# Patient Record
Sex: Female | Born: 2000 | Hispanic: Yes | Marital: Single | Smoking: Never smoker
Health system: Southern US, Community
[De-identification: ages and names within clinical notes are randomized; demographics above are authoritative.]

## PROBLEM LIST (undated history)

## (undated) ENCOUNTER — Inpatient Hospital Stay (HOSPITAL_COMMUNITY): Payer: Self-pay

## (undated) DIAGNOSIS — D649 Anemia, unspecified: Secondary | ICD-10-CM

## (undated) DIAGNOSIS — Z789 Other specified health status: Secondary | ICD-10-CM

## (undated) DIAGNOSIS — Z3490 Encounter for supervision of normal pregnancy, unspecified, unspecified trimester: Secondary | ICD-10-CM

## (undated) HISTORY — PX: NO PAST SURGERIES: SHX2092

## (undated) HISTORY — DX: Anemia, unspecified: D64.9

---
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
## 2018-11-27 ENCOUNTER — Other Ambulatory Visit: Payer: Self-pay

## 2018-11-27 ENCOUNTER — Encounter (HOSPITAL_COMMUNITY): Payer: Self-pay | Admitting: Emergency Medicine

## 2018-11-27 ENCOUNTER — Ambulatory Visit (HOSPITAL_COMMUNITY)
Admission: EM | Admit: 2018-11-27 | Discharge: 2018-11-27 | Disposition: A | Discharge status: Home / Self Care | Payer: Medicaid Other | Attending: Internal Medicine | Admitting: Internal Medicine

## 2018-11-27 DIAGNOSIS — A084 Viral intestinal infection, unspecified: Secondary | ICD-10-CM

## 2018-11-27 MED ORDER — ONDANSETRON 4 MG PO TBDP: 4.0000 mg | ORAL_TABLET | Freq: Three times a day (TID) | ORAL | 0 refills | Status: DC | PRN

## 2018-11-27 NOTE — ED Triage Notes (Signed)
Seen by dr Leonides Grills prior to clinical staff

## 2018-11-27 NOTE — ED Provider Notes (Signed)
[facility]    CSN: [id number] Arrival date & time: 11/27/18  1019     History   Chief Complaint Chief Complaint  Patient presents with  . Vomiting  . Diarrhea    HPI Catherine Nixon is a 18 y.o. female comes to urgent care with complaints of nausea, vomiting and diarrhea of 2 days duration.  Vomiting was nonbilious/nonbloody.  Diarrhea was nonbloody.  It was associated with some abdominal pain.  No fever or chills.  No relieving factors.  HPI  Past Medical History:  Diagnosis Date  . Medical history non-contributory     Patient Active Problem List   Diagnosis Date Noted  . Vaginal delivery 10/13/2017  . Post term pregnancy at [redacted] weeks gestation 10/12/2017    Past Surgical History:  Procedure Laterality Date  . NO PAST SURGERIES      OB History    Gravida  1   Para  1   Term  1   Preterm      AB      Living  1     SAB      TAB      Ectopic      Multiple  0   Live Births  1            Home Medications    Prior to Admission medications   Medication Sig Start Date End Date Taking? Authorizing Provider  ibuprofen (ADVIL,MOTRIN) 600 MG tablet Take 1 tablet (600 mg total) by mouth every 6 (six) hours. 10/15/17   Freddrick March, MD  IRON PO Take 1 tablet by mouth daily.     [provider]  ondansetron (ZOFRAN ODT) 4 MG disintegrating tablet Take 1 tablet (4 mg total) by mouth every 8 (eight) hours as needed for nausea or vomiting. 11/27/18   Blaiden Werth, Britta Mccreedy, MD    Family History Family History  Problem Relation Age of Onset  . Asthma Neg Hx   . Diabetes Neg Hx   . Heart disease Neg Hx   . Hypertension Neg Hx   . Stroke Neg Hx     Social History Social History   Tobacco Use  . Smoking status: Never Smoker  . Smokeless tobacco: Never Used  Substance Use Topics  . Alcohol use: No  . Drug use: No     Allergies   Patient has no known allergies.   Review of Systems Review of Systems  Eyes: Negative for  discharge, redness and itching.  Gastrointestinal: Positive for abdominal pain, diarrhea, nausea and vomiting. Negative for abdominal distention and constipation.  Genitourinary: Negative for dysuria, hematuria and urgency.  Musculoskeletal: Negative for arthralgias, gait problem and myalgias.  Neurological: Negative for dizziness, numbness and headaches.     Physical Exam Triage Vital Signs ED Triage Vitals  Enc Vitals Group     BP      Pulse      Resp      Temp      Temp src      SpO2      Weight      Height      Head Circumference      Peak Flow      Pain Score      Pain Loc      Pain Edu?      Excl. in GC?    No data found.  Updated Vital Signs BP (!) 94/56 (BP Location: Right Arm)   Pulse (!) 52  Temp 97.8 F (36.6 C) (Oral)   Resp 18   SpO2 100%   Visual Acuity Right Eye Distance:   Left Eye Distance:   Bilateral Distance:    Right Eye Near:   Left Eye Near:    Bilateral Near:     Physical Exam Cardiovascular:     Rate and Rhythm: Normal rate and regular rhythm.     Pulses: Normal pulses.     Heart sounds: Normal heart sounds.  Pulmonary:     Effort: Pulmonary effort is normal.     Breath sounds: Normal breath sounds.  Abdominal:     General: Abdomen is flat. There is no distension.     Tenderness: There is no abdominal tenderness. There is no guarding.  Musculoskeletal: Normal range of motion.  Skin:    General: Skin is warm.     Capillary Refill: Capillary refill takes less than 2 seconds.     Findings: No bruising, erythema or rash.      UC Treatments / Results  Labs (all labs ordered are listed, but only abnormal results are displayed) Labs Reviewed - No data to display  EKG None  Radiology No results found.  Procedures Procedures (including critical care time)  Medications Ordered in UC Medications - No data to display  Initial Impression / Assessment and Plan / UC Course  I have reviewed the triage vital signs and the  nursing notes.  Pertinent labs & imaging results that were available during my care of the patient were reviewed by me and considered in my medical decision making (see chart for details).     1.  Gastroenteritis-likely viral Zofran as needed for nausea or vomiting Encourage oral fluid intake   Final Clinical Impressions(s) / UC Diagnoses   Final diagnoses:  Viral gastroenteritis   Discharge Instructions   None    ED Prescriptions    Medication Sig Dispense Auth. Provider   ondansetron (ZOFRAN ODT) 4 MG disintegrating tablet  (Status: Discontinued) Take 1 tablet (4 mg total) by mouth every 8 (eight) hours as needed for nausea or vomiting. 20 tablet Chakita Mcgraw, Britta Mccreedy, MD   ondansetron (ZOFRAN ODT) 4 MG disintegrating tablet Take 1 tablet (4 mg total) by mouth every 8 (eight) hours as needed for nausea or vomiting. 20 tablet Lucetta Baehr, Britta Mccreedy, MD     Controlled Substance Prescriptions [place] Controlled Substance Registry consulted? Not Applicable   Merrilee Jansky, MD 11/27/18 [phone]

## 2019-06-18 ENCOUNTER — Ambulatory Visit (HOSPITAL_COMMUNITY)
Admission: EM | Admit: 2019-06-18 | Discharge: 2019-06-18 | Disposition: A | Discharge status: Home / Self Care | Payer: Medicaid Other | Attending: Nurse Practitioner | Admitting: Nurse Practitioner

## 2019-06-18 ENCOUNTER — Encounter (HOSPITAL_COMMUNITY): Payer: Self-pay

## 2019-06-18 ENCOUNTER — Telehealth (HOSPITAL_COMMUNITY): Payer: Self-pay | Admitting: Emergency Medicine

## 2019-06-18 ENCOUNTER — Other Ambulatory Visit: Payer: Self-pay

## 2019-06-18 DIAGNOSIS — R1013 Epigastric pain: Secondary | ICD-10-CM

## 2019-06-18 LAB — LIPASE, BLOOD: Lipase: 24 U/L (ref 11–51)

## 2019-06-18 LAB — COMPREHENSIVE METABOLIC PANEL
ALT: 15 U/L (ref 0–44)
AST: 19 U/L (ref 15–41)
Albumin: 3.6 g/dL (ref 3.5–5.0)
Alkaline Phosphatase: 93 U/L (ref 38–126)
Anion gap: 6 (ref 5–15)
BUN: 12 mg/dL (ref 6–20)
CO2: 26 mmol/L (ref 22–32)
Calcium: 8.7 mg/dL — ABNORMAL LOW (ref 8.9–10.3)
Chloride: 108 mmol/L (ref 98–111)
Creatinine, Ser: 0.44 mg/dL (ref 0.44–1.00)
GFR calc Af Amer: >60 mL/min (ref >60)
GFR calc non Af Amer: >60 mL/min (ref >60)
Glucose, Bld: 96 mg/dL (ref 70–99)
Potassium: 4 mmol/L (ref 3.5–5.1)
Sodium: 140 mmol/L (ref 135–145)
Total Bilirubin: 0.4 mg/dL (ref 0.3–1.2)
Total Protein: 6.5 g/dL (ref 6.5–8.1)

## 2019-06-18 MED ORDER — OMEPRAZOLE 40 MG PO CPDR: 40.0000 mg | DELAYED_RELEASE_CAPSULE | Freq: Two times a day (BID) | ORAL | 0 refills | Status: DC

## 2019-06-18 MED ORDER — LIDOCAINE VISCOUS HCL 2 % MT SOLN
15.0000 mL | Freq: Once | OROMUCOSAL | Status: AC
  Administered 2019-06-18: 11:00:00 15 mL via ORAL

## 2019-06-18 MED ORDER — CALCIUM CARBONATE ANTACID 500 MG PO CHEW: 1.0000 | CHEWABLE_TABLET | Freq: Four times a day (QID) | ORAL | 0 refills | Status: AC | PRN

## 2019-06-18 MED ORDER — LIDOCAINE VISCOUS HCL 2 % MT SOLN
OROMUCOSAL | Status: AC
  Filled 2019-06-18: qty 15

## 2019-06-18 MED ORDER — ALUM & MAG HYDROXIDE-SIMETH 200-200-20 MG/5ML PO SUSP
30.0000 mL | Freq: Once | ORAL | Status: AC
  Administered 2019-06-18: 30 mL via ORAL

## 2019-06-18 MED ORDER — ONDANSETRON 4 MG PO TBDP
ORAL_TABLET | ORAL | Status: AC
  Filled 2019-06-18: qty 1

## 2019-06-18 MED ORDER — ALUM & MAG HYDROXIDE-SIMETH 200-200-20 MG/5ML PO SUSP
ORAL | Status: AC
  Filled 2019-06-18: qty 30

## 2019-06-18 NOTE — ED Triage Notes (Signed)
Pt presents with recurrent epigastric abdominal pain that has been intermittent over a year.

## 2019-06-18 NOTE — ED Provider Notes (Signed)
[facility]    CSN: [id number] Arrival date & time: 06/18/19  1014      History   Chief Complaint Chief Complaint  Patient presents with   Abdominal Pain    HPI Elwin Mochalma Donu-Perez is a 18 y.o. female.   Subjective:   Spanish Translator: [phone]   Elwin Mochalma Donu-Perez is a 18 y.o. female who presents for evaluation of abdominal pain. The pain is described as pressure-like and is 6/10 in intensity. Pain is located in the epigastric with radiation to the right chest. Onset was 1 year ago and has been more constant recently.  Aggravating factors: eating.  Alleviating factors: cold liquids. She tried eating yogurt and stopped eating spicy foods for a short while without any change in symptoms. She hasn't had any prior evaluation for this. Associated symptoms: none. The patient denies anorexia, arthralgias, chills, constipation, diarrhea, dysuria, fever, flatus, frequency, headache, hematochezia, hematuria, melena, myalgias, nausea, sweats, back pain, flank pain, chest pain, shortness of breath, palpitations and vomiting.   The following portions of the patient's history were reviewed and updated as appropriate: allergies, current medications, past family history, past medical history, past social history, past surgical history and problem list.        Past Medical History:  Diagnosis Date   Medical history non-contributory     Patient Active Problem List   Diagnosis Date Noted   Vaginal delivery 10/13/2017   Post term pregnancy at [redacted] weeks gestation 10/12/2017    Past Surgical History:  Procedure Laterality Date   NO PAST SURGERIES      OB History    Gravida  1   Para  1   Term  1   Preterm      AB      Living  1     SAB      TAB      Ectopic      Multiple  0   Live Births  1            Home Medications    Prior to Admission medications   Medication Sig Start Date End Date Taking? Authorizing Provider  calcium carbonate (TUMS) 500  MG chewable tablet Chew 1 tablet (200 mg of elemental calcium total) by mouth 4 (four) times daily as needed for indigestion or heartburn. 06/18/19 07/18/19  Lurline IdolMurrill, Brahim Dolman, FNP  ibuprofen (ADVIL,MOTRIN) 600 MG tablet Take 1 tablet (600 mg total) by mouth every 6 (six) hours. 10/15/17   Freddrick MarchAmin, Yashika, MD  IRON PO Take 1 tablet by mouth daily.     [provider]  omeprazole (PRILOSEC) 40 MG capsule Take 1 capsule (40 mg total) by mouth 2 (two) times daily before a meal. 06/18/19 07/18/19  Lurline IdolMurrill, Maximino Cozzolino, FNP  ondansetron (ZOFRAN ODT) 4 MG disintegrating tablet Take 1 tablet (4 mg total) by mouth every 8 (eight) hours as needed for nausea or vomiting. 11/27/18   Lamptey, Britta MccreedyPhilip O, MD    Family History Family History  Problem Relation Age of Onset   Asthma Neg Hx    Diabetes Neg Hx    Heart disease Neg Hx    Hypertension Neg Hx    Stroke Neg Hx     Social History Social History   Tobacco Use   Smoking status: Never Smoker   Smokeless tobacco: Never Used  Substance Use Topics   Alcohol use: No   Drug use: No     Allergies   Patient has no known allergies.  Review of Systems Review of Systems  Constitutional: Negative for appetite change and fever.  Respiratory: Negative for cough and shortness of breath.   Cardiovascular: Negative for chest pain and palpitations.  Gastrointestinal: Positive for abdominal pain. Negative for abdominal distention, blood in stool, constipation, diarrhea, nausea, rectal pain and vomiting.  Genitourinary: Negative for dysuria.  All other systems reviewed and are negative.    Physical Exam Triage Vital Signs ED Triage Vitals [06/18/19 1033]  Enc Vitals Group     BP 108/62     Pulse Rate 61     Resp 17     Temp 98.4 F (36.9 C)     Temp Source Oral     SpO2 100 %     Weight      Height      Head Circumference      Peak Flow      Pain Score 6     Pain Loc      Pain Edu?      Excl. in [place]?    No data  found.  Updated Vital Signs BP 108/62 (BP Location: Right Arm)    Pulse 61    Temp 98.4 F (36.9 C) (Oral)    Resp 17    LMP 05/17/2019    SpO2 100%   Visual Acuity Right Eye Distance:   Left Eye Distance:   Bilateral Distance:    Right Eye Near:   Left Eye Near:    Bilateral Near:     Physical Exam Vitals signs reviewed.  Constitutional:      Appearance: She is well-developed. She is not ill-appearing.  HENT:     Head: Normocephalic.  Cardiovascular:     Rate and Rhythm: Normal rate and regular rhythm.  Pulmonary:     Effort: Pulmonary effort is normal.  Abdominal:     General: Abdomen is flat. Bowel sounds are normal. There is no distension.     Palpations: Abdomen is soft.     Tenderness: There is no abdominal tenderness.  Skin:    General: Skin is warm and dry.  Neurological:     General: No focal deficit present.     Mental Status: She is alert and oriented to person, place, and time.  Psychiatric:        Mood and Affect: Mood normal.      UC Treatments / Results  Labs (all labs ordered are listed, but only abnormal results are displayed) Labs Reviewed  COMPREHENSIVE METABOLIC PANEL  LIPASE, BLOOD  H. PYLORI ANTIBODY, IGG    EKG   Radiology No results found.  Procedures Procedures (including critical care time)  Medications Ordered in UC Medications  alum & mag hydroxide-simeth (MAALOX/MYLANTA) 200-200-20 MG/5ML suspension 30 mL (30 mLs Oral Given 06/18/19 1126)    And  lidocaine (XYLOCAINE) 2 % viscous mouth solution 15 mL (15 mLs Oral Given 06/18/19 1126)  alum & mag hydroxide-simeth (MAALOX/MYLANTA) 200-200-20 MG/5ML suspension (has no administration in time range)  lidocaine (XYLOCAINE) 2 % viscous mouth solution (has no administration in time range)  ondansetron (ZOFRAN-ODT) 4 MG disintegrating tablet (has no administration in time range)    Initial Impression / Assessment and Plan / UC Course  I have reviewed the triage vital signs and the  nursing notes.  Pertinent labs & imaging results that were available during my care of the patient were reviewed by me and considered in my medical decision making (see chart for details).     18 yo  female with significant medical history that presents with a one-year history of epigastric pain. She denies any associating symptoms. She hasn't tried anything for her symptoms. No prior evaluation. VSS. Physical exam unremarkable.   Plan: Check CMP, lipase and H. Pylori  GI cocktail given in clinic  Trial omeprazole BID (see orders) Calcium carbonate PRN  (see orders)  Further follow-up plans will be based on outcome of lab/imaging studies; see orders. Go to ED immediately for any worsening symptoms   Today's evaluation has revealed no signs of a dangerous process. Discussed diagnosis with patient and/or guardian. Patient and/or guardian aware of their diagnosis, possible red flag symptoms to watch out for and need for close follow up. Patient and/or guardian understands verbal and written discharge instructions. Patient and/or guardian comfortable with plan and disposition.  Patient and/or guardian has a clear mental status at this time, good insight into illness (after discussion and teaching) and has clear judgment to make decisions regarding their care  This care was provided during an unprecedented National Emergency due to the Novel Coronavirus (COVID-19) pandemic. COVID-19 infections and transmission risks place heavy strains on healthcare resources.  As this pandemic evolves, our facility, providers, and staff strive to respond fluidly, to remain operational, and to provide care relative to available resources and information. Outcomes are unpredictable and treatments are without well-defined guidelines. Further, the impact of COVID-19 on all aspects of urgent care, including the impact to patients seeking care for reasons other than COVID-19, is unavoidable during this national emergency. At  this time of the global pandemic, management of patients has significantly changed, even for non-COVID positive patients given high local and regional COVID volumes at this time requiring high healthcare system and resource utilization. The standard of care for management of both COVID suspected and non-COVID suspected patients continues to change rapidly at the local, regional, national, and global levels. This patient was worked up and treated to the best available but ever changing evidence and resources available at this current time.   Documentation was completed with the aid of voice recognition software. Transcription may contain typographical errors.  Final Clinical Impressions(s) / UC Diagnoses   Final diagnoses:  Abdominal pain, epigastric     Discharge Instructions     1. Take medications as prescribed  2. Lab results are pending; you may check MyChart in a couple of days to review the results  3. Read information attached  4. Follow-up with GI if no improvement in your symptoms  5. Go to the ED immediately if any of your symptoms get worse     ED Prescriptions    Medication Sig Dispense Auth. Provider   omeprazole (PRILOSEC) 40 MG capsule Take 1 capsule (40 mg total) by mouth 2 (two) times daily before a meal. 60 capsule Lurline Idol, FNP   calcium carbonate (TUMS) 500 MG chewable tablet Chew 1 tablet (200 mg of elemental calcium total) by mouth 4 (four) times daily as needed for indigestion or heartburn. 30 tablet Lurline Idol, FNP     Controlled Substance Prescriptions [place] Controlled Substance Registry consulted? Not Applicable   Lurline Idol, FNP 06/18/19 1137

## 2019-06-18 NOTE — Discharge Instructions (Addendum)
Take medications as prescribed  Lab results are pending; you may check MyChart in a couple of days to review the results  Read information attached  Follow-up with GI if no improvement in your symptoms  Go to the ED immediately if any of your symptoms get worse

## 2019-06-18 NOTE — Telephone Encounter (Signed)
No significant abnormalities. Patient contacted and made aware of    results, all questions answered

## 2019-06-19 LAB — H. PYLORI ANTIBODY, IGG: H Pylori IgG: 0.2 Index Value (ref 0.00–0.79)

## 2019-09-08 ENCOUNTER — Encounter (HOSPITAL_COMMUNITY): Payer: Self-pay

## 2019-09-08 ENCOUNTER — Other Ambulatory Visit: Payer: Self-pay

## 2019-09-08 ENCOUNTER — Ambulatory Visit (HOSPITAL_COMMUNITY)
Admission: EM | Admit: 2019-09-08 | Discharge: 2019-09-08 | Disposition: A | Discharge status: Home / Self Care | Payer: Medicaid Other

## 2019-09-08 DIAGNOSIS — R221 Localized swelling, mass and lump, neck: Secondary | ICD-10-CM

## 2019-09-08 NOTE — Discharge Instructions (Addendum)
This is most likely some sort of cyst or abscess under the skin. These usually come from infected hair follicles or dead skin cells. You can try a warm rag to the area a few times a day to see if this helps. Try some antibiotic ointment on the area. If the are become more painful, red or more swollen please let us know.

## 2019-09-08 NOTE — ED Provider Notes (Addendum)
[place]    CSN: [id number] Arrival date & time: 09/08/19  1009      History   Chief Complaint Chief Complaint  Patient presents with  . Abscess    HPI Catherine Nixon is a 18 y.o. female.   Patient is a 18 year old female presents today with nodule to the lateral posterior neck near hairline.  This has been present for the past 15 days.  Symptoms have been constant.  The area is tender to touch.  No redness, significant swelling, drainage.  No injuries to the neck or midline pain.  No fevers.  ROS per HPI       Past Medical History:  Diagnosis Date  . Medical history non-contributory     Patient Active Problem List   Diagnosis Date Noted  . Vaginal delivery 10/13/2017  . Post term pregnancy at [redacted] weeks gestation 10/12/2017    Past Surgical History:  Procedure Laterality Date  . NO PAST SURGERIES      OB History    Gravida  1   Para  1   Term  1   Preterm      AB      Living  1     SAB      TAB      Ectopic      Multiple  0   Live Births  1            Home Medications    Prior to Admission medications   Medication Sig Start Date End Date Taking? Authorizing Provider  ibuprofen (ADVIL,MOTRIN) 600 MG tablet Take 1 tablet (600 mg total) by mouth every 6 (six) hours. 10/15/17   Lovenia Kim, MD  IRON PO Take 1 tablet by mouth daily.     [provider]  omeprazole (PRILOSEC) 40 MG capsule Take 1 capsule (40 mg total) by mouth 2 (two) times daily before a meal. 06/18/19 07/18/19  Enrique Sack, FNP  ondansetron (ZOFRAN ODT) 4 MG disintegrating tablet Take 1 tablet (4 mg total) by mouth every 8 (eight) hours as needed for nausea or vomiting. 11/27/18   Lamptey, Myrene Galas, MD    Family History Family History  Problem Relation Age of Onset  . Asthma Neg Hx   . Diabetes Neg Hx   . Heart disease Neg Hx   . Hypertension Neg Hx   . Stroke Neg Hx     Social History Social History   Tobacco Use  . Smoking  status: Never Smoker  . Smokeless tobacco: Never Used  Substance Use Topics  . Alcohol use: No  . Drug use: No     Allergies   Patient has no known allergies.   Review of Systems Review of Systems   Physical Exam Triage Vital Signs ED Triage Vitals  Enc Vitals Group     BP 09/08/19 1037 101/68     Pulse Rate 09/08/19 1037 68     Resp 09/08/19 1037 16     Temp 09/08/19 1037 98.4 F (36.9 C)     Temp src --      SpO2 09/08/19 1037 100 %     Weight 09/08/19 1035 150 lb (68 kg)     Height --      Head Circumference --      Peak Flow --      Pain Score 09/08/19 1035 6     Pain Loc --      Pain Edu? --  Excl. in [place]? --    No data found.  Updated Vital Signs BP 101/68 (BP Location: Right Arm)   Pulse 68   Temp 98.4 F (36.9 C)   Resp 16   Wt 150 lb (68 kg)   LMP 08/18/2019   SpO2 100%   BMI 33.63 kg/m   Visual Acuity Right Eye Distance:   Left Eye Distance:   Bilateral Distance:    Right Eye Near:   Left Eye Near:    Bilateral Near:     Physical Exam Vitals signs and nursing note reviewed.  Constitutional:      General: She is not in acute distress.    Appearance: Normal appearance. She is not ill-appearing, toxic-appearing or diaphoretic.  HENT:     Head: Normocephalic.     Nose: Nose normal.     Mouth/Throat:     Pharynx: Oropharynx is clear.  Eyes:     Conjunctiva/sclera: Conjunctivae normal.  Neck:     Musculoskeletal: Normal range of motion.      Comments: Very small pea-sized nodule, movable palpated to posterior left lateral neck near hairline.  No redness, swelling or drainage.  Mildly tender to touch. Pulmonary:     Effort: Pulmonary effort is normal.  Musculoskeletal: Normal range of motion.  Skin:    General: Skin is warm and dry.     Findings: No rash.  Neurological:     Mental Status: She is alert.  Psychiatric:        Mood and Affect: Mood normal.      UC Treatments / Results  Labs (all labs ordered are listed, but  only abnormal results are displayed) Labs Reviewed - No data to display  EKG   Radiology No results found.  Procedures Procedures (including critical care time)  Medications Ordered in UC Medications - No data to display  Initial Impression / Assessment and Plan / UC Course  I have reviewed the triage vital signs and the nursing notes.  Pertinent labs & imaging results that were available during my care of the patient were reviewed by me and considered in my medical decision making (see chart for details).     Lump to posterior neck-the nodule is movable, tiny, pea shaped.  Mildly tender No signs of infection. Not visible.  Possible small cyst or abscess forming. Recommended warm compresses Less likely this is self-limiting.   Follow up as needed for continued or worsening symptoms  Final Clinical Impressions(s) / UC Diagnoses   Final diagnoses:  Lump on neck     Discharge Instructions     This is most likely some sort of cyst or abscess under the skin. These usually come from infected hair follicles or dead skin cells. You can try a warm rag to the area a few times a day to see if this helps. Try some antibiotic ointment on the area. If the are become more painful, red or more swollen please let us know.     ED Prescriptions    None     PDMP not reviewed this encounter.   Janace Aris, NP 09/08/19 1419    Dahlia Byes A, NP 09/08/19 1419

## 2019-09-08 NOTE — ED Triage Notes (Signed)
Pt states she has boil in the back of her head in her hairline on the left side. Pt states sit's been there 15 days.

## 2019-12-24 ENCOUNTER — Other Ambulatory Visit: Payer: Self-pay

## 2019-12-24 ENCOUNTER — Emergency Department (HOSPITAL_COMMUNITY): Payer: Medicaid Other

## 2019-12-24 ENCOUNTER — Emergency Department (HOSPITAL_COMMUNITY)
Admission: EM | Admit: 2019-12-24 | Discharge: 2019-12-25 | Disposition: A | Discharge status: Home / Self Care | Payer: Medicaid Other | Attending: Emergency Medicine | Admitting: Emergency Medicine

## 2019-12-24 ENCOUNTER — Encounter (HOSPITAL_COMMUNITY): Payer: Self-pay | Admitting: Emergency Medicine

## 2019-12-24 DIAGNOSIS — Y998 Other external cause status: Secondary | ICD-10-CM | POA: Insufficient documentation

## 2019-12-24 DIAGNOSIS — M79651 Pain in right thigh: Secondary | ICD-10-CM | POA: Insufficient documentation

## 2019-12-24 DIAGNOSIS — S99911A Unspecified injury of right ankle, initial encounter: Secondary | ICD-10-CM | POA: Diagnosis present

## 2019-12-24 DIAGNOSIS — S8991XA Unspecified injury of right lower leg, initial encounter: Secondary | ICD-10-CM

## 2019-12-24 DIAGNOSIS — Y92018 Other place in single-family (private) house as the place of occurrence of the external cause: Secondary | ICD-10-CM | POA: Insufficient documentation

## 2019-12-24 DIAGNOSIS — S93491A Sprain of other ligament of right ankle, initial encounter: Secondary | ICD-10-CM | POA: Insufficient documentation

## 2019-12-24 DIAGNOSIS — W108XXA Fall (on) (from) other stairs and steps, initial encounter: Secondary | ICD-10-CM | POA: Insufficient documentation

## 2019-12-24 DIAGNOSIS — M25561 Pain in right knee: Secondary | ICD-10-CM | POA: Diagnosis not present

## 2019-12-24 DIAGNOSIS — W19XXXA Unspecified fall, initial encounter: Secondary | ICD-10-CM

## 2019-12-24 DIAGNOSIS — R52 Pain, unspecified: Secondary | ICD-10-CM

## 2019-12-24 DIAGNOSIS — S79921A Unspecified injury of right thigh, initial encounter: Secondary | ICD-10-CM | POA: Diagnosis not present

## 2019-12-24 DIAGNOSIS — Y9301 Activity, walking, marching and hiking: Secondary | ICD-10-CM | POA: Insufficient documentation

## 2019-12-24 DIAGNOSIS — M25571 Pain in right ankle and joints of right foot: Secondary | ICD-10-CM | POA: Diagnosis not present

## 2019-12-24 DIAGNOSIS — M7989 Other specified soft tissue disorders: Secondary | ICD-10-CM | POA: Diagnosis not present

## 2019-12-24 DIAGNOSIS — S79912A Unspecified injury of left hip, initial encounter: Secondary | ICD-10-CM | POA: Diagnosis not present

## 2019-12-24 LAB — CBC WITH DIFFERENTIAL/PLATELET
Abs Immature Granulocytes: 0.01 10*3/uL (ref 0.00–0.07)
Basophils Absolute: 0.1 10*3/uL (ref 0.0–0.1)
Basophils Relative: 1 %
Eosinophils Absolute: 0.3 10*3/uL (ref 0.0–0.5)
Eosinophils Relative: 3 %
HCT: 40.8 % (ref 36.0–46.0)
Hemoglobin: 13.1 g/dL (ref 12.0–15.0)
Immature Granulocytes: 0 %
Lymphocytes Relative: 31 %
Lymphs Abs: 2.4 10*3/uL (ref 0.7–4.0)
MCH: 27.9 pg (ref 26.0–34.0)
MCHC: 32.1 g/dL (ref 30.0–36.0)
MCV: 87 fL (ref 80.0–100.0)
Monocytes Absolute: 0.5 10*3/uL (ref 0.1–1.0)
Monocytes Relative: 6 %
Neutro Abs: 4.5 10*3/uL (ref 1.7–7.7)
Neutrophils Relative %: 59 %
Platelets: 284 10*3/uL (ref 150–400)
RBC: 4.69 MIL/uL (ref 3.87–5.11)
RDW: 13 % (ref 11.5–15.5)
WBC: 7.7 10*3/uL (ref 4.0–10.5)
nRBC: 0 % (ref 0.0–0.2)

## 2019-12-24 LAB — BASIC METABOLIC PANEL
Anion gap: 10 (ref 5–15)
BUN: 12 mg/dL (ref 6–20)
CO2: 24 mmol/L (ref 22–32)
Calcium: 9.1 mg/dL (ref 8.9–10.3)
Chloride: 105 mmol/L (ref 98–111)
Creatinine, Ser: 0.52 mg/dL (ref 0.44–1.00)
GFR calc Af Amer: >60 mL/min (ref >60)
GFR calc non Af Amer: >60 mL/min (ref >60)
Glucose, Bld: 99 mg/dL (ref 70–99)
Potassium: 3.4 mmol/L — ABNORMAL LOW (ref 3.5–5.1)
Sodium: 139 mmol/L (ref 135–145)

## 2019-12-24 LAB — I-STAT BETA HCG BLOOD, ED (MC, WL, AP ONLY): I-stat hCG, quantitative: <5 m[IU]/mL (ref <5)

## 2019-12-24 LAB — URINALYSIS, ROUTINE W REFLEX MICROSCOPIC
Bilirubin Urine: NEGATIVE
Glucose, UA: NEGATIVE mg/dL
Ketones, ur: NEGATIVE mg/dL
Leukocytes,Ua: NEGATIVE
Nitrite: POSITIVE — AB
Protein, ur: NEGATIVE mg/dL
Specific Gravity, Urine: 1.024 (ref 1.005–1.030)
pH: 5 (ref 5.0–8.0)

## 2019-12-24 MED ORDER — OXYCODONE-ACETAMINOPHEN 5-325 MG PO TABS
1.0000 | ORAL_TABLET | ORAL | Status: DC | PRN
  Administered 2019-12-24: 22:00:00 1 via ORAL
  Filled 2019-12-24: qty 1

## 2019-12-24 NOTE — ED Triage Notes (Signed)
  Patient comes in with R hip pain after falling down 10 steps at apartment.  Patients it is worst pain ever and is unable to bear weight.  CNS intact, patient unable wiggle toes.  Pain 10/10.

## 2019-12-25 NOTE — ED Provider Notes (Signed)
[facility] EMERGENCY DEPARTMENT Provider Note  CSN: [id number] Arrival date & time: 12/24/19 2150   Chief Complaint(s) Fall and Hip Pain  HPI Catherine Nixon is a 19 y.o. female who presents to the emergency department after a fall down a flight of stairs.  This occurred around 8 PM last night.  She reports that she twisted her ankle on the way down causing her to fall.  She denies any head trauma or loss of consciousness.  She is endorsing right leg pain from the thigh to the ankle.  Pain worse with ambulation, palpation and range of motion.  Alleviated by mobility.  She denies any headache, neck pain, back pain, chest pain, upper extremity pain or left lower extremity pain.  No other physical complaints.  HPI  Past Medical History Past Medical History:  Diagnosis Date  . Medical history non-contributory    Patient Active Problem List   Diagnosis Date Noted  . Vaginal delivery 10/13/2017  . Post term pregnancy at [redacted] weeks gestation 10/12/2017   Home Medication(s) Prior to Admission medications   Medication Sig Start Date End Date Taking? Authorizing Provider  ibuprofen (ADVIL,MOTRIN) 600 MG tablet Take 1 tablet (600 mg total) by mouth every 6 (six) hours. 10/15/17   Freddrick March, MD  IRON PO Take 1 tablet by mouth daily.     [provider]  omeprazole (PRILOSEC) 40 MG capsule Take 1 capsule (40 mg total) by mouth 2 (two) times daily before a meal. 06/18/19 07/18/19  Lurline Idol, FNP  ondansetron (ZOFRAN ODT) 4 MG disintegrating tablet Take 1 tablet (4 mg total) by mouth every 8 (eight) hours as needed for nausea or vomiting. 11/27/18   Lamptey, Britta Mccreedy, MD                                                                                                                                    Past Surgical History Past Surgical History:  Procedure Laterality Date  . NO PAST SURGERIES     Family History Family History  Problem Relation Age of Onset  .  Asthma Neg Hx   . Diabetes Neg Hx   . Heart disease Neg Hx   . Hypertension Neg Hx   . Stroke Neg Hx     Social History Social History   Tobacco Use  . Smoking status: Never Smoker  . Smokeless tobacco: Never Used  Substance Use Topics  . Alcohol use: No  . Drug use: No   Allergies Patient has no known allergies.  Review of Systems Review of Systems All other systems are reviewed and are negative for acute change except as noted in the HPI  Physical Exam Vital Signs  I have reviewed the triage vital signs BP 99/63   Pulse 80   Temp 98.6 F (37 C) (Oral)   Resp 16   Ht 4\' 10"  (1.473 m)   Wt 65 kg  LMP 11/23/2019   SpO2 100%   BMI 29.95 kg/m   Physical Exam Constitutional:      General: She is not in acute distress.    Appearance: She is well-developed. She is not diaphoretic.  HENT:     Head: Normocephalic and atraumatic.     Right Ear: External ear normal.     Left Ear: External ear normal.     Nose: Nose normal.  Eyes:     General: No scleral icterus.       Right eye: No discharge.        Left eye: No discharge.     Conjunctiva/sclera: Conjunctivae normal.     Pupils: Pupils are equal, round, and reactive to light.  Cardiovascular:     Rate and Rhythm: Normal rate and regular rhythm.     Pulses:          Radial pulses are 2+ on the right side and 2+ on the left side.       Dorsalis pedis pulses are 2+ on the right side and 2+ on the left side.     Heart sounds: Normal heart sounds. No murmur. No friction rub. No gallop.   Pulmonary:     Effort: Pulmonary effort is normal. No respiratory distress.     Breath sounds: Normal breath sounds. No stridor. No wheezing.  Abdominal:     General: There is no distension.     Palpations: Abdomen is soft.     Tenderness: There is no abdominal tenderness.  Musculoskeletal:     Cervical back: Normal range of motion and neck supple. No bony tenderness.     Thoracic back: No bony tenderness.     Lumbar back: No  bony tenderness.     Right hip: No deformity or tenderness. Normal range of motion.     Left hip: No deformity or tenderness. Normal range of motion.     Right upper leg: Tenderness present.     Left upper leg: No tenderness.     Right knee: Tenderness present.     Left knee: No tenderness.     Right lower leg: Tenderness present. No deformity.     Left lower leg: No deformity or tenderness.     Right ankle: Swelling present. No deformity. Tenderness present over the medial malleolus, ATF ligament and AITF ligament. No base of 5th metatarsal tenderness. Decreased range of motion. Normal pulse.     Left ankle: No deformity. No tenderness. Normal range of motion.     Comments: Clavicles stable. Chest stable to AP/Lat compression. Pelvis stable to Lat compression. No obvious extremity deformity. No chest or abdominal wall contusion.  Skin:    General: Skin is warm and dry.     Findings: No erythema or rash.  Neurological:     Mental Status: She is alert and oriented to person, place, and time.     Comments: Moving all extremities     ED Results and Treatments Labs (all labs ordered are listed, but only abnormal results are displayed) Labs Reviewed  BASIC METABOLIC PANEL - Abnormal; Notable for the following components:      Result Value   Potassium 3.4 (*)    All other components within normal limits  URINALYSIS, ROUTINE W REFLEX MICROSCOPIC - Abnormal; Notable for the following components:   Hgb urine dipstick SMALL (*)    Nitrite POSITIVE (*)    Bacteria, UA MANY (*)    All other components within normal limits  CBC  WITH DIFFERENTIAL/PLATELET  I-STAT BETA HCG BLOOD, ED ([facility], [facility], [facility] ONLY)                                                                                                                         EKG  EKG Interpretation  Date/Time:    Ventricular Rate:    PR Interval:    QRS Duration:   QT Interval:    QTC Calculation:   R Axis:     Text Interpretation:         Radiology DG Tibia/Fibula Right  Result Date: 12/24/2019 CLINICAL DATA:  Pain status post fall EXAM: RIGHT TIBIA AND FIBULA - 2 VIEW COMPARISON:  None. FINDINGS: There is no evidence of fracture or other focal bone lesions. There is soft tissue swelling about the lateral malleolus, better appreciated on associated ankle radiograph. IMPRESSION: Negative. Electronically Signed   By: Katherine Mantle M.D.   On: 12/24/2019 23:52   DG Ankle Complete Right  Result Date: 12/24/2019 CLINICAL DATA:  Pain status post fall EXAM: RIGHT ANKLE - COMPLETE 3+ VIEW COMPARISON:  None. FINDINGS: There is soft tissue swelling about the lateral malleolus. There is a small joint effusion. There is no acute displaced fracture or dislocation. IMPRESSION: Soft tissue swelling about the lateral malleolus without evidence for an acute displaced fracture or dislocation. Electronically Signed   By: Katherine Mantle M.D.   On: 12/24/2019 23:50   DG Knee Complete 4 Views Right  Result Date: 12/24/2019 CLINICAL DATA:  Knee pain status post fall EXAM: RIGHT KNEE - COMPLETE 4+ VIEW COMPARISON:  None. FINDINGS: No evidence of fracture, dislocation, or joint effusion. No evidence of arthropathy or other focal bone abnormality. Soft tissues are unremarkable. IMPRESSION: Negative. Electronically Signed   By: Katherine Mantle M.D.   On: 12/24/2019 23:51   DG HIP UNILAT WITH PELVIS 2-3 VIEWS LEFT  Result Date: 12/24/2019 CLINICAL DATA:  Pain status post fall EXAM: DG HIP (WITH OR WITHOUT PELVIS) 2-3V LEFT COMPARISON:  None. FINDINGS: There is no evidence of hip fracture or dislocation. There is no evidence of arthropathy or other focal bone abnormality. IMPRESSION: Negative. Electronically Signed   By: Katherine Mantle M.D.   On: 12/24/2019 23:51   DG FEMUR, MIN 2 VIEWS RIGHT  Result Date: 12/24/2019 CLINICAL DATA:  Pain status post fall EXAM: RIGHT FEMUR 2 VIEWS COMPARISON:  None. FINDINGS: There is no evidence of  fracture or other focal bone lesions. Soft tissues are unremarkable. IMPRESSION: Negative. Electronically Signed   By: Katherine Mantle M.D.   On: 12/24/2019 23:49    Pertinent labs & imaging results that were available during my care of the patient were reviewed by me and considered in my medical decision making (see chart for details).  Medications Ordered in ED Medications  oxyCODONE-acetaminophen (PERCOCET/ROXICET) 5-325 MG per tablet 1 tablet (1 tablet Oral Given 12/24/19 2214)  Procedures Procedures  (including critical care time)  Medical Decision Making / ED Course I have reviewed the nursing notes for this encounter and the patient's prior records (if available in EHR or on provided paperwork).   Brit Carbonell was evaluated in Emergency Department on 12/25/2019 for the symptoms described in the history of present illness. She was evaluated in the context of the global COVID-19 pandemic, which necessitated consideration that the patient might be at risk for infection with the SARS-CoV-2 virus that causes COVID-19. Institutional protocols and algorithms that pertain to the evaluation of patients at risk for COVID-19 are in a state of rapid change based on information released by regulatory bodies including the CDC and federal and state organizations. These policies and algorithms were followed during the patient's care in the ED.  Labs drawn in triage are grossly reassuring.  Plain films negative for any acute fracture or dislocation.   Provided with a cam walker and crutches.      Final Clinical Impression(s) / ED Diagnoses Final diagnoses:  Fall, initial encounter  Injury of right lower extremity, initial encounter  Sprain of anterior talofibular ligament of right ankle, initial encounter   The patient appears reasonably screened and/or  stabilized for discharge and I doubt any other medical condition or other [facility] requiring further screening, evaluation, or treatment in the ED at this time prior to discharge. Safe for discharge with strict return precautions.  Disposition: Discharge  Condition: Good  I have discussed the results, Dx and Tx plan with the patient/family who expressed understanding and agree(s) with the plan. Discharge instructions discussed at length. The patient/family was given strict return precautions who verbalized understanding of the instructions. No further questions at time of discharge.    ED Discharge Orders    None      Follow Up: Dene Gentry, MD [address] [place] [address] [phone]  Schedule an appointment as soon as possible for a visit         This chart was dictated using voice recognition software.  Despite best efforts to proofread,  errors can occur which can change the documentation meaning.   Fatima Blank, MD 12/25/19 [phone]

## 2019-12-25 NOTE — ED Notes (Signed)
Discharge instructions discussed with pt. Pt verbalized understanding with no questions at this time. Pt to go home with sister 

## 2019-12-25 NOTE — Progress Notes (Signed)
Orthopedic Tech Progress Note Patient Details:  Catherine Nixon [birth year] [id number]  Ortho Devices Type of Ortho Device: Crutches, CAM walker Ortho Device/Splint Location: RLE Ortho Device/Splint Interventions: Ordered, Application, Adjustment   Post Interventions Patient Tolerated: Fair Instructions Provided: Care of device, Poper ambulation with device, Adjustment of device   Catherine Nixon N Catherine Nixon 12/25/2019, 4:02 AM

## 2019-12-25 NOTE — ED Notes (Signed)
Ortho paged for cam walker and crutches 

## 2019-12-25 NOTE — Discharge Instructions (Signed)
Please limit acetaminophen (Tylenol) to 4000 mg and Ibuprofen (Motrin, Advil, etc.) to 2400 mg for a 24hr period. Please note that other over-the-counter medicine may contain acetaminophen or ibuprofen as a component of their ingredients.   Limite el acetaminofn (Tylenol) a 4000 mg y el ibuprofeno (Motrin, Advil, etc.) a 2400 mg durante un perodo de 24 horas. Tenga en cuenta que otros medicamentos de venta libre pueden contener acetaminofn o ibuprofeno como componente de sus ingredientes.

## 2019-12-29 ENCOUNTER — Encounter: Payer: Self-pay | Admitting: Sports Medicine

## 2019-12-29 ENCOUNTER — Ambulatory Visit (INDEPENDENT_AMBULATORY_CARE_PROVIDER_SITE_OTHER): Payer: Medicaid Other | Admitting: Sports Medicine

## 2019-12-29 ENCOUNTER — Other Ambulatory Visit: Payer: Self-pay

## 2019-12-29 VITALS — BP 92/60 | Ht <= 58 in | Wt 105.0 lb

## 2019-12-29 DIAGNOSIS — S93401A Sprain of unspecified ligament of right ankle, initial encounter: Secondary | ICD-10-CM | POA: Diagnosis not present

## 2019-12-29 NOTE — Progress Notes (Signed)
Milly was the interpreter for today's visit.

## 2019-12-29 NOTE — Progress Notes (Signed)
   [facility] [address] [place], [place] [address] Phone: [phone] Fax: [phone]   Patient Name: Catherine Nixon Date of Birth: 2001/04/28 Medical Record Number: [id number] Gender: female Date of Encounter: 12/29/2019  SUBJECTIVE:      Chief Complaint:  Right ankle pain   HPI:  Spanish interpreter utilized today.  Catherine Nixon is a 19 year old F presenting after falling down a flight of stairs 5 days ago.  She went to the emergency department that night where she had XR of lower extremity that did not show any fracture.  She was sent home in a cam walker boot and crutches, that she has been using since the fall.  She is using ibuprofen 600 mg as needed.  SHe has never injured the ankle before.  She has not started any rehab exercises.  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  She denies any numbness or tingling into her toes.     ROS:     See HPI.   PERTINENT  PMH / PSH / FH / SH:  Past Medical, Surgical, Social, and Family History Reviewed & Updated in the EMR. Pertinent findings include:  None   OBJECTIVE:  BP 92/60   Ht 4\' 7"  (1.397 m)   Wt 105 lb (47.6 kg)   BMI 24.40 kg/m  Physical Exam:  Vital signs are reviewed.   GEN: Alert and oriented, NAD Pulm: Breathing unlabored PSY: normal mood, congruent affect  MSK: Right ankle Mild lateral swelling FROM in PF, mild decrease in DF TTP along ATFL Positive ant drawer and talar tilt.   Negative syndesmotic compression. Positive Klieger's Thompsons test negative. NV intact distally.  Left ankle No gross deformity, swelling, ecchymoses FROM No TTP Negative ant drawer and talar tilt.   Negative syndesmotic compression. Thompsons test negative. NV intact distally.  We reviewed imaging that was taken last week at the emergency department that demonstrates no acute fracture or dislocation.  ASSESSMENT & PLAN:    1. Right ankle sprain  We will transition patient out of the boot, and fitted her for a lace up ankle brace today.  She can use the crutches as needed in the brace.  While she is at work, she can use the boot.  We provided a work note to let her use the boot and offer her sitting duty until her follow-up appointment.  She was also given a home exercise program for lateral ankle sprain.  Stressed the importance of motion at the ankle to avoid stiffness.  She will follow-up with in 2 weeks or sooner if symptoms worsen.  [place], DO, ATC Sports Medicine Fellow  Patient seen and evaluated with the sports medicine fellow.  I agree with the above plan of care.  I personally reviewed all x-rays done in the emergency room.  No obvious fracture is seen.  She does have soft tissue swelling at the lateral ankle consistent with a lateral ankle sprain.  She will transition out of her cam walker except while working.  She will start range of motion and strengthening exercises.  Wean from crutches as symptoms allow.  Follow-up in 2 weeks for reevaluation.  If symptoms persist at follow-up consider formal physical therapy.

## 2020-01-06 ENCOUNTER — Ambulatory Visit (INDEPENDENT_AMBULATORY_CARE_PROVIDER_SITE_OTHER): Payer: Medicaid Other | Admitting: Sports Medicine

## 2020-01-06 ENCOUNTER — Encounter: Payer: Self-pay | Admitting: Sports Medicine

## 2020-01-06 ENCOUNTER — Other Ambulatory Visit: Payer: Self-pay

## 2020-01-06 VITALS — BP 96/62 | Ht <= 58 in | Wt 133.0 lb

## 2020-01-06 DIAGNOSIS — S93401A Sprain of unspecified ligament of right ankle, initial encounter: Secondary | ICD-10-CM

## 2020-01-06 NOTE — Progress Notes (Addendum)
   [facility] [address] [place], [place] [address] Phone: [phone] Fax: [phone]   Patient Name: Catherine Nixon Date of Birth: [birth year] Medical Record Number: [id number] Gender: female Date of Encounter: 01/06/2020  SUBJECTIVE:      Chief Complaint:  Right ankle sprain   HPI:  Libertie is following up for right ankle sprain.  She has stopped using the boot and lace up brace because she says her symptoms have drastically improved.  She is wearing wedges today.  She states her job will not let her work in the boot and needed a full return to work clearance today.  She denies any pain, swelling, loss of function, or numbness.     ROS:     See HPI.   PERTINENT  PMH / PSH / FH / SH:  Past Medical, Surgical, Social, and Family History Reviewed & Updated in the EMR.    OBJECTIVE:  BP 96/62   Ht 4\' 7"  (1.397 m)   Wt 133 lb (60.3 kg)   BMI 30.91 kg/m  Physical Exam:  Vital signs are reviewed.   GEN: Alert and oriented, NAD Pulm: Breathing unlabored PSY: normal mood, congruent affect  MSK: Right ankle Trace swelling at lateral ankle FROM No TTP Negative ant drawer and talar tilt.   Negative syndesmotic compression. Thompsons test negative. NV intact distally.   ASSESSMENT & PLAN:   1. Right ankle sprain  Overall, patient seems to be doing significantly better.  I recommended she still wear the lace up ankle brace when exercising and at work.  Continue with the home rehab.  We have provided her a work note for full clearance back to work specifically wearing her sneakers.  She will follow-up with on an as-needed basis.   [place], DO, ATC Sports Medicine Fellow Addendum:  I was the preceptor for this visit and available for immediate consultation.  Judge Stall MD [facility] [place]

## 2020-01-12 ENCOUNTER — Ambulatory Visit: Payer: Medicaid Other | Admitting: Sports Medicine

## 2020-01-19 ENCOUNTER — Encounter (INDEPENDENT_AMBULATORY_CARE_PROVIDER_SITE_OTHER): Payer: Self-pay | Admitting: Primary Care

## 2020-01-19 ENCOUNTER — Other Ambulatory Visit: Payer: Self-pay

## 2020-01-19 ENCOUNTER — Telehealth (INDEPENDENT_AMBULATORY_CARE_PROVIDER_SITE_OTHER): Payer: Medicaid Other | Admitting: Primary Care

## 2020-01-19 DIAGNOSIS — K5909 Other constipation: Secondary | ICD-10-CM | POA: Diagnosis not present

## 2020-01-19 DIAGNOSIS — Z7689 Persons encountering health services in other specified circumstances: Secondary | ICD-10-CM | POA: Diagnosis not present

## 2020-01-19 MED ORDER — POLYETHYLENE GLYCOL 3350 17 G PO PACK: 17.0000 g | PACK | Freq: Every day | ORAL | 0 refills | Status: DC

## 2020-01-19 NOTE — Progress Notes (Signed)
Pain makes her feel dizzy She has to lay down to relieve the pain  Has to place pressure on right breast to relieve pain

## 2020-01-19 NOTE — Progress Notes (Signed)
Virtual Visit via Telephone Note  I connected with Catherine Nixon on 01/19/20 at  1:30 PM EDT by telephone and verified that I am speaking with the correct person using two identifiers.   I discussed the limitations, risks, security and privacy concerns of performing an evaluation and management service by telephone and the availability of in person appointments. I also discussed with the patient that there may be a patient responsible charge related to this service. The patient expressed understanding and agreed to proceed.  interpretor  History of Present Illness: Catherine Nixon, Catherine Nixon is a 19 year old Hispanic female is having a tele visit  To establish care and concern about pain in breast/stomach- redefined area upper gastric pain. Pain is there when stomach is empty or full . She has tried reducing spicy foods and pain does occurs with fried greasy foods. Some relief is felt when presses on right side under breast.   Past Medical History:  Diagnosis Date  . Medical history non-contributory    Current Outpatient Medications on File Prior to Visit  Medication Sig Dispense Refill  . omeprazole (PRILOSEC) 40 MG capsule Take 1 capsule (40 mg total) by mouth 2 (two) times daily before a meal. 60 capsule 0   No current facility-administered medications on file prior to visit.   Observations/Objective: Review of Systems  Gastrointestinal: Positive for abdominal pain and constipation.       Bowel movement 3 times a week  All other systems reviewed and are negative.  Assessment and Plan: Catherine Nixon was seen today for new patient (initial visit).  Diagnoses and all orders for this visit:  Encounter to establish care Catherine Passe, NP-C will be your  (PCP) she is mastered prepared . She is skilled to diagnosed and treat illness. Also able to answer health concern as well as continuing care of varied medical conditions, not limited by cause, organ system, or diagnosis.   Other  constipation MANAGEMENT OF CHRONIC CONSTIPATION   Drink fluids in the recommended amount everyday. Recommend amount is 8 cups of water daily. Do not replace water with Gatorade or Powerade as these should only be used when you are dehydrated.   Eat lots of high fiber foods-fruits, veggies, bran and whole grain instead of white bread  Be active everyday. Inactivity makes constipation worse.  Add psyllium daily (Metamucil) which comes in capsules now. Start very low dose and work up to recommended dose on bottle daily.  Stay away from Milk of Magnesia or any magnesium containing laxative, unless you need it to clear things out rarely. It is an addictive laxative and your gut will become dependent on it.   Other orders -     polyethylene glycol (MIRALAX / GLYCOLAX) 17 g packet; Take 17 g by mouth daily.     Follow Up Instructions:    I discussed the assessment and treatment plan with the patient. The patient was provided an opportunity to ask questions and all were answered. The patient agreed with the plan and demonstrated an understanding of the instructions.   The patient was advised to call back or seek an in-person evaluation if the symptoms worsen or if the condition fails to improve as anticipated.  I provided 20 minutes of non-face-to-face time during this encounter. Includes chart review, labs and imaging   [name], NP

## 2020-03-14 ENCOUNTER — Ambulatory Visit (INDEPENDENT_AMBULATORY_CARE_PROVIDER_SITE_OTHER): Payer: Medicaid Other | Admitting: Primary Care

## 2020-03-21 ENCOUNTER — Ambulatory Visit (INDEPENDENT_AMBULATORY_CARE_PROVIDER_SITE_OTHER): Payer: Medicaid Other | Admitting: Primary Care

## 2020-03-28 ENCOUNTER — Ambulatory Visit (INDEPENDENT_AMBULATORY_CARE_PROVIDER_SITE_OTHER): Payer: Medicaid Other | Admitting: Primary Care

## 2020-03-28 ENCOUNTER — Encounter (INDEPENDENT_AMBULATORY_CARE_PROVIDER_SITE_OTHER): Payer: Self-pay | Admitting: Primary Care

## 2020-03-28 ENCOUNTER — Other Ambulatory Visit (HOSPITAL_COMMUNITY)
Admission: RE | Admit: 2020-03-28 | Discharge: 2020-03-28 | Disposition: A | Discharge status: Home / Self Care | Payer: Medicaid Other | Source: Ambulatory Visit | Attending: Primary Care | Admitting: Primary Care

## 2020-03-28 ENCOUNTER — Other Ambulatory Visit: Payer: Self-pay

## 2020-03-28 VITALS — BP 101/67 | HR 63 | Temp 98.4°F | Ht <= 58 in | Wt 130.2 lb

## 2020-03-28 DIAGNOSIS — Z124 Encounter for screening for malignant neoplasm of cervix: Secondary | ICD-10-CM

## 2020-03-28 DIAGNOSIS — Z1159 Encounter for screening for other viral diseases: Secondary | ICD-10-CM | POA: Diagnosis not present

## 2020-03-28 DIAGNOSIS — Z01419 Encounter for gynecological examination (general) (routine) without abnormal findings: Secondary | ICD-10-CM | POA: Insufficient documentation

## 2020-03-28 DIAGNOSIS — Z23 Encounter for immunization: Secondary | ICD-10-CM | POA: Diagnosis not present

## 2020-03-28 NOTE — Patient Instructions (Signed)
Prueba de Papanicolaou Pap Test Por qu me debo realizar esta prueba? La prueba de Papanicolaou, tambin denominada citologa vaginal, es una prueba de cribado para detectar signos de:  Cncer de la vagina, del cuello uterino y del tero. El cuello uterino es la parte baja del tero que se abre hacia la vagina.  Infeccin.  Cambios que podran ser un signo de que se est desarrollando un cncer (cambios precancerosos). Las mujeres deben realizarse esta prueba con regularidad. En general, debe hacerse una prueba de Papanicolaou cada 3 aos hasta alcanzar la menopausia o hasta los 65 aos. Las mujeres entre 30 y 60 aos de edad pueden elegir realizarse la prueba de Papanicolaou al mismo tiempo que la prueba del VPH (virus del papiloma humano) cada 5 aos (en lugar de cada 3 aos). El mdico puede recomendarle que se realice pruebas de Papanicolaou con ms o menos frecuencia en funcin de sus afecciones mdicas y los resultados de la prueba de Papanicolaou anterior. Qu tipo de muestra se toma?  El mdico recolectar una muestra de clulas de la superficie del cuello uterino. Lo har utilizando un pequeo hisopo de algodn, una esptula de plstico o un cepillo. Esta muestra se recolecta durante un examen plvico, mientras usted est recostada boca arriba sobre la mesa de examen con los pies en los descansos para pies (estribos). En algunos casos, tambin pueden recolectarse fluidos (secreciones) del cuello uterino y la vagina. Cmo debo prepararme para esta prueba?  Tenga en cuenta en qu etapa del ciclo menstrual se encuentra. Es posible que se le pida que vuelva a programar la prueba si est menstruando el da en que debe realizrsela.  Si el da en que debe realizarse la prueba tiene una infeccin vaginal aparente, deber volver a programar la prueba.  Siga las indicaciones del mdico acerca de lo siguiente: ? Cambiar o suspender los medicamentos que toma habitualmente. Algunos  medicamentos pueden provocar resultados anormales de la prueba, como los digitlicos y la tetraciclina. ? Evite las duchas vaginales o los baos de inmersin el da de la prueba o el da anterior. Informe al mdico acerca de lo siguiente:  Cualquier alergia que tenga.  Todos los medicamentos que utiliza, incluidos vitaminas, hierbas, gotas oftlmicas, cremas y medicamentos de venta libre.  Cualquier enfermedad de la sangre que tenga.  Cirugas a las que se someti.  Cualquier afeccin mdica que tenga.  Si est embarazada o podra estarlo. Cmo se informan los resultados? Los resultados de la prueba se informarn como anormales o normales. Puede producirse un resultado positivo falso. Este tipo de resultado es incorrecto porque indica que una enfermedad est presente cuando en realidad no lo est. Puede producirse un resultado negativo falso. Este tipo de resultado es incorrecto porque indica que una enfermedad no est presente cuando en realidad lo est. Qu significan los resultados? Un resultado normal en la prueba significa que no tiene signos de cncer de la vagina, del cuello uterino o del tero. Un resultado anormal puede significar que tiene:  Cncer. Una prueba de Papanicolaou por s sola no es suficiente para diagnosticar cncer. En este caso, se le realizarn ms pruebas.  Cambios precancerosos en la vagina, cuello uterino o tero.  Inflamacin del cuello uterino.  Enfermedades de transmisin sexual (ETS).  Infecciones por hongos.  Infecciones por parsitos. Hable con su mdico sobre lo que significan sus resultados. Preguntas para hacerle al mdico Consulte a su mdico o pregunte en el departamento donde se realiza la prueba acerca de lo siguiente:    Cundo estarn disponibles mis resultados?  Cmo obtendr mis resultados?  Cules son mis opciones de tratamiento?  Qu otras pruebas necesito?  Cules son los prximos pasos que debo seguir? Resumen  En  general, las mujeres deben hacerse una prueba de Papanicolaou cada 3 aos hasta alcanzar la menopausia o hasta los 65 aos de edad.  El mdico recolectar una muestra de clulas de la superficie del cuello uterino. Lo har utilizando un pequeo hisopo de algodn, una esptula de plstico o un cepillo.  En algunos casos, tambin pueden recolectarse fluidos (secreciones) del cuello uterino y la vagina. Esta informacin no tiene como fin reemplazar el consejo del mdico. Asegrese de hacerle al mdico cualquier pregunta que tenga. Document Revised: 09/03/2017 Document Reviewed: 09/03/2017 Elsevier Patient Education  2020 Elsevier Inc.  

## 2020-03-28 NOTE — Progress Notes (Signed)
Subjective:     Catherine Nixon is a 19 y.o. female and is here for a comprehensive physical exam. The patient reports no problems.  Social History   Socioeconomic History  . Marital status: Single    Spouse name: Not on file  . Number of children: Not on file  . Years of education: Not on file  . Highest education level: Not on file  Occupational History  . Not on file  Tobacco Use  . Smoking status: Never Smoker  . Smokeless tobacco: Never Used  Substance and Sexual Activity  . Alcohol use: No  . Drug use: No  . Sexual activity: Never    Birth control/protection: Abstinence    Comment: FOB in [place]   Other Topics Concern  . Not on file  Social History Narrative  . Not on file   Social Determinants of Health   Financial Resource Strain:   . Difficulty of Paying Living Expenses:   Food Insecurity:   . Worried About Programme researcher, broadcasting/film/video in the Last Year:   . Barista in the Last Year:   Transportation Needs:   . Freight forwarder (Medical):   Marland Kitchen Lack of Transportation (Non-Medical):   Physical Activity:   . Days of Exercise per Week:   . Minutes of Exercise per Session:   Stress:   . Feeling of Stress :   Social Connections:   . Frequency of Communication with Friends and Family:   . Frequency of Social Gatherings with Friends and Family:   . Attends Religious Services:   . Active Member of Clubs or Organizations:   . Attends Banker Meetings:   Marland Kitchen Marital Status:   Intimate Partner Violence:   . Fear of Current or Ex-Partner:   . Emotionally Abused:   Marland Kitchen Physically Abused:   . Sexually Abused:    Health Maintenance  Topic Date Due  . Hepatitis C Screening  Never done  . COVID-19 Vaccine (1) Never done  . TETANUS/TDAP  Never done  . PAP-Cervical Cytology Screening  Never done  . PAP SMEAR-Modifier  Never done  . INFLUENZA VACCINE  05/08/2020  . HIV Screening  Completed   Review of Systems A comprehensive review of systems was  negative.   Objective:   .BP 101/67 (BP Location: Right Arm, Patient Position: Sitting, Cuff Size: Normal)   Pulse 63   Temp 98.4 F (36.9 C) (Oral)   Ht 4\' 7"  (1.397 m)   Wt 130 lb 3.2 oz (59.1 kg)   LMP 03/14/2020 (Approximate)   SpO2 96%   BMI 30.26 kg/m   Assessment:    Healthy female exam.  CONSTITUTIONAL: Well-developed, well-nourished obese female in no acute distress.  HENT:  Normocephalic, atraumatic, External right and left ear normal. Oropharynx is clear and moist EYES: Conjunctivae and EOM are normal. Pupils are equal, round, and reactive to light. No scleral icterus.  NECK: Normal range of motion, supple, no masses.  Normal thyroid.  SKIN: Skin is warm and dry. No rash noted. Not diaphoretic. No erythema. No pallor. NEUROLGIC: Alert and oriented to person, place, and time. Normal reflexes, muscle tone coordination. No cranial nerve deficit noted. PSYCHIATRIC: Normal mood and affect. Normal behavior. Normal judgment and thought content. CARDIOVASCULAR: Normal heart rate noted, regular rhythm RESPIRATORY: Clear to auscultation bilaterally. Effort and breath sounds normal, no problems with respiration noted. BREASTS:Taught SBE. ABDOMEN: Soft, normal bowel sounds, no distention noted.  No tenderness, rebound or guarding.  PELVIC:  Normal appearing external genitalia; normal appearing vaginal mucosa and cervix.  No abnormal discharge noted.  Pap smear obtained.  Normal uterine size, no other palpable masses, no uterine or adnexal tenderness. MUSCULOSKELETAL: Normal range of motion. No tenderness.  No cyanosis, clubbing, or edema.  2+ distal pulses.   Plan:  Valetta was seen today for gynecologic exam.  Diagnoses and all orders for this visit:  Need for Tdap vaccination -     Tdap vaccine greater than or equal to 7yo IM  Encounter for gynecological examination without abnormal finding -     Cervicovaginal ancillary only  Cervical cancer screening -     Cytology -  PAP([facility])  Encounter for hepatitis C screening test for low risk patient Health maintenance screening    See After Visit Summary for Counseling Recommendations

## 2020-03-29 LAB — CERVICOVAGINAL ANCILLARY ONLY
Bacterial Vaginitis (gardnerella): NEGATIVE
Candida Glabrata: NEGATIVE
Candida Vaginitis: NEGATIVE
Chlamydia: NEGATIVE
Comment: NEGATIVE
Comment: NEGATIVE
Comment: NEGATIVE
Comment: NEGATIVE
Comment: NEGATIVE
Comment: NORMAL
Neisseria Gonorrhea: NEGATIVE
Trichomonas: NEGATIVE

## 2020-03-29 LAB — HEPATITIS C ANTIBODY: Hep C Virus Ab: <0.1 s/co ratio (ref 0.0–0.9)

## 2020-03-29 LAB — CYTOLOGY - PAP: Diagnosis: NEGATIVE

## 2020-03-30 ENCOUNTER — Telehealth (INDEPENDENT_AMBULATORY_CARE_PROVIDER_SITE_OTHER): Payer: Self-pay

## 2020-03-30 NOTE — Telephone Encounter (Signed)
-----   Message from [name], NP sent at 03/29/2020  5:20 PM EDT ----- Hep C is negative and pap/std screening all normal . Next Pap 3 years

## 2020-03-30 NOTE — Telephone Encounter (Signed)
Patient verified date of birth. She is aware that blood work/pap/STD screening were all negative/normal. She verbalized understanding. Maryjean Morn, CMA

## 2020-04-04 ENCOUNTER — Ambulatory Visit (INDEPENDENT_AMBULATORY_CARE_PROVIDER_SITE_OTHER): Payer: Medicaid Other | Admitting: Primary Care

## 2020-05-19 ENCOUNTER — Ambulatory Visit (HOSPITAL_COMMUNITY)
Admission: EM | Admit: 2020-05-19 | Discharge: 2020-05-19 | Disposition: A | Discharge status: Home / Self Care | Payer: Medicaid Other | Attending: Internal Medicine | Admitting: Internal Medicine

## 2020-05-19 ENCOUNTER — Other Ambulatory Visit: Payer: Self-pay

## 2020-05-19 DIAGNOSIS — J029 Acute pharyngitis, unspecified: Secondary | ICD-10-CM | POA: Diagnosis not present

## 2020-05-19 DIAGNOSIS — Z20822 Contact with and (suspected) exposure to covid-19: Secondary | ICD-10-CM | POA: Diagnosis not present

## 2020-05-19 NOTE — Discharge Instructions (Addendum)
Please quarantine until COVID-19 test results available Increase oral fluids Tylenol/Motrin as needed for fever/body aches If COVID-19 test is positive you need to quarantine with your family for 10days. If any worsening symptoms you experience you need to return to urgent care to be reevaluated.

## 2020-05-20 LAB — SARS CORONAVIRUS 2 (TAT 6-24 HRS): SARS Coronavirus 2: NEGATIVE

## 2020-05-21 NOTE — ED Provider Notes (Signed)
[facility]    CSN: [id number] Arrival date & time: 05/19/20  1538      History   Chief Complaint No chief complaint on file.   HPI Catherine Nixon is a 19 y.o. female testing exposure to Covid 19+ individual.  Patient started having some sore throat yesterday and she came to the urgent care for COVID-19 testing.  Exposure to COVID-19 positive patient was several days ago.  No generalized body ache, no nausea vomiting, no loss of taste or smell.Marland Kitchen   HPI  Past Medical History:  Diagnosis Date  . Medical history non-contributory     Patient Active Problem List   Diagnosis Date Noted  . Vaginal delivery 10/13/2017  . Post term pregnancy at [redacted] weeks gestation 10/12/2017    Past Surgical History:  Procedure Laterality Date  . NO PAST SURGERIES      OB History    Gravida  1   Para  1   Term  1   Preterm      AB      Living  1     SAB      TAB      Ectopic      Multiple  0   Live Births  1            Home Medications    Prior to Admission medications   Medication Sig Start Date End Date Taking? Authorizing Provider  omeprazole (PRILOSEC) 40 MG capsule Take 1 capsule (40 mg total) by mouth 2 (two) times daily before a meal. 06/18/19 07/18/19  Lurline Idol, FNP    Family History Family History  Problem Relation Age of Onset  . Asthma Neg Hx   . Diabetes Neg Hx   . Heart disease Neg Hx   . Hypertension Neg Hx   . Stroke Neg Hx     Social History Social History   Tobacco Use  . Smoking status: Never Smoker  . Smokeless tobacco: Never Used  Substance Use Topics  . Alcohol use: No  . Drug use: No     Allergies   Patient has no known allergies.   Review of Systems Review of Systems  Respiratory: Negative.   Gastrointestinal: Negative.   Neurological: Negative.      Physical Exam Triage Vital Signs ED Triage Vitals  Enc Vitals Group     BP 05/19/20 1730 104/69     Pulse Rate 05/19/20 1730 67     Resp  05/19/20 1730 16     Temp 05/19/20 1730 99.2 F (37.3 C)     Temp Source 05/19/20 1730 Oral     SpO2 05/19/20 1730 98 %     Weight --      Height --      Head Circumference --      Peak Flow --      Pain Score 05/19/20 1806 0     Pain Loc --      Pain Edu? --      Excl. in GC? --    No data found.  Updated Vital Signs BP 104/69 (BP Location: Left Arm)   Pulse 67   Temp 99.2 F (37.3 C) (Oral)   Resp 16   SpO2 98%   Visual Acuity Right Eye Distance:   Left Eye Distance:   Bilateral Distance:    Right Eye Near:   Left Eye Near:    Bilateral Near:     Physical Exam Vitals and nursing note  reviewed.  Constitutional:      General: She is not in acute distress.    Appearance: She is not ill-appearing.  HENT:     Mouth/Throat:     Mouth: Mucous membranes are moist.     Pharynx: No posterior oropharyngeal erythema.  Cardiovascular:     Rate and Rhythm: Normal rate and regular rhythm.  Neurological:     Mental Status: She is alert.      UC Treatments / Results  Labs (all labs ordered are listed, but only abnormal results are displayed) Labs Reviewed  SARS CORONAVIRUS 2 (TAT 6-24 HRS)    EKG   Radiology No results found.  Procedures Procedures (including critical care time)  Medications Ordered in UC Medications - No data to display  Initial Impression / Assessment and Plan / UC Course  I have reviewed the triage vital signs and the nursing notes.  Pertinent labs & imaging results that were available during my care of the patient were reviewed by me and considered in my medical decision making (see chart for details).     1.  Viral pharyngitis in the setting of COVID-19 exposure: COVID-19 PCR test Warm salt water Please quarantine until COVID-19 test results are available Return precautions given. Final Clinical Impressions(s) / UC Diagnoses   Final diagnoses:  Exposure to COVID-19 virus  Viral pharyngitis     Discharge Instructions       Please quarantine until COVID-19 test results available Increase oral fluids Tylenol/Motrin as needed for fever/body aches If COVID-19 test is positive you need to quarantine with your family for 10days. If any worsening symptoms you experience you need to return to urgent care to be reevaluated.   ED Prescriptions    None     PDMP not reviewed this encounter.   Merrilee Jansky, MD 05/21/20 [phone]

## 2020-05-24 ENCOUNTER — Other Ambulatory Visit: Payer: Medicaid Other

## 2020-05-24 DIAGNOSIS — U071 COVID-19: Secondary | ICD-10-CM | POA: Diagnosis not present

## 2020-05-24 DIAGNOSIS — B342 Coronavirus infection, unspecified: Secondary | ICD-10-CM | POA: Diagnosis not present

## 2020-06-03 DIAGNOSIS — Z8616 Personal history of COVID-19: Secondary | ICD-10-CM | POA: Diagnosis not present

## 2020-08-26 ENCOUNTER — Encounter (HOSPITAL_BASED_OUTPATIENT_CLINIC_OR_DEPARTMENT_OTHER): Payer: Self-pay | Admitting: *Deleted

## 2020-08-26 ENCOUNTER — Other Ambulatory Visit: Payer: Self-pay

## 2020-08-26 ENCOUNTER — Emergency Department (HOSPITAL_BASED_OUTPATIENT_CLINIC_OR_DEPARTMENT_OTHER)
Admission: EM | Admit: 2020-08-26 | Discharge: 2020-08-26 | Disposition: A | Discharge status: Home / Self Care | Payer: Medicaid Other | Attending: Emergency Medicine | Admitting: Emergency Medicine

## 2020-08-26 ENCOUNTER — Encounter: Payer: Self-pay | Admitting: Nurse Practitioner

## 2020-08-26 ENCOUNTER — Emergency Department (HOSPITAL_BASED_OUTPATIENT_CLINIC_OR_DEPARTMENT_OTHER): Payer: Medicaid Other

## 2020-08-26 DIAGNOSIS — K8080 Other cholelithiasis without obstruction: Secondary | ICD-10-CM | POA: Insufficient documentation

## 2020-08-26 DIAGNOSIS — K8020 Calculus of gallbladder without cholecystitis without obstruction: Secondary | ICD-10-CM | POA: Diagnosis not present

## 2020-08-26 DIAGNOSIS — R109 Unspecified abdominal pain: Secondary | ICD-10-CM | POA: Diagnosis present

## 2020-08-26 DIAGNOSIS — R1011 Right upper quadrant pain: Secondary | ICD-10-CM

## 2020-08-26 DIAGNOSIS — R1013 Epigastric pain: Secondary | ICD-10-CM | POA: Diagnosis not present

## 2020-08-26 LAB — URINALYSIS, MICROSCOPIC (REFLEX)

## 2020-08-26 LAB — URINALYSIS, ROUTINE W REFLEX MICROSCOPIC
Bilirubin Urine: NEGATIVE
Glucose, UA: NEGATIVE mg/dL
Ketones, ur: NEGATIVE mg/dL
Nitrite: NEGATIVE
Protein, ur: NEGATIVE mg/dL
Specific Gravity, Urine: 1.025 (ref 1.005–1.030)
pH: 5.5 (ref 5.0–8.0)

## 2020-08-26 LAB — CBC WITH DIFFERENTIAL/PLATELET
Abs Immature Granulocytes: 0.02 10*3/uL (ref 0.00–0.07)
Basophils Absolute: 0 10*3/uL (ref 0.0–0.1)
Basophils Relative: 0 %
Eosinophils Absolute: 0.2 10*3/uL (ref 0.0–0.5)
Eosinophils Relative: 3 %
HCT: 41.3 % (ref 36.0–46.0)
Hemoglobin: 13.3 g/dL (ref 12.0–15.0)
Immature Granulocytes: 0 %
Lymphocytes Relative: 24 %
Lymphs Abs: 2.2 10*3/uL (ref 0.7–4.0)
MCH: 27.9 pg (ref 26.0–34.0)
MCHC: 32.2 g/dL (ref 30.0–36.0)
MCV: 86.6 fL (ref 80.0–100.0)
Monocytes Absolute: 0.4 10*3/uL (ref 0.1–1.0)
Monocytes Relative: 4 %
Neutro Abs: 6.1 10*3/uL (ref 1.7–7.7)
Neutrophils Relative %: 69 %
Platelets: 261 10*3/uL (ref 150–400)
RBC: 4.77 MIL/uL (ref 3.87–5.11)
RDW: 12.9 % (ref 11.5–15.5)
WBC: 8.8 10*3/uL (ref 4.0–10.5)
nRBC: 0 % (ref 0.0–0.2)

## 2020-08-26 LAB — COMPREHENSIVE METABOLIC PANEL
ALT: 15 U/L (ref 0–44)
AST: 17 U/L (ref 15–41)
Albumin: 4.2 g/dL (ref 3.5–5.0)
Alkaline Phosphatase: 73 U/L (ref 38–126)
Anion gap: 8 (ref 5–15)
BUN: 13 mg/dL (ref 6–20)
CO2: 23 mmol/L (ref 22–32)
Calcium: 9.1 mg/dL (ref 8.9–10.3)
Chloride: 105 mmol/L (ref 98–111)
Creatinine, Ser: 0.49 mg/dL (ref 0.44–1.00)
GFR, Estimated: >60 mL/min (ref >60)
Glucose, Bld: 93 mg/dL (ref 70–99)
Potassium: 4 mmol/L (ref 3.5–5.1)
Sodium: 136 mmol/L (ref 135–145)
Total Bilirubin: 0.3 mg/dL (ref 0.3–1.2)
Total Protein: 7.3 g/dL (ref 6.5–8.1)

## 2020-08-26 LAB — PREGNANCY, URINE: Preg Test, Ur: NEGATIVE

## 2020-08-26 LAB — LIPASE, BLOOD: Lipase: 25 U/L (ref 11–51)

## 2020-08-26 NOTE — Discharge Instructions (Addendum)
At this time there does not appear to be the presence of an emergent medical condition, however there is always the potential for conditions to change. Please read and follow the below instructions.  Please return to the Emergency Department immediately for any new or worsening symptoms. Please be sure to follow up with your Primary Care Provider within one week regarding your visit today; please call their office to schedule an appointment even if you are feeling better for a follow-up visit. Please call [facility] first thing Monday morning to schedule a follow-up appointment for further evaluation and treatment of your gallstones.  Go to the nearest Emergency Department immediately if: You have sudden pain in the upper right side of your belly, and it lasts for more than 2 hours. You have belly pain that lasts for more than 5 hours. You have a fever or chills. You keep feeling sick to your stomach or you keep throwing up. Your skin or the whites of your eyes turn yellow (jaundice). You have dark-colored pee (urine). You have light-colored poop (stool). You have any new/concerning or worsening of symptoms  Please read the additional information packets attached to your discharge summary.  Do not take your medicine if  develop an itchy rash, swelling in your mouth or lips, or difficulty breathing; call 911 and seek immediate emergency medical attention if this occurs.  You may review your lab tests and imaging results in their entirety on your MyChart account.  Please discuss all results of fully with your primary care provider and other specialist at your follow-up visit.  Note: Portions of this text may have been transcribed using voice recognition software. Every effort was made to ensure accuracy; however, inadvertent computerized transcription errors may still be present. ---------------- Below has been translated using Google translate.  Errors may be present.  Interpret  with caution.  A continuacin se ha traducido con Soil scientist. Puede haber errores. Interprete con precaucin. --------------- En este momento no parece haber la presencia de una afeccin mdica emergente, sin embargo, siempre existe la posibilidad de que las afecciones [place]. Lea y siga las instrucciones a continuacin.  1. Regrese al Departamento de Emergencias de inmediato si tiene sntomas nuevos o que [place]. 2. Asegrese de hacer un seguimiento con su Proveedor de atencin primaria dentro de una semana con respecto a su visita de hoy; llame a su oficina para programar una cita, incluso si se siente mejor para una visita de seguimiento. 3. Llame a [place] de [address] a primera hora el lunes por la maana para programar una cita de seguimiento para una evaluacin y tratamiento adicionales de sus clculos biliares.  Vaya al Departamento de Emergencias ms cercano de inmediato si: ? Tiene un dolor repentino en la parte superior derecha del abdomen y dura ms de 2 horas. ? Tiene dolor abdominal que dura ms de 5 horas. ? Tiene fiebre o escalofros. ? Sigue sintiendo Programme researcher, broadcasting/film/video o sigue vomitando. ? Su piel o el blanco de sus ojos se ponen amarillos (ictericia). ? Tiene pis (orina) de color oscuro. ? Tiene caca (heces) de color claro. ? Tiene sntomas nuevos, preocupantes o que empeoran  Lea los paquetes de informacin adicional adjuntos a su resumen de alta.  No tome su medicamento si desarrolla un sarpullido con picazn, hinchazn en su boca o labios, o dificultad para respirar; Llame al 911 y busque atencin mdica de emergencia inmediata si esto ocurre.  Puede revisar sus pruebas de laboratorio y [place]  resultados de las imgenes en su totalidad en su cuenta MyChart. Por favor, analice todos los resultados con su proveedor de atencin primaria y Dietitian en su visita de seguimiento.  Nota: Es posible que algunas partes de este texto se hayan  transcrito con un software de reconocimiento de voz. Se hizo todo lo posible para garantizar la precisin; sin embargo, an Charity fundraiser errores de transcripcin computarizados inadvertidos.

## 2020-08-26 NOTE — ED Provider Notes (Signed)
[facility] EMERGENCY DEPARTMENT Provider Note   CSN: [id number] Arrival date & time: 08/26/20  1438     History Chief Complaint  Patient presents with  . Abdominal Pain   Spanish video interpreter used throughout visit. Catherine Nixon is a 19 y.o. female otherwise healthy no daily medication use presents today for intermittent abdominal pain primarily in the epigastric and right upper quadrant sharp pain only after eating gradually resolves with time no clear alleviating factors, pain is moderate feels that it has worsened over the past few weeks.  She reports this morning after breakfast she had 1 episode of nonbloody/nonbilious emesis with her pain after eating.  Denies fever/chills, headache, chest pain/shortness of breath, diarrhea, dysuria/hematuria, vaginal bleeding/discharge or any additional concerns.  HPI     Past Medical History:  Diagnosis Date  . Medical history non-contributory     Patient Active Problem List   Diagnosis Date Noted  . Vaginal delivery 10/13/2017  . Post term pregnancy at [redacted] weeks gestation 10/12/2017    Past Surgical History:  Procedure Laterality Date  . NO PAST SURGERIES       OB History    Gravida  1   Para  1   Term  1   Preterm      AB      Living  1     SAB      TAB      Ectopic      Multiple  0   Live Births  1           Family History  Problem Relation Age of Onset  . Asthma Neg Hx   . Diabetes Neg Hx   . Heart disease Neg Hx   . Hypertension Neg Hx   . Stroke Neg Hx     Social History   Tobacco Use  . Smoking status: Never Smoker  . Smokeless tobacco: Never Used  Substance Use Topics  . Alcohol use: No  . Drug use: No    Home Medications Prior to Admission medications   Medication Sig Start Date End Date Taking? Authorizing Provider  omeprazole (PRILOSEC) 40 MG capsule Take 1 capsule (40 mg total) by mouth 2 (two) times daily before a meal. 06/18/19 07/18/19  Lurline Idol,  FNP    Allergies    Patient has no known allergies.  Review of Systems   Review of Systems Ten systems are reviewed and are negative for acute change except as noted in the HPI  Physical Exam Updated Vital Signs BP 103/64   Pulse (!) 59   Temp 98.5 F (36.9 C) (Oral)   Resp 16   Ht 5\' 2"  (1.575 m)   LMP 07/09/2020   SpO2 100%   BMI 23.81 kg/m   Physical Exam Constitutional:      General: She is not in acute distress.    Appearance: Normal appearance. She is well-developed. She is not ill-appearing or diaphoretic.  HENT:     Head: Normocephalic and atraumatic.  Eyes:     General: Vision grossly intact. Gaze aligned appropriately.     Pupils: Pupils are equal, round, and reactive to light.  Neck:     Trachea: Trachea and phonation normal.  Pulmonary:     Effort: Pulmonary effort is normal. No respiratory distress.  Abdominal:     General: There is no distension.     Palpations: Abdomen is soft.     Tenderness: There is no abdominal tenderness (No active pain).  There is no guarding or rebound.  Genitourinary:    Comments: Deferred by patient Musculoskeletal:        General: Normal range of motion.     Cervical back: Normal range of motion.  Skin:    General: Skin is warm and dry.  Neurological:     Mental Status: She is alert.     GCS: GCS eye subscore is 4. GCS verbal subscore is 5. GCS motor subscore is 6.     Comments: Speech is clear and goal oriented, follows commands Major Cranial nerves without deficit, no facial droop Moves extremities without ataxia, coordination intact  Psychiatric:        Behavior: Behavior normal.     ED Results / Procedures / Treatments   Labs (all labs ordered are listed, but only abnormal results are displayed) Labs Reviewed  URINALYSIS, ROUTINE W REFLEX MICROSCOPIC - Abnormal; Notable for the following components:      Result Value   APPearance HAZY (*)    Hgb urine dipstick TRACE (*)    Leukocytes,Ua TRACE (*)    All  other components within normal limits  URINALYSIS, MICROSCOPIC (REFLEX) - Abnormal; Notable for the following components:   Bacteria, UA MANY (*)    All other components within normal limits  CBC WITH DIFFERENTIAL/PLATELET  COMPREHENSIVE METABOLIC PANEL  LIPASE, BLOOD  PREGNANCY, URINE    EKG None  Radiology US Abdomen Limited RUQ (LIVER/GB)  Result Date: 08/26/2020 CLINICAL DATA:  Epigastric pain and mid chest pain 6 months. EXAM: ULTRASOUND ABDOMEN LIMITED RIGHT UPPER QUADRANT COMPARISON:  None. FINDINGS: Gallbladder: Two moderate size mobile gallstones are present. Larger measures 1.5 cm. No gallbladder wall thickening or pericholecystic fluid. Patient is tender over the gallbladder on exam. Common bile duct: Diameter: 2 mm. Liver: No focal lesion identified. Within normal limits in parenchymal echogenicity. Portal vein is patent on color Doppler imaging with normal direction of blood flow towards the liver. Other: None. IMPRESSION: Cholelithiasis without additional sonographic evidence of acute cholecystitis. Electronically Signed   By: Elberta Fortis M.D.   On: 08/26/2020 18:32    Procedures Procedures (including critical care time)  Medications Ordered in ED Medications - No data to display  ED Course  I have reviewed the triage vital signs and the nursing notes.  Pertinent labs & imaging results that were available during my care of the patient were reviewed by me and considered in my medical decision making (see chart for details).    MDM Rules/Calculators/A&P                         Additional history obtained from: 1. Nursing notes from this visit. ---------------------- I reviewed and interpreted labs which include: CBC within normal limits, no leukocytosis to suggest bacterial infection, no anemia. CMP without emergent electrolyte derangement, AKI, LFT elevations or gap. Lipase within normal limits, no evidence of pancreatitis. Urine pregnancy test negative, no  evidence to suggest ectopic. Urinalysis appears contaminated, patient without urinary symptoms doubt UTI or pyelo.  RUQ [place]:  IMPRESSION:  Cholelithiasis without additional sonographic evidence of acute  cholecystitis.  -------------- History and physical examination as well as work-up above is consistent with symptomatic cholelithiasis.  Patient without active pain, doubt cholecystitis, perforation, appendicitis, torsion or other emergent pathologies at this time.  Patient tolerating p.o., no indication for admission, further imaging, antibiotics or urgent surgical interventions.  No indication for antibiotics.  I consulted general surgeon Dr. Dwain Sarna at 7:17 PM given  the duration of patient's symptoms and language barriers, he advised the patient call their office on Monday to schedule follow-up appointment for future cholecystectomy.  At this time there does not appear to be any evidence of an acute emergency medical condition and the patient appears stable for discharge with appropriate outpatient follow up. Diagnosis was discussed with patient who verbalizes understanding of care plan and is agreeable to discharge. I have discussed return precautions with patient who verbalizes understanding. Patient encouraged to follow-up with their PCP and General Surgery. All questions answered.  Note: Portions of this report may have been transcribed using voice recognition software. Every effort was made to ensure accuracy; however, inadvertent computerized transcription errors may still be present. Final Clinical Impression(s) / ED Diagnoses Final diagnoses:  RUQ abdominal pain  Symptomatic cholelithiasis    Rx / DC Orders ED Discharge Orders    None       Elizabeth Palau 08/26/20 1934    Linwood Dibbles, MD 08/27/20 [id number]

## 2020-08-26 NOTE — ED Notes (Signed)
Discharge instructions discussed with patient. Verbalized understanding and importance of pain management. Departs ED at this time in stable condition.

## 2020-08-26 NOTE — ED Triage Notes (Addendum)
c/o right upper abd pain which radiates to back  x 6 months, increased pain after eating

## 2020-09-07 ENCOUNTER — Ambulatory Visit (INDEPENDENT_AMBULATORY_CARE_PROVIDER_SITE_OTHER): Payer: Self-pay | Admitting: *Deleted

## 2020-09-07 NOTE — Telephone Encounter (Signed)
Patient is calling trying to cancel appointment for tomorrow- patient triaged for symptoms: diagnosed with Symptomatic cholelithiasis - patient has hospital follow up appointment tomorrow. Advised keep appointment so PCP can establish treatment plan and get referrals in order. She will keep appointment  Reason for Disposition . [1] Chest pain lasts > 5 minutes AND [2] occurred in past 3 days (72 hours) (Exception: feels exactly the same as previously diagnosed heartburn and has accompanying sour taste in mouth)  Answer Assessment - Initial Assessment Questions 1. LOCATION: "Where does it hurt?"       Between stomach and chest- bottom of ribs- middle- recently seen at ED: diagnosed with Symptomatic cholelithiasis  2. RADIATION: "Does the pain go anywhere else?" (e.g., into neck, jaw, arms, back)     Pain starts in the middle and goes to the R 3. ONSET: "When did the chest pain begin?" (Minutes, hours or days)       3 years ago- worse 2 months ago 4. PATTERN "Does the pain come and go, or has it been constant since it started?"  "Does it get worse with exertion?"      Comes and goes 5. DURATION: "How long does it last" (e.g., seconds, minutes, hours)     2 hours 6. SEVERITY: "How bad is the pain?"  (e.g., Scale 1-10; mild, moderate, or severe)    - MILD (1-3): doesn't interfere with normal activities     - MODERATE (4-7): interferes with normal activities or awakens from sleep    - SEVERE (8-10): excruciating pain, unable to do any normal activities       8-9 7. CARDIAC RISK FACTORS: "Do you have any history of heart problems or risk factors for heart disease?" (e.g., angina, prior heart attack; diabetes, high blood pressure, high cholesterol, smoker, or strong family history of heart disease)     no 8. PULMONARY RISK FACTORS: "Do you have any history of lung disease?"  (e.g., blood clots in lung, asthma, emphysema, birth control pills)     no 9. CAUSE: "What do you think is causing the chest  pain?"     Patient was seen at ED- patient state she was not given anything to help the pain 10. OTHER SYMPTOMS: "Do you have any other symptoms?" (e.g., dizziness, nausea, vomiting, sweating, fever, difficulty breathing, cough)       Nausea and vomiting- vomited blood 1 week  , diarrhea 11. PREGNANCY: "Is there any chance you are pregnant?" "When was your last menstrual period?"       No- LMP- just ended  Protocols used: CHEST PAIN-A-AH

## 2020-09-08 ENCOUNTER — Other Ambulatory Visit: Payer: Self-pay

## 2020-09-08 ENCOUNTER — Encounter (INDEPENDENT_AMBULATORY_CARE_PROVIDER_SITE_OTHER): Payer: Self-pay | Admitting: Primary Care

## 2020-09-08 ENCOUNTER — Ambulatory Visit (INDEPENDENT_AMBULATORY_CARE_PROVIDER_SITE_OTHER): Payer: Medicaid Other | Admitting: Primary Care

## 2020-09-08 VITALS — BP 109/68 | HR 60 | Temp 97.3°F | Ht 62.0 in | Wt 127.4 lb

## 2020-09-08 DIAGNOSIS — K8000 Calculus of gallbladder with acute cholecystitis without obstruction: Secondary | ICD-10-CM | POA: Diagnosis not present

## 2020-09-08 DIAGNOSIS — Z23 Encounter for immunization: Secondary | ICD-10-CM

## 2020-09-08 DIAGNOSIS — Z09 Encounter for follow-up examination after completed treatment for conditions other than malignant neoplasm: Secondary | ICD-10-CM | POA: Diagnosis not present

## 2020-09-08 DIAGNOSIS — R110 Nausea: Secondary | ICD-10-CM | POA: Diagnosis not present

## 2020-09-08 MED ORDER — ONDANSETRON 4 MG PO TBDP: 4.0000 mg | ORAL_TABLET | Freq: Three times a day (TID) | ORAL | 1 refills | Status: DC | PRN

## 2020-09-08 NOTE — Patient Instructions (Addendum)
Gripe en los adultos Influenza, Adult A la gripe tambin se la conoce como "influenza". Es una infeccin en los pulmones, la nariz y la garganta (vas respiratorias). La causa un virus. La gripe provoca sntomas que son similares a los de un resfro. Tambin causa fiebre alta y dolores corporales. Se transmite fcilmente de persona a persona (es contagiosa). La mejor manera de prevenir la gripe es aplicndose la vacuna contra la gripe todos los aos. Cules son las causas? La causa de esta afeccin es el virus de la influenza. Puede contraer el virus de las siguientes maneras:  Respirar las gotitas que estn en el aire y que provienen de la tos o el estornudo de una persona que tiene el virus.  Tocar algo que tiene el virus (est contaminado) y luego tocarse la boca, la nariz o los ojos. Qu incrementa el riesgo? Hay ciertas cosas que lo pueden hacer ms propenso a tener gripe. Estas incluyen lo siguiente:  No lavarse las manos con frecuencia.  Tener contacto cercano con muchas personas durante la temporada de resfro y gripe.  Tocarse la boca, los ojos o la nariz sin antes lavarse las manos.  No recibir la vacuna antigripal todos los aos. Puede correr un mayor riesgo de tener gripe, junto con problemas graves como una infeccin pulmonar (neumona), si:  Es mayor de 65 aos de edad.  Est embarazada.  Tiene debilitado el sistema que combate las defensas (sistema inmunitario) debido a una enfermedad o porque toma determinados medicamentos.  Tiene una enfermedad prolongada (crnica), por ejemplo: ? Enfermedad cardaca, renal o pulmonar. ? Diabetes. ? Asma.  Tiene un trastorno heptico.  Tiene mucho sobrepeso (obesidad mrbida).  Tiene anemia. Esta es una afeccin que afecta a los glbulos rojos. Cules son los signos o los sntomas? Los sntomas normalmente comienzan de repente y duran entre 4 y 14 das. Pueden incluir los siguientes:  Fiebre y escalofros.  Dolores de  cabeza, dolores en el cuerpo o dolores musculares.  Dolor de garganta.  Tos.  Secrecin o congestin nasal.  Malestar en el pecho.  No desear comer en las cantidades normales (prdida del apetito).  Debilidad o cansancio (fatiga).  Mareos.  Malestar estomacal (nuseas) o ganas de devolver (vmitos). Cmo se trata? Si la gripe se encuentra de forma temprana, se la puede tratar con medicamentos que pueden ayudar a reducir la gravedad de la enfermedad y reducir su duracin (medicamentos antivirales). Estos pueden administrarse por boca (va oral) o por va (catter) intravenosa. Cuidarse en su hogar puede ayudar a que mejoren los sntomas. El mdico puede sugerirle lo siguiente:  Tomar medicamentos de venta libre.  Beber mucho lquido. La gripe suele desaparecer sola. Si tiene sntomas muy graves u otros problemas, puede recibir tratamiento en un hospital. Siga estas indicaciones en su casa:     Actividad  Descanse todo lo que sea necesario. Duerma lo suficiente.  Qudese en su casa y no concurra al trabajo o a la escuela, como se lo haya indicado el mdico. ? No salga de su casa hasta que no haya tenido fiebre por 24horas sin tomar medicamentos. ? Salga de su casa solo para ir al mdico. Comida y bebida  Tome una SRO (solucin de rehidratacin oral). Es una bebida que se vende en farmacias y tiendas.  Beba suficiente lquido para mantener el pis (la orina) de color amarillo plido.  En la medida en que pueda, beba lquidos claros en pequeas cantidades. Los lquidos transparentes son, por ejemplo: ? Agua. ? Trocitos   de hielo. ? Jugo de frutas con agua agregada (jugo de frutas diluido). ? Bebidas deportivas de bajas caloras.  En la medida en que pueda, consuma alimentos blandos y fciles de digerir en pequeas cantidades. Estos alimentos incluyen: ? Bananas. ? Pur de [employer]. ? Arroz. ? [employer]. ? Tostadas. ? Galletas.  No coma ni beba lo  siguiente: ? Lquidos con alto contenido de azcar o cafena. ? Alcohol. ? Alimentos condimentados o con alto contenido de [place] Indicaciones generales  [employer] de venta libre y los recetados solamente como se lo haya indicado el mdico.  Use un humidificador de aire fro para que el aire de su casa est ms hmedo. Esto puede facilitar la respiracin.  Al toser o estornudar, cbrase la boca y la [place].  Lvese las manos con agua y jabn frecuentemente, en especial despus de toser o Engineering geologist. Use desinfectante para manos con alcohol si no dispone de [place] y [place].  Concurra a todas las visitas de control como se lo haya indicado el mdico. Esto es importante. Cmo se evita?   Colquese la vacuna antigripal todos los [place]. Puede colocarse la vacuna contra la gripe a fines de verano, en otoo o en invierno. Pregntele al mdico cundo debe aplicarse la vacuna contra la gripe.  Evite el contacto con personas que estn enfermas durante el otoo y el invierno (la temporada de resfro y gripe). Comunquese con un mdico si:  Tiene sntomas nuevos.  Tiene los siguientes sntomas: ? Journalist, newspaper. ? Materia fecal lquida (diarrea). ? Fiebre.  La tos empeora.  Empieza a tener ms mucosidad.  Tiene Programme researcher, broadcasting/film/video.  Vomita. Solicite ayuda inmediatamente si:  Le falta el aire.  Tiene dificultad para respirar.  La piel o las uas se ponen de un color azulado.  Presenta dolor muy intenso o rigidez en el cuello.  Tiene dolor de cabeza repentino.  Le duele la cara o el odo de forma repentina.  No puede comer ni beber sin vomitar. Resumen  La gripe es una infeccin en los pulmones, la nariz y Administrator. La causa un virus.  Tome los medicamentos de venta libre y los recetados solamente como se lo haya indicado el mdico.  Aplicarse la vacuna contra la gripe todos los aos es la mejor manera de evitar contagiarse la gripe. Esta informacin no tiene  Theme park manager el consejo del mdico. Asegrese de hacerle al mdico cualquier pregunta que tenga. Document Revised: 05/07/2018 Document Reviewed: 05/07/2018 Elsevier Patient Education  2020 Elsevier Inc.  Colelitiasis Cholelithiasis  La colelitiasis tambin recibe el nombre de "clculos biliares". Es un tipo de enfermedad de la vescula biliar. La vescula biliar es un rgano que almacena un lquido (bilis) que ayuda a Location manager las [place]. Los clculos biliares podran no causar sntomas (clculos biliares silenciosos) hasta provocar una obstruccin; luego, pueden causar dolor (ataque de la vescula biliar). Siga estas indicaciones en su casa:  Tome los medicamentos de venta libre y los recetados solamente como se lo haya indicado el mdico.  Mantenga un peso saludable.  Consuma alimentos saludables. Esto incluye lo siguiente: ? Comer una menor cantidad de alimentos grasos, como los alimentos fritos. ? Comer una menor cantidad de carbohidratos refinados. Los carbohidratos refinados son los panes y los cereales muy procesados, como el pan blanco y el arroz blanco. En cambio, elegir cereales integrales, como el pan integral o el arroz integral. ? Consumir ms fibra. [place], las frutas frescas y los frijoles son fuentes  saludables de fibra.  Concurra a todas las visitas de [address] se lo haya indicado el mdico. Esto es importante. Comunquese con un mdico si:  De repente, siente dolor en el costado superior derecho del vientre (abdomen). El dolor podra extenderse hasta el hombro derecho o el pecho. Estos pueden ser sntomas de un ataque de la vescula biliar.  Siente Programme researcher, broadcasting/film/video (tiene nuseas).  Devuelve (vomita).  Le han diagnosticado clculos biliares que no presentan sntomas y tiene lo siguiente: ? Dolor abdominal. ? Molestias, ardor o sensacin de plenitud en la parte superior del vientre (empacho). Solicite ayuda de inmediato si:  De repente, siente  dolor en el costado superior derecho del vientre que dura ms de 2horas.  Tiene dolor abdominal que dura ms de 5horas.  Tiene fiebre o siente escalofros.  Sigue sintiendo nuseas o vomitando.  Nota que la piel o la parte blanca del ojo estn amarillas (ictericia).  Hace pis (orina) de color oscuro.  Su materia fecal (heces) es de tono muy claro. Resumen  La colelitiasis tambin recibe el nombre de "clculos biliares".  La vescula biliar es un rgano que almacena un lquido (bilis) que ayuda a Location manager las [place].  Los clculos biliares silenciosos son clculos biliares que no causan sntomas.  Un ataque de la vescula biliar podra causar un dolor repentino en el costado superior derecho del vientre. El dolor podra extenderse hasta el hombro derecho o el pecho. Si esto ocurre, comunquese con el mdico.  Si le aparece un dolor repentino en el costado superior derecho del vientre que dura ms de 2horas, busque ayuda de inmediato. Esta informacin no tiene Theme park manager el consejo del mdico. Asegrese de hacerle al mdico cualquier pregunta que tenga. Document Revised: 03/25/2017 Document Reviewed: 03/18/2013 Elsevier Patient Education  2020 [employer].

## 2020-09-08 NOTE — Progress Notes (Signed)
      HPI Ms .Aryiah Donu-Perez 19 y.o.female presents for follow up from the Emergency room on  08/26/20 for Abdominal PainSymptomatic cholelithiasis . She admits nausea without emesis, abdominal pain pain score 4 initially rises to a 9 which causes her to have to lay down and balls ups. Past Medical History:  Diagnosis Date  . Medical history non-contributory      No Known Allergies    Current Outpatient Medications on File Prior to Visit  Medication Sig Dispense Refill  . omeprazole (PRILOSEC) 40 MG capsule Take 1 capsule (40 mg total) by mouth 2 (two) times daily before a meal. 60 capsule 0   No current facility-administered medications on file prior to visit.    ROS: all negative except above.   Physical Exam: Filed Weights   09/08/20 1550  Weight: 127 lb 6.4 oz (57.8 kg)   Wt 127 lb 6.4 oz (57.8 kg)   LMP 09/05/2020 (Approximate)   BMI 23.30 kg/m  General Appearance: Well nourished, in acute distress. Eyes: PERRLA, EOMs, conjunctiva no swelling or erythema Sinuses: No Frontal/maxillary tenderness ENT/Mouth: Ext aud canals clear, TMs without erythema, bulging.Marland Kitchen Hearing normal.  Neck: Supple, thyroid normal.  Respiratory: Respiratory effort normal, BS equal bilaterally without rales, rhonchi, wheezing or stridor.  Cardio: RRR with no MRGs. Brisk peripheral pulses without edema.  Abdomen: Soft, + BS.   tender, guarding, rebound, no hernias, masses. Lymphatics: Non tender without lymphadenopathy.  Musculoskeletal: Full ROM, 5/5 strength, normal gait.  Skin: Warm, dry without rashes, lesions, ecchymosis.  Neuro: Cranial nerves intact. Normal muscle tone, no cerebellar symptoms. Sensation intact.  Psych: Awake and oriented X 3, normal affect, Insight and Judgment appropriate.    Zyon was seen today for hospitalization follow-up.  Diagnoses and all orders for this visit:  Calculus of gallbladder with acute cholecystitis without obstruction Refer to General Surgery GI  tx for s/s of nausea  Call Surgery, [facility] discharge follow-up Recommended from ED to f/u with general surgery order placed   Nausea without vomiting -     ondansetron (ZOFRAN-ODT) 4 MG disintegrating tablet; Take 1 tablet (4 mg total) by mouth every 8 (eight) hours as needed for nausea or vomiting.  Need for immunization against influenza -     Flu Vaccine QUAD 36+ mos IM   [name], NP 3:52 PM

## 2020-09-15 ENCOUNTER — Encounter: Payer: Self-pay | Admitting: Nurse Practitioner

## 2020-09-15 ENCOUNTER — Ambulatory Visit (INDEPENDENT_AMBULATORY_CARE_PROVIDER_SITE_OTHER): Payer: Medicaid Other | Admitting: Nurse Practitioner

## 2020-09-15 VITALS — BP 112/60 | HR 70 | Ht 62.0 in | Wt 122.6 lb

## 2020-09-15 DIAGNOSIS — R1013 Epigastric pain: Secondary | ICD-10-CM

## 2020-09-15 DIAGNOSIS — R112 Nausea with vomiting, unspecified: Secondary | ICD-10-CM | POA: Diagnosis not present

## 2020-09-15 DIAGNOSIS — R1011 Right upper quadrant pain: Secondary | ICD-10-CM

## 2020-09-15 HISTORY — DX: Epigastric pain: R10.13

## 2020-09-15 NOTE — Progress Notes (Signed)
Agree with assessment and plan as outlined.  Symptoms could very well be due to cholelithiasis/biliary colic, agree with following up with general surgery to discuss possible cholecystectomy.  We can clear her upper tract with EGD if that is needed to exclude other upper tract etiologies. Will see if trial of PPI helps as well.

## 2020-09-15 NOTE — Patient Instructions (Addendum)
If you are age 21 or older, your body mass index should be between 23-30. Your Body mass index is 22.42 kg/m. If this is out of the aforementioned range listed, please consider follow up with your Primary Care Provider.  If you are age 83 or younger, your body mass index should be between 19-25. Your Body mass index is 22.42 kg/m. If this is out of the aformentioned range listed, please consider follow up with your Primary Care Provider.   We have sent the following medications to your pharmacy for you to pick up at your convenience: Omeprazole 20 mg daily.  Call office is symptoms get worse.  You have been scheduled for an endoscopy. Please follow written instructions given to you at your visit today. If you use inhalers (even only as needed), please bring them with you on the day of your procedure.  Due to recent changes in healthcare laws, you may see the results of your imaging and laboratory studies on MyChart before your provider has had a chance to review them.  We understand that in some cases there may be results that are confusing or concerning to you. Not all laboratory results come back in the same time frame and the provider may be waiting for multiple results in order to interpret others.  Please give [place] 48 hours in order for your provider to thoroughly review all the results before contacting the office for clarification of your results.    Hemos enviado los siguientes medicamentos a su farmacia para que los recoja a su conveniencia: Omeprazol 20 mg al [employer].  Llame al consultorio si los sntomas empeoran.  Se le ha programado una endoscopia. Siga las instrucciones escritas que se le dieron en su visita de hoy. Si [place] inhaladores (aunque solo sea necesario), Armed forces operational officer del procedimiento.  Debido a cambios recientes en las leyes de atencin mdica, es posible que vea los resultados de sus estudios de diagnstico por imgenes y de laboratorio en MyChart antes de que su  proveedor haya tenido la oportunidad de revisarlos. Entendemos que en algunos casos puede haber resultados confusos o preocupantes para usted. No todos los resultados de laboratorio se obtienen en el mismo perodo de tiempo y el proveedor puede estar esperando varios resultados para Administrator, Civil Service. Por favor, dnos 48 horas para que su proveedor revise minuciosamente todos los resultados antes de comunicarse con la oficina para Engineer, structural.

## 2020-09-15 NOTE — Progress Notes (Signed)
09/15/2020 Catherine Nixon [id number] 2001/02/10   CHIEF COMPLAINT:  Epigastric pain, nausea and vomiting   HISTORY OF PRESENT ILLNESS:  Catherine Nixon is a 19 year old female with no significant past medical history.  She speaks [place].  She is accompanied by a [place] interpreter. She presents to our office for further evaluation regarding epigastric pain which radiates to her back x 3 weeks. She reported having epigastric, right upper quadrant abdominal pain and vomited on 08/26/2020.  She described seeing spots of brownish-red blood in her emesis.  She presented to [facility] ED on 08/26/2020 for further evaluation.  Labs in the ED showed a normal CBC with a WBC 8.8.  Hemoglobin 13.3.  Total bili 0.3.  Alk phos 73.  AST 17.  ALT 15.  Lipase was normal. A right upper quadrant abdominal ultrasound showed cholelithiasis without sonographic evidence of acute cholecystitis.  She was evaluated by general surgeon Dr. Donne Hazel and he recommended outpatient follow-up for a possible cholecystectomy.  She was referred to our office by her PCP Juluis Mire for further GI evaluation prior to pursuing a cholecystectomy.  Currently, she continues to have epigastric pain which radiates to the right back.  Eating most foods worsens her pain. She is avoiding fatty foods. She has intermittent nausea which is somewhat controlled with taking Zofran.  No further episodes of vomiting.  No heartburn or dysphagia.  She is passing normal formed brown bowel movement daily.  No rectal bleeding or melena.  She took Ibuprofen 400 mg 4-5 times daily for 1 week following her 11/19 ER evaluation due to her persistetn upper abdominal pain. She was prescribed Omeprazole but she did not take this medication.  She denied taking any NSAIDs on a regular basis prior to her ER evaluation.  She reports losing 12 pounds in the past month.  No fever, sweats or chills.  No family history of esophageal, gastric  or colon cancer.  She reports her last menstrual cycle was 09/08/2020.  She denies chance of pregnancy.   CBC Latest Ref Rng & Units 08/26/2020 12/24/2019 10/12/2017  WBC 4.0 - 10.5 K/uL 8.8 7.7 7.6  Hemoglobin 12.0 - 15.0 g/dL 13.3 13.1 11.9(L)  Hematocrit 36.0 - 46.0 % 41.3 40.8 36.2  Platelets 150 - 400 K/uL 261 284 261   CMP Latest Ref Rng & Units 08/26/2020 12/24/2019 06/18/2019  Glucose 70 - 99 mg/dL 93 99 96  BUN 6 - 20 mg/dL $Remove'13 12 12  'SQgIDCv$ Creatinine 0.44 - 1.00 mg/dL 0.49 0.52 0.44  Sodium 135 - 145 mmol/L 136 139 140  Potassium 3.5 - 5.1 mmol/L 4.0 3.4(L) 4.0  Chloride 98 - 111 mmol/L 105 105 108  CO2 22 - 32 mmol/L $RemoveB'23 24 26  'xdVDywyf$ Calcium 8.9 - 10.3 mg/dL 9.1 9.1 8.7(L)  Total Protein 6.5 - 8.1 g/dL 7.3 - 6.5  Total Bilirubin 0.3 - 1.2 mg/dL 0.3 - 0.4  Alkaline Phos 38 - 126 U/L 73 - 93  AST 15 - 41 U/L 17 - 19  ALT 0 - 44 U/L 15 - 15   Right upper quadrant abdominal ultrasound 08/26/2020: Cholelithiasis without additional sonographic evidence of acute cholecystitis  Past Medical History:  Diagnosis Date  . Medical history non-contributory    Past Surgical History:  Procedure Laterality Date  . NO PAST SURGERIES     Social History: She is single. Nonsmoker. No alcohol use. No drug use. She has a daughter age 54 yrs with  bladder  issues.  Family History:  Mother age 79 history of a benign intestinal mass and a hernia. Father age 51 with diabetes. Three sisters, oldest sister with intestinal problem. Brother is healthy. No family history of esophageal, gastric or colon cancer.   No Known Allergies    Outpatient Encounter Medications as of 09/15/2020  Medication Sig  . omeprazole (PRILOSEC) 40 MG capsule Take 1 capsule (40 mg total) by mouth 2 (two) times daily before a meal.  . ondansetron (ZOFRAN-ODT) 4 MG disintegrating tablet Take 1 tablet (4 mg total) by mouth every 8 (eight) hours as needed for nausea or vomiting.   No facility-administered encounter medications on file as  of 09/15/2020.     REVIEW OF SYSTEMS:  Gen: Denies fever, sweats or chills. No weight loss.  CV: Denies chest pain, palpitations or edema. Resp: Denies cough, shortness of breath of hemoptysis.  GI: See HPI. GU : Denies urinary burning, blood in urine, increased urinary frequency or incontinence. MS: Denies joint pain, muscles aches or weakness. Derm: Denies rash, itchiness, skin lesions or unhealing ulcers. Psych: Denies depression, anxiety or memory loss. Heme: Denies bruising, bleeding. Neuro:  Denies headaches, dizziness or paresthesias. Endo:  Denies any problems with DM, thyroid or adrenal function.  PHYSICAL EXAM: LMP 09/05/2020 (Approximate)  General: Well developed 19 year old female in no acute distress. Head: Normocephalic and atraumatic. Eyes:  Sclerae non-icteric, conjunctive pink. Ears: Normal auditory acuity. Mouth: Dentition intact. No ulcers or lesions.  Neck: Supple, no lymphadenopathy or thyromegaly.  Lungs: Clear bilaterally to auscultation without wheezes, crackles or rhonchi. Heart: Regular rate and rhythm. No murmur, rub or gallop appreciated.  Abdomen: Soft, non distended. Mild epigastric and RUQ tenderness. No masses. No hepatosplenomegaly. Normoactive bowel sounds x 4 quadrants.  Rectal: Deferred.  Musculoskeletal: Symmetrical with no gross deformities. Skin: Warm and dry. No rash or lesions on visible extremities. Extremities: No edema. Neurological: Alert oriented x 4, no focal deficits.  Psychological:  Alert and cooperative. Normal mood and affect.  ASSESSMENT AND PLAN:  23.  19 year old female with nausea/vomiting, epigastric pain which radiates to the right back. One episode of vomiting 11/19 with spots of brown blood in emesis.  -EGD benefits and risks discussed including risk with sedation, risk of bleeding, perforation and infection  -Omeprazole $RemoveBefo'20mg'zapCasJlOHz$  QD -GERD handout  -No Ibuprofen or other NSAIDS -Patient to call her office if her symptoms  worsen  2. Gallstones  -Follow-up with general surgery to further discuss a future cholecystectomy  3. Weight loss, secondary to # 1        CC:  No ref. provider found

## 2020-09-16 ENCOUNTER — Other Ambulatory Visit: Payer: Self-pay | Admitting: Gastroenterology

## 2020-09-16 LAB — SARS CORONAVIRUS 2 (TAT 6-24 HRS): SARS Coronavirus 2: NEGATIVE

## 2020-09-20 ENCOUNTER — Other Ambulatory Visit: Payer: Self-pay

## 2020-09-20 ENCOUNTER — Encounter: Payer: Self-pay | Admitting: Gastroenterology

## 2020-09-20 ENCOUNTER — Ambulatory Visit (AMBULATORY_SURGERY_CENTER): Payer: Medicaid Other | Admitting: Gastroenterology

## 2020-09-20 VITALS — BP 113/65 | HR 77 | Temp 98.7°F | Resp 15 | Ht 62.0 in | Wt 122.0 lb

## 2020-09-20 DIAGNOSIS — R112 Nausea with vomiting, unspecified: Secondary | ICD-10-CM | POA: Diagnosis not present

## 2020-09-20 DIAGNOSIS — R1013 Epigastric pain: Secondary | ICD-10-CM

## 2020-09-20 DIAGNOSIS — K920 Hematemesis: Secondary | ICD-10-CM

## 2020-09-20 DIAGNOSIS — K3189 Other diseases of stomach and duodenum: Secondary | ICD-10-CM | POA: Diagnosis not present

## 2020-09-20 DIAGNOSIS — K319 Disease of stomach and duodenum, unspecified: Secondary | ICD-10-CM

## 2020-09-20 MED ORDER — SODIUM CHLORIDE 0.9 % IV SOLN: 500.0000 mL | Freq: Once | INTRAVENOUS | Status: DC

## 2020-09-20 NOTE — Progress Notes (Signed)
Called to room to assist during endoscopic procedure.  Patient ID and intended procedure confirmed with present staff. Received instructions for my participation in the procedure from the performing physician.  

## 2020-09-20 NOTE — Patient Instructions (Signed)
  USTED TUVO UN PROCEDIMIENTO ENDOSCPICO HOY EN EL [facility]:   Lea el informe del procedimiento que se le entreg para cualquier pregunta especfica sobre lo que se encontr durante su examen.  Si el informe del examen no responde a sus preguntas, por favor llame a su gastroenterlogo para aclararlo.  Si usted solicit que no se le den detalles de lo que se encontr en su procedimiento al acompaante que le va a cuidar, entonces el informe del procedimiento se ha incluido en un sobre sellado para que usted lo revise despus cuando le sea ms conveniente.   LO QUE PUEDE ESPERAR: Algunas sensaciones de hinchazn en el abdomen.  Puede tener ms gases de lo normal.  El caminar puede ayudarle a eliminar el aire que se le puso en el tracto gastrointestinal durante el procedimiento y reducir la hinchazn.  Si le hicieron una endoscopia inferior (como una colonoscopia o una sigmoidoscopia flexible), podra notar manchas de sangre en las heces fecales o en el papel higinico.  Si se someti a una preparacin intestinal para su procedimiento, es posible que no tenga una evacuacin intestinal normal durante algunos das.   Tenga en cuenta:  Es posible que note un poco de irritacin y congestin en la nariz o algn drenaje.  Esto es debido al oxgeno utilizado durante su procedimiento.  No hay que preocuparse y esto debe desaparecer ms o menos en un da.      Despus de la endoscopia superior (EGD)  Vmitos de sangre o material como caf molido   Dolor en el pecho o dolor debajo de los omplatos que antes no tena   Dolor o dificultad persistente para tragar  Falta de aire que antes no tena   Fiebre de 100F o ms  Heces fecales negras y pegajosas   Para asuntos urgentes o de emergencia, puede comunicarse con un gastroenterlogo a cualquier hora llamando al [phone].  DIETA:  Recomendamos una comida pequea al principio, pero luego puede continuar con su dieta normal.  Tome muchos  lquidos, pero debe evitar las bebidas alcohlicas durante 24 horas.    ACTIVIDAD:  Debe planear tomarse las cosas con calma por el resto del da y no debe CONDUCIR ni usar maquinaria pesada hasta maana (debido a los medicamentos de sedacin utilizados durante el examen).     SEGUIMIENTO: Nuestro personal llamar al nmero que aparece en su historial al siguiente da hbil de su procedimiento para ver cmo se siente y para responder cualquier pregunta o inquietud que pueda tener con respecto a la informacin que se le dio despus del procedimiento. Si no podemos contactarle, le dejaremos un mensaje.  Sin embargo, si se siente bien y no tiene ningn problema, no es necesario que nos devuelva la llamada.  Asumiremos que ha regresado a sus actividades diarias normales sin incidentes. Si se le tomaron algunas biopsias, le contactaremos por telfono o por carta en las prximas 3 semanas.  Si no ha sabido nada sobre las biopsias en el transcurso de 3 semanas, por favor llmenos al [phone].   FIRMAS/CONFIDENCIALIDAD: Usted y/o el acompaante que le cuide han firmado documentos que se ingresarn en su historial mdico electrnico.  Estas firmas atestiguan el hecho de que la informacin anterior        

## 2020-09-20 NOTE — Progress Notes (Signed)
VS by [place]. 

## 2020-09-20 NOTE — Progress Notes (Signed)
pt tolerated well. VSS. awake and to recovery. Report given to RN. Bite block inserted and removed without trauma. 

## 2020-09-20 NOTE — Op Note (Signed)
[facility] Patient Name: Catherine Nixon Procedure Date: 09/20/2020 9:43 AM MRN: [id number] Endoscopist: Viviann Spare P. Adela Lank , MD Age: 19 Referring MD:  Date of Birth: [birth year] Gender: Female Account #: [id number] Procedure:                Upper GI endoscopy Indications:              Epigastric abdominal pain, nausea / vomiting,                            history of hematemesis (small volume), gallstones                            noted on [place], PPI has not helped Medicines:                Monitored Anesthesia Care Procedure:                Pre-Anesthesia Assessment:                           - Prior to the procedure, a History and Physical                            was performed, and patient medications and                            allergies were reviewed. The patient's tolerance of                            previous anesthesia was also reviewed. The risks                            and benefits of the procedure and the sedation                            options and risks were discussed with the patient.                            All questions were answered, and informed consent                            was obtained. Prior Anticoagulants: The patient has                            taken no previous anticoagulant or antiplatelet                            agents. ASA Grade Assessment: I - A normal, healthy                            patient. After reviewing the risks and benefits,                            the patient was deemed in satisfactory condition to  undergo the procedure.                           After obtaining informed consent, the endoscope was                            passed under direct vision. Throughout the                            procedure, the patient's blood pressure, pulse, and                            oxygen saturations were monitored continuously. The                            Endoscope was introduced  through the mouth, and                            advanced to the second part of duodenum. The upper                            GI endoscopy was accomplished without difficulty.                            The patient tolerated the procedure well. Scope In: Scope Out: Findings:                 Esophagogastric landmarks were identified: the                            Z-line was found at 30 cm, the gastroesophageal                            junction was found at 30 cm and the upper extent of                            the gastric folds was found at 30 cm from the                            incisors.                           The exam of the esophagus was otherwise normal.                           The entire examined stomach was normal. Biopsies                            were taken with a cold forceps for Helicobacter                            pylori testing.                           The duodenal bulb and second portion  of the                            duodenum were normal. Complications:            No immediate complications. Estimated blood loss:                            Minimal. Estimated Blood Loss:     Estimated blood loss was minimal. Impression:               - Esophagogastric landmarks identified.                           - Normal esophagus otherwise.                           - Normal stomach. Biopsied to rule out H pylori.                           - Normal duodenal bulb and second portion of the                            duodenum.                           No overt cause for symptoms on this exam. Suspect                            prior hematemesis could have been due to self                            limited Mallory weiss tear in setting of vomiting.                            Gallstones may be the more likely cause of pain                            given her workup to date and lack of improvement                            with PPI. Recommendation:           -  Patient has a contact number available for                            emergencies. The signs and symptoms of potential                            delayed complications were discussed with the                            patient. Return to normal activities tomorrow.                            Written discharge instructions were provided to the  patient.                           - Resume previous diet.                           - Continue present medications.                           - Await pathology results.                           - Follow up with general surgery to discuss                            cholecystectomy Catherine Nixon P. Osias Resnick, MD 09/20/2020 10:03:21 AM This report has been signed electronically.

## 2020-09-22 ENCOUNTER — Telehealth: Payer: Self-pay | Admitting: *Deleted

## 2020-09-22 NOTE — Telephone Encounter (Signed)
°  Follow up Call-  Call back number 09/20/2020  Post procedure Call Back phone  # [phone] iveth, sister  Permission to leave phone message Yes  Some recent data might be hidden     Patient questions:  Do you have a fever, pain , or abdominal swelling? No. Pain Score  0 *  Have you tolerated food without any problems? Yes.    Have you been able to return to your normal activities? Yes.    Do you have any questions about your discharge instructions: Diet   No. Medications  No. Follow up visit  No.  Do you have questions or concerns about your Care? No.  Actions: * If pain score is 4 or above: No action needed, pain <4.  1. Have you developed a fever since your procedure? no  2.   Have you had an respiratory symptoms (SOB or cough) since your procedure? no  3.   Have you tested positive for COVID 19 since your procedure no  4.   Have you had any family members/close contacts diagnosed with the COVID 19 since your procedure? no   If yes to any of these questions please route to Laverna Peace, RN and Karlton Lemon, RN

## 2020-09-26 ENCOUNTER — Telehealth: Payer: Self-pay | Admitting: General Practice

## 2020-09-26 NOTE — Telephone Encounter (Signed)
Copied from CRM [id number]. Topic: General - Other >> Sep 26, 2020 10:33 AM Herby Abraham C wrote: Reason for CRM: Misty Stanley with [facility] called in to request to have pt medical records re-faxed to her for pt's referral. She says that everything didn't come through to her.     Phone: [phone]  Fax: [phone]  Please follow up.

## 2020-10-05 ENCOUNTER — Ambulatory Visit (INDEPENDENT_AMBULATORY_CARE_PROVIDER_SITE_OTHER): Payer: Medicaid Other | Admitting: Primary Care

## 2020-10-26 DIAGNOSIS — K8020 Calculus of gallbladder without cholecystitis without obstruction: Secondary | ICD-10-CM | POA: Diagnosis not present

## 2020-10-28 ENCOUNTER — Emergency Department (HOSPITAL_COMMUNITY)
Admission: EM | Admit: 2020-10-28 | Discharge: 2020-10-29 | Disposition: A | Discharge status: Home / Self Care | Payer: Medicaid Other | Attending: Emergency Medicine | Admitting: Emergency Medicine

## 2020-10-28 ENCOUNTER — Other Ambulatory Visit: Payer: Self-pay

## 2020-10-28 ENCOUNTER — Encounter (HOSPITAL_COMMUNITY): Payer: Self-pay | Admitting: Emergency Medicine

## 2020-10-28 DIAGNOSIS — R1011 Right upper quadrant pain: Secondary | ICD-10-CM | POA: Diagnosis present

## 2020-10-28 DIAGNOSIS — K8020 Calculus of gallbladder without cholecystitis without obstruction: Secondary | ICD-10-CM

## 2020-10-28 LAB — URINALYSIS, ROUTINE W REFLEX MICROSCOPIC
Bilirubin Urine: NEGATIVE
Glucose, UA: NEGATIVE mg/dL
Ketones, ur: 5 mg/dL — AB
Leukocytes,Ua: NEGATIVE
Nitrite: POSITIVE — AB
Protein, ur: NEGATIVE mg/dL
Specific Gravity, Urine: 1.028 (ref 1.005–1.030)
pH: 5 (ref 5.0–8.0)

## 2020-10-28 LAB — COMPREHENSIVE METABOLIC PANEL
ALT: 16 U/L (ref 0–44)
AST: 17 U/L (ref 15–41)
Albumin: 4 g/dL (ref 3.5–5.0)
Alkaline Phosphatase: 81 U/L (ref 38–126)
Anion gap: 10 (ref 5–15)
BUN: 18 mg/dL (ref 6–20)
CO2: 22 mmol/L (ref 22–32)
Calcium: 8.8 mg/dL — ABNORMAL LOW (ref 8.9–10.3)
Chloride: 104 mmol/L (ref 98–111)
Creatinine, Ser: 0.59 mg/dL (ref 0.44–1.00)
GFR, Estimated: >60 mL/min (ref >60)
Glucose, Bld: 106 mg/dL — ABNORMAL HIGH (ref 70–99)
Potassium: 3.5 mmol/L (ref 3.5–5.1)
Sodium: 136 mmol/L (ref 135–145)
Total Bilirubin: 0.5 mg/dL (ref 0.3–1.2)
Total Protein: 7 g/dL (ref 6.5–8.1)

## 2020-10-28 LAB — CBC
HCT: 38.8 % (ref 36.0–46.0)
Hemoglobin: 12.9 g/dL (ref 12.0–15.0)
MCH: 28.5 pg (ref 26.0–34.0)
MCHC: 33.2 g/dL (ref 30.0–36.0)
MCV: 85.8 fL (ref 80.0–100.0)
Platelets: 303 10*3/uL (ref 150–400)
RBC: 4.52 MIL/uL (ref 3.87–5.11)
RDW: 13 % (ref 11.5–15.5)
WBC: 7 10*3/uL (ref 4.0–10.5)
nRBC: 0 % (ref 0.0–0.2)

## 2020-10-28 LAB — I-STAT BETA HCG BLOOD, ED (MC, WL, AP ONLY): I-stat hCG, quantitative: <5 m[IU]/mL (ref <5)

## 2020-10-28 LAB — LIPASE, BLOOD: Lipase: 25 U/L (ref 11–51)

## 2020-10-28 MED ORDER — OXYCODONE-ACETAMINOPHEN 5-325 MG PO TABS
1.0000 | ORAL_TABLET | Freq: Once | ORAL | Status: AC
  Administered 2020-10-29: 1 via ORAL
  Filled 2020-10-28: qty 1

## 2020-10-28 MED ORDER — ONDANSETRON 4 MG PO TBDP
4.0000 mg | ORAL_TABLET | Freq: Once | ORAL | Status: AC
  Administered 2020-10-29: 4 mg via ORAL
  Filled 2020-10-28: qty 1

## 2020-10-28 NOTE — ED Triage Notes (Signed)
Patient reports intermittent mid/right abdominal pain with occasional emesis for 1 month , no diarrhea or fever .

## 2020-10-29 ENCOUNTER — Emergency Department (HOSPITAL_COMMUNITY): Payer: Medicaid Other

## 2020-10-29 DIAGNOSIS — K8020 Calculus of gallbladder without cholecystitis without obstruction: Secondary | ICD-10-CM | POA: Diagnosis not present

## 2020-10-29 MED ORDER — OXYCODONE-ACETAMINOPHEN 5-325 MG PO TABS: 1.0000 | ORAL_TABLET | ORAL | 0 refills | Status: DC | PRN

## 2020-10-29 MED ORDER — ONDANSETRON 4 MG PO TBDP: 4.0000 mg | ORAL_TABLET | Freq: Three times a day (TID) | ORAL | 0 refills | Status: DC | PRN

## 2020-10-29 NOTE — ED Provider Notes (Signed)
[facility] EMERGENCY DEPARTMENT Provider Note   CSN: [id number] Arrival date & time: 10/28/20  2035     History Chief Complaint  Patient presents with  . Abdominal Pain    Catherine Nixon is a 20 y.o. female.  The history is provided by the patient and medical records.  Abdominal Pain Associated symptoms: nausea     19 y.o. F with hx of anemia, presenting to the ED for right upper/epigastric abdominal pain.  States this has been ongoing for about 3 months, some days are better than others.  Sometimes she has pain all day, other days just an hour or so.  She does notice increased pain with eating/drinking.  She does not eat a ton of spicy foods.  She denies burning sensation or feeling of acid reflux.  She has had some nausea but denies vomiting.  No diarrhea.  No fever/chills.  No hx of gallbladder disease.  No prior abdominal surgeries.  Past Medical History:  Diagnosis Date  . Anemia    hx of  . Medical history non-contributory     Patient Active Problem List   Diagnosis Date Noted  . Abdominal pain, epigastric 09/15/2020  . Nausea with vomiting 09/15/2020  . Vaginal delivery 10/13/2017  . Post term pregnancy at [redacted] weeks gestation 10/12/2017    Past Surgical History:  Procedure Laterality Date  . NO PAST SURGERIES       OB History    Gravida  1   Para  1   Term  1   Preterm      AB      Living  1     SAB      IAB      Ectopic      Multiple  0   Live Births  1           Family History  Problem Relation Age of Onset  . Colon cancer Mother   . Diabetes Father   . Asthma Neg Hx   . Heart disease Neg Hx   . Hypertension Neg Hx   . Stroke Neg Hx   . Esophageal cancer Neg Hx   . Pancreatic cancer Neg Hx   . Liver disease Neg Hx     Social History   Tobacco Use  . Smoking status: Never Smoker  . Smokeless tobacco: Never Used  Vaping Use  . Vaping Use: Never used  Substance Use Topics  . Alcohol use: No  . Drug  use: No    Home Medications Prior to Admission medications   Not on File    Allergies    Patient has no known allergies.  Review of Systems   Review of Systems  Gastrointestinal: Positive for abdominal pain and nausea.  All other systems reviewed and are negative.   Physical Exam Updated Vital Signs BP 112/70   Pulse 77   Temp 98.4 F (36.9 C)   Resp 17   LMP 10/25/2020   SpO2 100%   Physical Exam Vitals and nursing note reviewed.  Constitutional:      Appearance: She is well-developed and well-nourished.  HENT:     Head: Normocephalic and atraumatic.     Mouth/Throat:     Mouth: Oropharynx is clear and moist.  Eyes:     Extraocular Movements: EOM normal.     Conjunctiva/sclera: Conjunctivae normal.     Pupils: Pupils are equal, round, and reactive to light.  Cardiovascular:     Rate and  Rhythm: Normal rate and regular rhythm.     Heart sounds: Normal heart sounds.  Pulmonary:     Effort: Pulmonary effort is normal.     Breath sounds: Normal breath sounds.  Abdominal:     General: Bowel sounds are normal.     Palpations: Abdomen is soft.     Tenderness: There is abdominal tenderness in the right upper quadrant and epigastric area.  Musculoskeletal:        General: Normal range of motion.     Cervical back: Normal range of motion.  Skin:    General: Skin is warm and dry.  Neurological:     Mental Status: She is alert and oriented to person, place, and time.  Psychiatric:        Mood and Affect: Mood and affect normal.     ED Results / Procedures / Treatments   Labs (all labs ordered are listed, but only abnormal results are displayed) Labs Reviewed  COMPREHENSIVE METABOLIC PANEL - Abnormal; Notable for the following components:      Result Value   Glucose, Bld 106 (*)    Calcium 8.8 (*)    All other components within normal limits  URINALYSIS, ROUTINE W REFLEX MICROSCOPIC - Abnormal; Notable for the following components:   APPearance HAZY (*)     Hgb urine dipstick LARGE (*)    Ketones, ur 5 (*)    Nitrite POSITIVE (*)    Bacteria, UA MANY (*)    All other components within normal limits  LIPASE, BLOOD  CBC  I-STAT BETA HCG BLOOD, ED ([facility], [facility], [facility] ONLY)    EKG None  Radiology US Abdomen Limited RUQ (LIVER/GB)  Result Date: 10/29/2020 CLINICAL DATA:  Right upper quadrant pain. EXAM: ULTRASOUND ABDOMEN LIMITED RIGHT UPPER QUADRANT COMPARISON:  08/26/2020 FINDINGS: Gallbladder: Multiple stones in the gallbladder. Largest measures 1.8 cm diameter. Gallbladder is contracted. Patient indicates NPO for 6 hours. Murphy's sign is negative but is unreliable as the patient has had pain medication. No wall thickening or edema. Common bile duct: Diameter: 2 mm, normal Liver: A 5 mm shadowing echogenic focus is demonstrated in the left liver, likely representing a granuloma. Otherwise, no focal liver lesions are identified. Portal vein is patent on color Doppler imaging with normal direction of blood flow towards the liver. Other: None. IMPRESSION: Cholelithiasis. No additional changes to suggest acute cholecystitis. Murphy's sign is negative but unreliable due to pain medication. Electronically Signed   By: Burman Nieves M.D.   On: 10/29/2020 00:52    Procedures Procedures (including critical care time)  Medications Ordered in ED Medications  oxyCODONE-acetaminophen (PERCOCET/ROXICET) 5-325 MG per tablet 1 tablet (1 tablet Oral Given 10/29/20 0003)  ondansetron (ZOFRAN-ODT) disintegrating tablet 4 mg (4 mg Oral Given 10/29/20 0003)    ED Course  I have reviewed the triage vital signs and the nursing notes.  Pertinent labs & imaging results that were available during my care of the patient were reviewed by me and considered in my medical decision making (see chart for details).    MDM Rules/Calculators/A&P  20 year old female presenting to the ED with epigastric and right upper abdominal pain.  This has been an intermittent problem over  the past 3 months.  Symptoms are worse after eating or drinking.  Has had nausea without vomiting.  She is afebrile and nontoxic.  Does have epigastric and right upper quadrant tenderness without Murphy sign.  Her labs are reassuring.  Ultrasound was obtained, gallstones noted without findings of  acute cholecystitis.  She is feeling better after treatment here.  Urine is nitrite positive, however she is completely asymptomatic without any urinary frequency, dysuria, fever, or flank pain.  She is young, otherwise healthy female, will hold off on antibiotics at this time.  Plan to discharge home with pain medication and close follow-up with general surgery.  Encouraged low-fat diet.  She may return here for any new or acute changes.  Final Clinical Impression(s) / ED Diagnoses Final diagnoses:  RUQ pain  Gallstones    Rx / DC Orders ED Discharge Orders         Ordered    oxyCODONE-acetaminophen (PERCOCET) 5-325 MG tablet  Every 4 hours PRN        10/29/20 0133    ondansetron (ZOFRAN ODT) 4 MG disintegrating tablet  Every 8 hours PRN        10/29/20 0133           Garlon Hatchet, PA-C 10/29/20 0151    Gilda Crease, MD 10/29/20 [phone]

## 2020-10-29 NOTE — Discharge Instructions (Signed)
Your ultrasound today did show findings of gallstones.  Recommend you try to eat a low-fat diet to help reduce flareups.  See attached for more information. Take the prescribed medication as directed.  Do not drive while taking pain medicine. Follow-up with the [facility] surgery office--call Monday to get appointment scheduled. Return to the ED for new or worsening symptoms.

## 2020-11-01 ENCOUNTER — Telehealth: Payer: Self-pay

## 2020-11-01 NOTE — Telephone Encounter (Signed)
Transition Care Management Follow-up Telephone Call  Date of discharge and from where: 10/29/2020 [facility]  How have you been since you were released from the hospital? Still having some pain. She has been taking the oxycodone stated it helps her pain for about 1 hour. But it is manageable.   Any questions or concerns? No  Items Reviewed:  Did the pt receive and understand the discharge instructions provided? Yes   Medications obtained and verified? Yes   Other? No   Any new allergies since your discharge? Yes   Dietary orders reviewed? Yes  Do you have support at home? Yes    Functional Questionnaire: (I = Independent and D = Dependent) ADLs: I  Bathing/Dressing- I  Meal Prep- I  Eating- I  Maintaining continence- I  Transferring/Ambulation- I  Managing Meds- I  Follow up appointments reviewed:   PCP Hospital f/u appt confirmed? No    Specialist Hospital f/u appt confirmed? Yes  Scheduled to see [facility] on 11/08/2020 @ 1:30pm.  Are transportation arrangements needed? No   If their condition worsens, is the pt aware to call PCP or go to the Emergency Dept.? Yes  Was the patient provided with contact information for the PCP's office or ED? Yes  Was to pt encouraged to call back with questions or concerns? Yes

## 2020-11-09 ENCOUNTER — Ambulatory Visit: Payer: Self-pay | Admitting: General Surgery

## 2020-11-11 ENCOUNTER — Other Ambulatory Visit (HOSPITAL_COMMUNITY)
Admission: RE | Admit: 2020-11-11 | Discharge: 2020-11-11 | Disposition: A | Discharge status: Home / Self Care | Payer: Medicaid Other | Source: Ambulatory Visit | Attending: General Surgery | Admitting: General Surgery

## 2020-11-11 ENCOUNTER — Other Ambulatory Visit: Payer: Self-pay

## 2020-11-11 DIAGNOSIS — Z20822 Contact with and (suspected) exposure to covid-19: Secondary | ICD-10-CM | POA: Insufficient documentation

## 2020-11-11 DIAGNOSIS — Z01812 Encounter for preprocedural laboratory examination: Secondary | ICD-10-CM | POA: Insufficient documentation

## 2020-11-11 LAB — SARS CORONAVIRUS 2 (TAT 6-24 HRS): SARS Coronavirus 2: NEGATIVE

## 2020-11-11 NOTE — Progress Notes (Signed)
I spoke with Catherine Nixon using a [employer], ID# [id number]. Patient denies chest pain or shortness of breath. Ms Catherine Nixon denies any s/s of Covid, patient was tested for Covid today and is aware that she needs to be in quarantine with just the people that love in her home.

## 2020-11-15 ENCOUNTER — Ambulatory Visit (HOSPITAL_COMMUNITY): Payer: Medicaid Other | Admitting: Anesthesiology

## 2020-11-15 ENCOUNTER — Ambulatory Visit (HOSPITAL_COMMUNITY)
Admission: RE | Admit: 2020-11-15 | Discharge: 2020-11-15 | Disposition: A | Discharge status: Home / Self Care | Payer: Medicaid Other | Attending: General Surgery | Admitting: General Surgery

## 2020-11-15 ENCOUNTER — Encounter (HOSPITAL_COMMUNITY): Payer: Self-pay | Admitting: General Surgery

## 2020-11-15 ENCOUNTER — Other Ambulatory Visit: Payer: Self-pay

## 2020-11-15 ENCOUNTER — Encounter (HOSPITAL_COMMUNITY)
Admission: RE | Disposition: A | Discharge status: Home / Self Care | Payer: Self-pay | Source: Home / Self Care | Attending: General Surgery

## 2020-11-15 DIAGNOSIS — K8020 Calculus of gallbladder without cholecystitis without obstruction: Secondary | ICD-10-CM | POA: Diagnosis not present

## 2020-11-15 DIAGNOSIS — Z833 Family history of diabetes mellitus: Secondary | ICD-10-CM | POA: Insufficient documentation

## 2020-11-15 DIAGNOSIS — Z841 Family history of disorders of kidney and ureter: Secondary | ICD-10-CM | POA: Insufficient documentation

## 2020-11-15 DIAGNOSIS — K8010 Calculus of gallbladder with chronic cholecystitis without obstruction: Secondary | ICD-10-CM | POA: Diagnosis not present

## 2020-11-15 HISTORY — PX: CHOLECYSTECTOMY: SHX55

## 2020-11-15 LAB — CBC
HCT: 42.7 % (ref 36.0–46.0)
Hemoglobin: 13.4 g/dL (ref 12.0–15.0)
MCH: 28.7 pg (ref 26.0–34.0)
MCHC: 31.4 g/dL (ref 30.0–36.0)
MCV: 91.4 fL (ref 80.0–100.0)
Platelets: 246 10*3/uL (ref 150–400)
RBC: 4.67 MIL/uL (ref 3.87–5.11)
RDW: 13.2 % (ref 11.5–15.5)
WBC: 7 10*3/uL (ref 4.0–10.5)
nRBC: 0 % (ref 0.0–0.2)

## 2020-11-15 LAB — POCT PREGNANCY, URINE: Preg Test, Ur: NEGATIVE

## 2020-11-15 SURGERY — LAPAROSCOPIC CHOLECYSTECTOMY
Anesthesia: General | Site: Abdomen

## 2020-11-15 MED ORDER — STERILE WATER FOR IRRIGATION IR SOLN
Status: DC | PRN
  Administered 2020-11-15: 1000 mL

## 2020-11-15 MED ORDER — DEXAMETHASONE SODIUM PHOSPHATE 10 MG/ML IJ SOLN
INTRAMUSCULAR | Status: AC
  Filled 2020-11-15: qty 1

## 2020-11-15 MED ORDER — ONDANSETRON HCL 4 MG/2ML IJ SOLN
INTRAMUSCULAR | Status: AC
  Filled 2020-11-15: qty 2

## 2020-11-15 MED ORDER — SUGAMMADEX SODIUM 200 MG/2ML IV SOLN
INTRAVENOUS | Status: DC | PRN
  Administered 2020-11-15: 200 mg via INTRAVENOUS

## 2020-11-15 MED ORDER — ONDANSETRON HCL 4 MG/2ML IJ SOLN
INTRAMUSCULAR | Status: DC | PRN
  Administered 2020-11-15: 4 mg via INTRAVENOUS

## 2020-11-15 MED ORDER — ORAL CARE MOUTH RINSE: 15.0000 mL | Freq: Once | OROMUCOSAL | Status: AC

## 2020-11-15 MED ORDER — TRAMADOL HCL 50 MG PO TABS
50.0000 mg | ORAL_TABLET | Freq: Once | ORAL | Status: AC | PRN
  Administered 2020-11-15: 50 mg via ORAL

## 2020-11-15 MED ORDER — TRAMADOL HCL 50 MG PO TABS
ORAL_TABLET | ORAL | Status: AC
  Filled 2020-11-15: qty 1

## 2020-11-15 MED ORDER — ROCURONIUM BROMIDE 100 MG/10ML IV SOLN
INTRAVENOUS | Status: DC | PRN
  Administered 2020-11-15: 50 mg via INTRAVENOUS

## 2020-11-15 MED ORDER — TRAMADOL HCL 50 MG PO TABS: 50.0000 mg | ORAL_TABLET | Freq: Four times a day (QID) | ORAL | 0 refills | Status: DC | PRN

## 2020-11-15 MED ORDER — CEFAZOLIN SODIUM-DEXTROSE 2-4 GM/100ML-% IV SOLN
2.0000 g | INTRAVENOUS | Status: AC
  Administered 2020-11-15: 2 g via INTRAVENOUS
  Filled 2020-11-15: qty 100

## 2020-11-15 MED ORDER — FENTANYL CITRATE (PF) 100 MCG/2ML IJ SOLN
25.0000 ug | INTRAMUSCULAR | Status: DC | PRN
  Administered 2020-11-15 (×2): 50 ug via INTRAVENOUS

## 2020-11-15 MED ORDER — CHLORHEXIDINE GLUCONATE CLOTH 2 % EX PADS: 6.0000 | MEDICATED_PAD | Freq: Once | CUTANEOUS | Status: DC

## 2020-11-15 MED ORDER — FENTANYL CITRATE (PF) 250 MCG/5ML IJ SOLN
INTRAMUSCULAR | Status: DC | PRN
  Administered 2020-11-15 (×2): 50 ug via INTRAVENOUS
  Administered 2020-11-15: 100 ug via INTRAVENOUS

## 2020-11-15 MED ORDER — CHLORHEXIDINE GLUCONATE 0.12 % MT SOLN
OROMUCOSAL | Status: AC
  Administered 2020-11-15: 15 mL via OROMUCOSAL
  Filled 2020-11-15: qty 15

## 2020-11-15 MED ORDER — BUPIVACAINE HCL 0.25 % IJ SOLN
INTRAMUSCULAR | Status: DC | PRN
  Administered 2020-11-15: 9 mL

## 2020-11-15 MED ORDER — MIDAZOLAM HCL 5 MG/5ML IJ SOLN
INTRAMUSCULAR | Status: DC | PRN
  Administered 2020-11-15: 2 mg via INTRAVENOUS

## 2020-11-15 MED ORDER — ENSURE PRE-SURGERY PO LIQD: 296.0000 mL | Freq: Once | ORAL | Status: DC

## 2020-11-15 MED ORDER — CHLORHEXIDINE GLUCONATE 0.12 % MT SOLN: 15.0000 mL | Freq: Once | OROMUCOSAL | Status: AC

## 2020-11-15 MED ORDER — DEXMEDETOMIDINE (PRECEDEX) IN NS 20 MCG/5ML (4 MCG/ML) IV SYRINGE
PREFILLED_SYRINGE | INTRAVENOUS | Status: DC | PRN
  Administered 2020-11-15: 12 ug via INTRAVENOUS
  Administered 2020-11-15: 4 ug via INTRAVENOUS

## 2020-11-15 MED ORDER — PROPOFOL 10 MG/ML IV BOLUS
INTRAVENOUS | Status: AC
  Filled 2020-11-15: qty 40

## 2020-11-15 MED ORDER — FENTANYL CITRATE (PF) 100 MCG/2ML IJ SOLN
INTRAMUSCULAR | Status: AC
  Filled 2020-11-15: qty 2

## 2020-11-15 MED ORDER — ACETAMINOPHEN 500 MG PO TABS
1000.0000 mg | ORAL_TABLET | ORAL | Status: AC
  Administered 2020-11-15: 1000 mg via ORAL
  Filled 2020-11-15: qty 2

## 2020-11-15 MED ORDER — MIDAZOLAM HCL 2 MG/2ML IJ SOLN
INTRAMUSCULAR | Status: AC
  Filled 2020-11-15: qty 2

## 2020-11-15 MED ORDER — ROCURONIUM BROMIDE 10 MG/ML (PF) SYRINGE
PREFILLED_SYRINGE | INTRAVENOUS | Status: AC
  Filled 2020-11-15: qty 10

## 2020-11-15 MED ORDER — LIDOCAINE 2% (20 MG/ML) 5 ML SYRINGE
INTRAMUSCULAR | Status: DC | PRN
  Administered 2020-11-15: 50 mg via INTRAVENOUS

## 2020-11-15 MED ORDER — DEXMEDETOMIDINE (PRECEDEX) IN NS 20 MCG/5ML (4 MCG/ML) IV SYRINGE
PREFILLED_SYRINGE | INTRAVENOUS | Status: AC
  Filled 2020-11-15: qty 5

## 2020-11-15 MED ORDER — LIDOCAINE 2% (20 MG/ML) 5 ML SYRINGE
INTRAMUSCULAR | Status: AC
  Filled 2020-11-15: qty 5

## 2020-11-15 MED ORDER — FENTANYL CITRATE (PF) 250 MCG/5ML IJ SOLN
INTRAMUSCULAR | Status: AC
  Filled 2020-11-15: qty 5

## 2020-11-15 MED ORDER — 0.9 % SODIUM CHLORIDE (POUR BTL) OPTIME
TOPICAL | Status: DC | PRN
  Administered 2020-11-15: 1000 mL

## 2020-11-15 MED ORDER — LACTATED RINGERS IV SOLN: INTRAVENOUS | Status: DC

## 2020-11-15 MED ORDER — DEXAMETHASONE SODIUM PHOSPHATE 10 MG/ML IJ SOLN
INTRAMUSCULAR | Status: DC | PRN
  Administered 2020-11-15: 5 mg via INTRAVENOUS

## 2020-11-15 MED ORDER — PROPOFOL 10 MG/ML IV BOLUS
INTRAVENOUS | Status: DC | PRN
  Administered 2020-11-15: 10 mg via INTRAVENOUS
  Administered 2020-11-15: 120 mg via INTRAVENOUS
  Administered 2020-11-15 (×2): 10 mg via INTRAVENOUS
  Administered 2020-11-15: 20 mg via INTRAVENOUS

## 2020-11-15 MED ORDER — SODIUM CHLORIDE 0.9 % IR SOLN
Status: DC | PRN
  Administered 2020-11-15: 1000 mL

## 2020-11-15 MED ORDER — BUPIVACAINE HCL (PF) 0.25 % IJ SOLN
INTRAMUSCULAR | Status: AC
  Filled 2020-11-15: qty 30

## 2020-11-15 SURGICAL SUPPLY — 36 items
CANISTER SUCT 3000ML PPV (MISCELLANEOUS) ×2 IMPLANT
CHLORAPREP W/TINT 26 (MISCELLANEOUS) ×2 IMPLANT
CLIP VESOLOCK MED LG 6/CT (CLIP) ×2 IMPLANT
COVER SURGICAL LIGHT HANDLE (MISCELLANEOUS) ×2 IMPLANT
COVER TRANSDUCER ULTRASND (DRAPES) ×2 IMPLANT
COVER WAND RF STERILE (DRAPES) ×2 IMPLANT
DERMABOND ADVANCED (GAUZE/BANDAGES/DRESSINGS) ×1
DERMABOND ADVANCED .7 DNX12 (GAUZE/BANDAGES/DRESSINGS) ×1 IMPLANT
ELECT REM PT RETURN 9FT ADLT (ELECTROSURGICAL) ×2
ELECTRODE REM PT RTRN 9FT ADLT (ELECTROSURGICAL) ×1 IMPLANT
GLOVE BIO SURGEON STRL SZ7.5 (GLOVE) ×2 IMPLANT
GOWN STRL REUS W/ TWL LRG LVL3 (GOWN DISPOSABLE) ×2 IMPLANT
GOWN STRL REUS W/ TWL XL LVL3 (GOWN DISPOSABLE) ×1 IMPLANT
GOWN STRL REUS W/TWL LRG LVL3 (GOWN DISPOSABLE) ×4
GOWN STRL REUS W/TWL XL LVL3 (GOWN DISPOSABLE) ×2
GRASPER SUT TROCAR 14GX15 (MISCELLANEOUS) ×2 IMPLANT
KIT BASIN OR (CUSTOM PROCEDURE TRAY) ×2 IMPLANT
KIT TURNOVER KIT B (KITS) ×2 IMPLANT
NEEDLE INSUFFLATION 14GA 120MM (NEEDLE) ×2 IMPLANT
NS IRRIG 1000ML POUR BTL (IV SOLUTION) ×2 IMPLANT
PAD ARMBOARD 7.5X6 YLW CONV (MISCELLANEOUS) ×2 IMPLANT
POUCH LAPAROSCOPIC INSTRUMENT (MISCELLANEOUS) ×2 IMPLANT
POUCH RETRIEVAL ECOSAC 10 (ENDOMECHANICALS) ×1 IMPLANT
POUCH RETRIEVAL ECOSAC 10MM (ENDOMECHANICALS) ×2
SCISSORS LAP 5X35 DISP (ENDOMECHANICALS) ×2 IMPLANT
SET IRRIG TUBING LAPAROSCOPIC (IRRIGATION / IRRIGATOR) ×2 IMPLANT
SET TUBE SMOKE EVAC HIGH FLOW (TUBING) ×2 IMPLANT
SLEEVE ENDOPATH XCEL 5M (ENDOMECHANICALS) ×4 IMPLANT
SUT MNCRL AB 4-0 PS2 18 (SUTURE) ×2 IMPLANT
TOWEL GREEN STERILE (TOWEL DISPOSABLE) ×2 IMPLANT
TOWEL GREEN STERILE FF (TOWEL DISPOSABLE) ×2 IMPLANT
TRAY LAPAROSCOPIC MC (CUSTOM PROCEDURE TRAY) ×2 IMPLANT
TROCAR XCEL NON-BLD 11X100MML (ENDOMECHANICALS) ×2 IMPLANT
TROCAR XCEL NON-BLD 5MMX100MML (ENDOMECHANICALS) ×2 IMPLANT
WARMER LAPAROSCOPE (MISCELLANEOUS) ×2 IMPLANT
WATER STERILE IRR 1000ML POUR (IV SOLUTION) ×2 IMPLANT

## 2020-11-15 NOTE — H&P (Signed)
History of Present Illness Axel Filler MD; 10/26/2020 11:32 AM) The patient is a 20 year old female who presents for evaluation of gall stones. Patient is a 20 year old female, otherwise healthy,bleeding, comes in secondary to abdominal pain. Patient states that she's had 3 years of abdominal pain has gotten worse. She states that this is associated with high fatty foods. She states that the pain usually last about 3 hours. She states this is associated with nausea and vomiting. Patient had recently while in the er was found to have numerous gallstones. he reviewed this personally. patient's lfts within normal limits.  patient had no previous abdominal surgery.   Past Surgical History (Chanel Lonni Fix, CMA; 10/26/2020 11:12 AM) No pertinent past surgical history   Diagnostic Studies History (Chanel Lonni Fix, CMA; 10/26/2020 11:12 AM) Colonoscopy  never Mammogram  never Pap Smear  1-5 years ago  Allergies (Chanel Lonni Fix, CMA; 10/26/2020 11:12 AM) No Known Drug Allergies  [10/26/2020]: Allergies Reconciled   Medication History (Chanel Lonni Fix, CMA; 10/26/2020 11:12 AM) No Current Medications Medications Reconciled  Family History (Chanel Lonni Fix, CMA; 10/26/2020 11:12 AM) Diabetes Mellitus  Father. Kidney Disease  Daughter.  Pregnancy / Birth History Micheal Likens, CMA; 10/26/2020 11:12 AM) Age at menarche  5 years. Gravida  1 Length (months) of breastfeeding  3-6 Maternal age  3-20 Para  1  Other Problems (Chanel Lonni Fix, CMA; 10/26/2020 11:12 AM) Back Pain     Review of Systems Axel Filler MD; 10/26/2020 11:31 AM) General Present- Weight Loss. Not Present- Appetite Loss, Chills, Fatigue, Fever, Night Sweats and Weight Gain. HEENT Present- Yellow Eyes. Not Present- Earache, Hearing Loss, Hoarseness, Nose Bleed, Oral Ulcers, Ringing in the Ears, Seasonal Allergies, Sinus Pain, Sore Throat, Visual Disturbances and Wears glasses/contact lenses. Respiratory Present-  Snoring. Not Present- Bloody sputum, Chronic Cough, Difficulty Breathing and Wheezing. Cardiovascular Not Present- Chest Pain. Gastrointestinal Present- Abdominal Pain. Not Present- Bloating, Bloody Stool, Change in Bowel Habits, Chronic diarrhea, Constipation, Difficulty Swallowing, Excessive gas, Gets full quickly at meals, Hemorrhoids, Indigestion, Nausea, Rectal Pain and Vomiting. Musculoskeletal Not Present- Myalgia. Neurological Not Present- Weakness. All other systems negative  Vitals (Chanel Nolan CMA; 10/26/2020 11:13 AM) 10/26/2020 11:12 AM Weight: 122.5 lb Height: 62in Body Surface Area: 1.55 m Body Mass Index: 22.41 kg/m  Temp.: 74F        Physical Exam Axel Filler MD; 10/26/2020 11:32 AM) The physical exam findings are as follows: Note: Constitutional: No acute distress, conversant, appears stated age  Eyes: Anicteric sclerae, moist conjunctiva, no lid lag  Neck: No thyromegaly, trachea midline, no cervical lymphadenopathy  Lungs: Clear to auscultation biilaterally, normal respiratory effot  Cardiovascular: regular rate & rhythm, no murmurs, no peripheal edema, pedal pulses 2+  GI: Soft, no masses or hepatosplenomegaly, tender to palpation right upper quadrant  MSK: Normal gait, no clubbing cyanosis, edema  Skin: No rashes, palpation reveals normal skin turgor  Psychiatric: Appropriate judgment and insight, oriented to person, place, and time    Assessment & Plan Axel Filler MD; 10/26/2020 11:32 AM) SYMPTOMATIC CHOLELITHIASIS (K80.20) Impression: Patient is a 20 year old female with symptomatic cholelithiasis  1. We will proceed to the operating room for a laparoscopic cholecystectomy  2. Risks and benefits were discussed with the patient to generally include, but not limited to: infection, bleeding, possible need for post op ERCP, damage to the bile ducts, bile leak, and possible need for further surgery. Alternatives were offered and  described. All questions were answered and the patient voiced understanding of the procedure and wishes  to proceed at this point with a laparoscopic cholecystectomy

## 2020-11-15 NOTE — Op Note (Signed)
PATIENT:  Catherine Nixon 20 y.o. female  PRE-OPERATIVE DIAGNOSIS:  Symptomatic cholelithiasis  POST-OPERATIVE DIAGNOSIS:  Symptomatic cholelithiasis  PROCEDURE:  LAPAROSCOPIC CHOLECYSTECTOMY (N/A)  SURGEON:  Surgeon(s) and Role:    * Axel Filler, MD - Primary  ASSISTANTS: Tarita Deshmukh, MD   ANESTHESIA:   local and general  EBL:  5 mL   BLOOD ADMINISTERED: None  DRAINS: None   LOCAL MEDICATIONS USED:  Bupivacaine   SPECIMEN:  Gallbladder  DISPOSITION OF SPECIMEN:  PATHOLOGY  COUNTS:  Sponge an instrument count was correct  DICTATION:  The patient was taken to the operating and placed in the supine position in the operating room table. Pneumatic compression stockings were placed in bilateral lower extremities.The patient was prepped and draped in the usual sterile fashion. A time out was performed per hospital's policy. A pneumoperitoneum was obtained via a Veress needle technique in the right upper quadrant to a pressure of 15 mmHg.  A 50mm trochar was then placed in the right upper quadrant via the same incision. There were no injuries to any abdominal organs. A 11 mm port was then placed in the infraumbilical region after infiltrating with local anesthesia under direct visualization.  An epigastric port and a right lower quadrant port were placed under direct visualization, respectively.   The gallbladder was identified and retracted, the peritoneum was then sharply dissected from the gallbladder and this dissection was carried down to Calot's triangle. The cystic duct was identified and stripped away circumferentially and seen going into the gallbladder 360. The cystic artery was identified and dissected circumferentially and seen going in the gallbladder. The critical view of safety was obtained. Two clips were placed proximally one distally and the cystic duct was transected. The cystic artery was identified and two clips placed proximally  and one distally and transected.We then proceeded to remove the gallbladder off the hepatic fossa with Bovie electrocautery. A retrieval bag (Ecosac) was then placed in the abdomen and the gallbladder was placed in the bag. The hepatic fossa was then reexamined and hemostasis was achieved with Bovie electrocautery.  The gallbladder and retrieval bag were removed from the abdominal cavity. The 11 mm trocar fascia was reapproximated with the Endo Close #1 Vicryl x2. The pneumoperitoneum was evacuated and all trochars removed under direct visulalization. The skin was then closed with 4-0 Monocryl and Dermabond.   The patient was awaken from general anesthesia and taken to the recovery room in stable condition.  PLAN OF CARE: Discharge to home after PACU  PATIENT DISPOSITION:  PACU - hemodynamically stable.   Delay start of Pharmacological VTE agent (>24hrs) due to surgical blood loss or risk of bleeding: not applicable

## 2020-11-15 NOTE — Anesthesia Preprocedure Evaluation (Addendum)
Anesthesia Evaluation  Patient identified by MRN, date of birth, ID band Patient awake    Reviewed: Allergy & Precautions, NPO status , Patient's Chart, lab work & pertinent test results  Airway Mallampati: II  TM Distance: >3 FB Neck ROM: Full    Dental no notable dental hx. (+) Teeth Intact, Dental Advisory Given   Pulmonary neg pulmonary ROS,    Pulmonary exam normal breath sounds clear to auscultation       Cardiovascular negative cardio ROS Normal cardiovascular exam Rhythm:Regular Rate:Normal     Neuro/Psych negative neurological ROS  negative psych ROS   GI/Hepatic negative GI ROS, Neg liver ROS,   Endo/Other  negative endocrine ROS  Renal/GU negative Renal ROS  negative genitourinary   Musculoskeletal negative musculoskeletal ROS (+)   Abdominal   Peds  Hematology negative hematology ROS (+)   Anesthesia Other Findings   Reproductive/Obstetrics                            Anesthesia Physical Anesthesia Plan  ASA: II  Anesthesia Plan: General   Post-op Pain Management:    Induction: Intravenous  PONV Risk Score and Plan: 3 and Midazolam, Dexamethasone and Ondansetron  Airway Management Planned: Oral ETT  Additional Equipment:   Intra-op Plan:   Post-operative Plan: Extubation in OR  Informed Consent: I have reviewed the patients History and Physical, chart, labs and discussed the procedure including the risks, benefits and alternatives for the proposed anesthesia with the patient or authorized representative who has indicated his/her understanding and acceptance.     Dental advisory given  Plan Discussed with: CRNA  Anesthesia Plan Comments:         Anesthesia Quick Evaluation

## 2020-11-15 NOTE — Transfer of Care (Signed)
Immediate Anesthesia Transfer of Care Note  Patient: Catherine Nixon  Procedure(s) Performed: LAPAROSCOPIC CHOLECYSTECTOMY (N/A Abdomen)  Patient Location: PACU  Anesthesia Type:General  Level of Consciousness: awake, alert , oriented, drowsy and patient cooperative  Airway & Oxygen Therapy: Patient Spontanous Breathing and Patient connected to face mask oxygen  Post-op Assessment: Report given to RN, Post -op Vital signs reviewed and stable, Patient moving all extremities and Patient moving all extremities X 4  Post vital signs: Reviewed and stable  Last Vitals:  Vitals Value Taken Time  BP 99/57 11/15/20 1034  Temp 36.3 C 11/15/20 1035  Pulse 77 11/15/20 1039  Resp 14 11/15/20 1039  SpO2 99 % 11/15/20 1039  Vitals shown include unvalidated device data.  Last Pain:  Vitals:   11/15/20 0803  TempSrc: Oral  PainSc:       Patients Stated Pain Goal: 5 (11/15/20 0751)  Complications: No complications documented.

## 2020-11-15 NOTE — Anesthesia Procedure Notes (Signed)
Procedure Name: Intubation Date/Time: 11/15/2020 9:30 AM Performed by: Hendricks Limes, CRNA Pre-anesthesia Checklist: Patient identified, Emergency Drugs available, Suction available and Patient being monitored Patient Re-evaluated:Patient Re-evaluated prior to induction Oxygen Delivery Method: Circle system utilized Preoxygenation: Pre-oxygenation with 100% oxygen Induction Type: IV induction Ventilation: Mask ventilation without difficulty Laryngoscope Size: Mac and 3 Grade View: Grade I Tube type: Oral Tube size: 6.5 mm Number of attempts: 1 Airway Equipment and Method: Stylet Placement Confirmation: ETT inserted through vocal cords under direct vision,  positive ETCO2 and breath sounds checked- equal and bilateral Secured at: 19 cm Tube secured with: Tape Dental Injury: Teeth and Oropharynx as per pre-operative assessment

## 2020-11-15 NOTE — Discharge Instructions (Signed)
[facility] ______[facility] °LAPAROSCOPIC SURGERY: POST OP INSTRUCTIONS °Always review your discharge instruction sheet given to you by the facility where your surgery was performed. °IF YOU HAVE DISABILITY OR FAMILY LEAVE FORMS, YOU MUST BRING THEM TO THE OFFICE FOR PROCESSING.   °DO NOT GIVE THEM TO YOUR DOCTOR. ° °1. A prescription for pain medication may be given to you upon discharge.  Take your pain medication as prescribed, if needed.  If narcotic pain medicine is not needed, then you may take acetaminophen (Tylenol) or ibuprofen (Advil) as needed. °2. Take your usually prescribed medications unless otherwise directed. °3. If you need a refill on your pain medication, please contact your pharmacy.  They will contact our office to request authorization. Prescriptions will not be filled after 5pm or on week-ends. °4. You should follow a light diet the first few days after arrival home, such as soup and crackers, etc.  Be sure to include lots of fluids daily. °5. Most patients will experience some swelling and bruising in the area of the incisions.  Ice packs will help.  Swelling and bruising can take several days to resolve.  °6. It is common to experience some constipation if taking pain medication after surgery.  Increasing fluid intake and taking a stool softener (such as Colace) will usually help or prevent this problem from occurring.  A mild laxative (Milk of Magnesia or Miralax) should be taken according to package instructions if there are no bowel movements after 48 hours. °7. Unless discharge instructions indicate otherwise, you may remove your bandages 24-48 hours after surgery, and you may shower at that time.  You may have steri-strips (small skin tapes) in place directly over the incision.  These strips should be left on the skin for 7-10 days.  If your surgeon used skin glue on the incision, you may shower in 24 hours.  The glue will flake off over the next 2-3 weeks.  Any sutures or  staples will be removed at the office during your follow-up visit. °8. ACTIVITIES:  You may resume regular (light) daily activities beginning the next day--such as daily self-care, walking, climbing stairs--gradually increasing activities as tolerated.  You may have sexual intercourse when it is comfortable.  Refrain from any heavy lifting or straining until approved by your doctor. °a. You may drive when you are no longer taking prescription pain medication, you can comfortably wear a seatbelt, and you can safely maneuver your car and apply brakes. °b. RETURN TO WORK:  __________________________________________________________ °9. You should see your doctor in the office for a follow-up appointment approximately 2-3 weeks after your surgery.  Make sure that you call for this appointment within a day or two after you arrive home to insure a convenient appointment time. °10. OTHER INSTRUCTIONS: __________________________________________________________________________________________________________________________ __________________________________________________________________________________________________________________________ °WHEN TO CALL YOUR DOCTOR: °1. Fever over 101.0 °2. Inability to urinate °3. Continued bleeding from incision. °4. Increased pain, redness, or drainage from the incision. °5. Increasing abdominal pain ° °The clinic staff is available to answer your questions during regular business hours.  Please don’t hesitate to call and ask to speak to one of the nurses for clinical concerns.  If you have a medical emergency, go to the nearest emergency room or call 911.  A surgeon from [facility] is always on call at the hospital. °[address], [address], [place], [place]  [address] ? [address] [address], [place], [place]   [address] °[phone] ? [phone] ? FAX [phone] °Web site:   [email or web address] °

## 2020-11-16 ENCOUNTER — Encounter (HOSPITAL_COMMUNITY): Payer: Self-pay | Admitting: General Surgery

## 2020-11-16 LAB — SURGICAL PATHOLOGY

## 2020-11-16 NOTE — Anesthesia Postprocedure Evaluation (Signed)
Anesthesia Post Note  Patient: Catherine Nixon  Procedure(s) Performed: LAPAROSCOPIC CHOLECYSTECTOMY (N/A Abdomen)     Patient location during evaluation: PACU Anesthesia Type: General Level of consciousness: awake and alert Pain management: pain level controlled Vital Signs Assessment: post-procedure vital signs reviewed and stable Respiratory status: spontaneous breathing, nonlabored ventilation, respiratory function stable and patient connected to nasal cannula oxygen Cardiovascular status: blood pressure returned to baseline and stable Postop Assessment: no apparent nausea or vomiting Anesthetic complications: no   No complications documented.  Last Vitals:  Vitals:   11/15/20 1105 11/15/20 1119  BP: (!) 112/57 110/76  Pulse: 68 75  Resp: 20 16  Temp:    SpO2: 100% 100%    Last Pain:  Vitals:   11/15/20 1119  TempSrc:   PainSc: Asleep                 Nalee Lightle L Lauralei Clouse

## 2020-12-30 ENCOUNTER — Other Ambulatory Visit: Payer: Self-pay

## 2020-12-30 ENCOUNTER — Encounter (HOSPITAL_COMMUNITY): Payer: Self-pay | Admitting: *Deleted

## 2020-12-30 ENCOUNTER — Ambulatory Visit (HOSPITAL_COMMUNITY)
Admission: EM | Admit: 2020-12-30 | Discharge: 2020-12-30 | Disposition: A | Discharge status: Home / Self Care | Payer: Medicaid Other | Attending: Family Medicine | Admitting: Family Medicine

## 2020-12-30 DIAGNOSIS — Z79899 Other long term (current) drug therapy: Secondary | ICD-10-CM | POA: Diagnosis not present

## 2020-12-30 DIAGNOSIS — J029 Acute pharyngitis, unspecified: Secondary | ICD-10-CM | POA: Diagnosis not present

## 2020-12-30 DIAGNOSIS — Z20822 Contact with and (suspected) exposure to covid-19: Secondary | ICD-10-CM | POA: Diagnosis not present

## 2020-12-30 DIAGNOSIS — J069 Acute upper respiratory infection, unspecified: Secondary | ICD-10-CM

## 2020-12-30 MED ORDER — PROMETHAZINE-DM 6.25-15 MG/5ML PO SYRP: 5.0000 mL | ORAL_SOLUTION | Freq: Four times a day (QID) | ORAL | 0 refills | Status: DC | PRN

## 2020-12-30 NOTE — ED Provider Notes (Signed)
[facility]    CSN: [id number] Arrival date & time: 12/30/20  1154      History   Chief Complaint Chief Complaint  Patient presents with  . Cough  . Fever  . Sore Throat    HPI Catherine Nixon is a 20 y.o. female.   Medical interpreter used today to facilitate visit patient's consent.  She has had 2 days of fever, sore throat, dry cough.  She denies body aches, chest pain, shortness of breath, abdominal pain, nausea vomiting diarrhea.  Has been taking Tylenol and Motrin with only mild temporary relief.  Denies any sick contacts, chronic medical problems.     Past Medical History:  Diagnosis Date  . Anemia    hx of  . Medical history non-contributory     Patient Active Problem List   Diagnosis Date Noted  . Abdominal pain, epigastric 09/15/2020  . Nausea with vomiting 09/15/2020  . Vaginal delivery 10/13/2017  . Post term pregnancy at [redacted] weeks gestation 10/12/2017    Past Surgical History:  Procedure Laterality Date  . CHOLECYSTECTOMY N/A 11/15/2020   Procedure: LAPAROSCOPIC CHOLECYSTECTOMY;  Surgeon: Axel Filler, MD;  Location: [facility] OR;  Service: General;  Laterality: N/A;  . NO PAST SURGERIES      OB History    Gravida  1   Para  1   Term  1   Preterm      AB      Living  1     SAB      IAB      Ectopic      Multiple  0   Live Births  1            Home Medications    Prior to Admission medications   Medication Sig Start Date End Date Taking? Authorizing Provider  promethazine-dextromethorphan (PROMETHAZINE-DM) 6.25-15 MG/5ML syrup Take 5 mLs by mouth 4 (four) times daily as needed for cough. 12/30/20  Yes Particia Nearing, PA-C  ondansetron (ZOFRAN ODT) 4 MG disintegrating tablet Take 1 tablet (4 mg total) by mouth every 8 (eight) hours as needed for nausea. 10/29/20   Garlon Hatchet, PA-C  oxyCODONE-acetaminophen (PERCOCET) 5-325 MG tablet Take 1 tablet by mouth every 4 (four) hours as needed. 10/29/20   Garlon Hatchet, PA-C  traMADol (ULTRAM) 50 MG tablet Take 1 tablet (50 mg total) by mouth every 6 (six) hours as needed. 11/15/20 11/15/21  Axel Filler, MD    Family History Family History  Problem Relation Age of Onset  . Colon cancer Mother   . Diabetes Father   . Asthma Neg Hx   . Heart disease Neg Hx   . Hypertension Neg Hx   . Stroke Neg Hx   . Esophageal cancer Neg Hx   . Pancreatic cancer Neg Hx   . Liver disease Neg Hx     Social History Social History   Tobacco Use  . Smoking status: Never Smoker  . Smokeless tobacco: Never Used  Vaping Use  . Vaping Use: Never used  Substance Use Topics  . Alcohol use: No  . Drug use: No     Allergies   Patient has no known allergies.   Review of Systems Review of Systems Per HPI  Physical Exam Triage Vital Signs ED Triage Vitals  Enc Vitals Group     BP 12/30/20 1207 110/68     Pulse Rate 12/30/20 1207 100     Resp 12/30/20 1207 19  Temp 12/30/20 1207 99.2 F (37.3 C)     Temp Source 12/30/20 1207 Oral     SpO2 12/30/20 1207 99 %     Weight --      Height --      Head Circumference --      Peak Flow --      Pain Score 12/30/20 1208 5     Pain Loc --      Pain Edu? --      Excl. in GC? --    No data found.  Updated Vital Signs BP 110/68 (BP Location: Right Arm)   Pulse 100   Temp 99.2 F (37.3 C) (Oral)   Resp 19   LMP 12/12/2020   SpO2 99%   Visual Acuity Right Eye Distance:   Left Eye Distance:   Bilateral Distance:    Right Eye Near:   Left Eye Near:    Bilateral Near:     Physical Exam Vitals and nursing note reviewed.  Constitutional:      Appearance: Normal appearance. She is not ill-appearing.  HENT:     Head: Atraumatic.     Right Ear: Tympanic membrane normal.     Left Ear: Tympanic membrane normal.     Nose: Rhinorrhea present.     Mouth/Throat:     Mouth: Mucous membranes are moist.     Pharynx: Oropharynx is clear. Posterior oropharyngeal erythema present. No oropharyngeal  exudate.  Eyes:     Extraocular Movements: Extraocular movements intact.     Conjunctiva/sclera: Conjunctivae normal.  Cardiovascular:     Rate and Rhythm: Normal rate and regular rhythm.     Heart sounds: Normal heart sounds.  Pulmonary:     Effort: Pulmonary effort is normal. No respiratory distress.     Breath sounds: Normal breath sounds. No wheezing or rales.  Abdominal:     General: Bowel sounds are normal. There is no distension.     Palpations: Abdomen is soft.     Tenderness: There is no abdominal tenderness. There is no guarding.  Musculoskeletal:        General: Normal range of motion.     Cervical back: Normal range of motion and neck supple.  Skin:    General: Skin is warm and dry.  Neurological:     Mental Status: She is alert and oriented to person, place, and time.  Psychiatric:        Mood and Affect: Mood normal.        Thought Content: Thought content normal.        Judgment: Judgment normal.      UC Treatments / Results  Labs (all labs ordered are listed, but only abnormal results are displayed) Labs Reviewed  SARS CORONAVIRUS 2 (TAT 6-24 HRS)    EKG   Radiology No results found.  Procedures Procedures (including critical care time)  Medications Ordered in UC Medications - No data to display  Initial Impression / Assessment and Plan / UC Course  I have reviewed the triage vital signs and the nursing notes.  Pertinent labs & imaging results that were available during my care of the patient were reviewed by me and considered in my medical decision making (see chart for details).     Exam and vitals reassuring today, Covid PCR pending.  Work note given while awaiting results.  Discussed over-the-counter remedies, supportive care and will give Phenergan DM cough syrup for cough suppression.  Return for acutely worsening symptoms.  Final Clinical  Impressions(s) / UC Diagnoses   Final diagnoses:  Viral URI with cough   Discharge Instructions    None    ED Prescriptions    Medication Sig Dispense Auth. Provider   promethazine-dextromethorphan (PROMETHAZINE-DM) 6.25-15 MG/5ML syrup Take 5 mLs by mouth 4 (four) times daily as needed for cough. 100 mL Particia Nearing, [place]     PDMP not reviewed this encounter.   Particia Nearing, [place] 12/30/20 1357

## 2020-12-30 NOTE — ED Triage Notes (Signed)
Pt reports fever 102 at home 99.4 today. Pt also has productive cough and sore throat.

## 2020-12-31 LAB — SARS CORONAVIRUS 2 (TAT 6-24 HRS): SARS Coronavirus 2: NEGATIVE

## 2021-01-04 ENCOUNTER — Other Ambulatory Visit: Payer: Self-pay

## 2021-01-04 ENCOUNTER — Encounter (INDEPENDENT_AMBULATORY_CARE_PROVIDER_SITE_OTHER): Payer: Self-pay | Admitting: Primary Care

## 2021-01-04 ENCOUNTER — Ambulatory Visit (INDEPENDENT_AMBULATORY_CARE_PROVIDER_SITE_OTHER): Payer: Medicaid Other | Admitting: Primary Care

## 2021-01-04 VITALS — BP 107/72 | HR 82 | Resp 16 | Ht 59.0 in | Wt 123.0 lb

## 2021-01-04 DIAGNOSIS — R519 Headache, unspecified: Secondary | ICD-10-CM | POA: Diagnosis not present

## 2021-01-04 DIAGNOSIS — Z09 Encounter for follow-up examination after completed treatment for conditions other than malignant neoplasm: Secondary | ICD-10-CM

## 2021-01-04 DIAGNOSIS — D649 Anemia, unspecified: Secondary | ICD-10-CM | POA: Diagnosis not present

## 2021-01-04 DIAGNOSIS — G8929 Other chronic pain: Secondary | ICD-10-CM

## 2021-01-04 NOTE — Progress Notes (Signed)
Established Patient Office Visit  Subjective:  Patient ID: Catherine Nixon, female    DOB: 2000-11-06  Age: 20 y.o. MRN: [id number]  CC:  Chief Complaint  Patient presents with  . Hospitalization Follow-up    HPI [name] is a 54 Hispanic female Catherine Nixon [phone] interpretor )  presents for ED follow up for cough , fever and sore throat.  COVID testing- results were negative but stated she was here for anemia.    Past Medical History:  Diagnosis Date  . Anemia    hx of  . Medical history non-contributory     Past Surgical History:  Procedure Laterality Date  . CHOLECYSTECTOMY N/A 11/15/2020   Procedure: LAPAROSCOPIC CHOLECYSTECTOMY;  Surgeon: Axel Filler, MD;  Location: [facility] OR;  Service: General;  Laterality: N/A;  . NO PAST SURGERIES      Family History  Problem Relation Age of Onset  . Colon cancer Mother   . Diabetes Father   . Asthma Neg Hx   . Heart disease Neg Hx   . Hypertension Neg Hx   . Stroke Neg Hx   . Esophageal cancer Neg Hx   . Pancreatic cancer Neg Hx   . Liver disease Neg Hx     Social History   Socioeconomic History  . Marital status: Single    Spouse name: Not on file  . Number of children: Not on file  . Years of education: Not on file  . Highest education level: Not on file  Occupational History  . Not on file  Tobacco Use  . Smoking status: Never Smoker  . Smokeless tobacco: Never Used  Vaping Use  . Vaping Use: Never used  Substance and Sexual Activity  . Alcohol use: No  . Drug use: No  . Sexual activity: Never    Birth control/protection: Abstinence    Comment: FOB in [place]   Other Topics Concern  . Not on file  Social History Narrative  . Not on file   Social Determinants of Health   Financial Resource Strain: Not on file  Food Insecurity: Not on file  Transportation Needs: Not on file  Physical Activity: Not on file  Stress: Not on file  Social Connections: Not on file  Intimate Partner Violence: Not on  file    Outpatient Medications Prior to Visit  Medication Sig Dispense Refill  . ondansetron (ZOFRAN ODT) 4 MG disintegrating tablet Take 1 tablet (4 mg total) by mouth every 8 (eight) hours as needed for nausea. (Patient not taking: Reported on 01/04/2021) 10 tablet 0  . oxyCODONE-acetaminophen (PERCOCET) 5-325 MG tablet Take 1 tablet by mouth every 4 (four) hours as needed. (Patient not taking: Reported on 01/04/2021) 20 tablet 0  . promethazine-dextromethorphan (PROMETHAZINE-DM) 6.25-15 MG/5ML syrup Take 5 mLs by mouth 4 (four) times daily as needed for cough. (Patient not taking: Reported on 01/04/2021) 100 mL 0  . traMADol (ULTRAM) 50 MG tablet Take 1 tablet (50 mg total) by mouth every 6 (six) hours as needed. (Patient not taking: Reported on 01/04/2021) 20 tablet 0   No facility-administered medications prior to visit.    No Known Allergies  ROS Review of Systems  Neurological: Positive for headaches.       Small palpitations entire month tension h/a sometimes in the mornings  4/10   All other systems reviewed and are negative.     Objective:    Physical Exam Vitals reviewed.  Constitutional:      Appearance: Normal appearance.  HENT:     Head: Normocephalic.     Right Ear: Tympanic membrane and external ear normal.     Left Ear: Tympanic membrane and external ear normal.     Nose: Nose normal.  Eyes:     Extraocular Movements: Extraocular movements intact.  Cardiovascular:     Rate and Rhythm: Normal rate and regular rhythm.  Pulmonary:     Effort: Pulmonary effort is normal.     Breath sounds: Normal breath sounds.  Abdominal:     General: Abdomen is flat. Bowel sounds are normal.     Palpations: Abdomen is soft.  Musculoskeletal:        General: Normal range of motion.     Cervical back: Normal range of motion.  Skin:    General: Skin is warm and dry.  Neurological:     Mental Status: She is alert and oriented to person, place, and time.  Psychiatric:         Mood and Affect: Mood normal.        Behavior: Behavior normal.        Thought Content: Thought content normal.        Judgment: Judgment normal.     BP 107/72   Pulse 82   Resp 16   Ht 4\' 11"  (1.499 m)   Wt 123 lb (55.8 kg)   LMP 12/12/2020   SpO2 98%   BMI 24.84 kg/m  Wt Readings from Last 3 Encounters:  01/04/21 123 lb (55.8 kg)  11/15/20 120 lb 12.8 oz (54.8 kg)  09/20/20 122 lb (55.3 kg)     Health Maintenance Due  Topic Date Due  . COVID-19 Vaccine (1) Never done  . HPV VACCINES (1 - 2-dose series) Never done       Topic Date Due  . HPV VACCINES (1 - 2-dose series) Never done    No results found for: TSH Lab Results  Component Value Date   WBC 7.0 11/15/2020   HGB 13.4 11/15/2020   HCT 42.7 11/15/2020   MCV 91.4 11/15/2020   PLT 246 11/15/2020   Lab Results  Component Value Date   NA 136 10/28/2020   K 3.5 10/28/2020   CO2 22 10/28/2020   GLUCOSE 106 (H) 10/28/2020   BUN 18 10/28/2020   CREATININE 0.59 10/28/2020   BILITOT 0.5 10/28/2020   ALKPHOS 81 10/28/2020   AST 17 10/28/2020   ALT 16 10/28/2020   PROT 7.0 10/28/2020   ALBUMIN 4.0 10/28/2020   CALCIUM 8.8 (L) 10/28/2020   ANIONGAP 10 10/28/2020   No results found for: CHOL No results found for: HDL No results found for: LDLCALC No results found for: TRIG No results found for: CHOLHDL No results found for: 10/30/2020    Assessment & Plan:  Pleshette was seen today for hospitalization follow-up.  Diagnoses and all orders for this visit:  Hospital discharge follow-up Viral infection negative for COVID  Anemia, unspecified type  last CBC was wnl Hemoglobin low 3 years ago. She is unsure why she being seen for anemia   Chronic nonintractable headache, unspecified headache type Unable to associated causes related to headaches discussed increase caffeine- coffee, soda, and track menstrual cycle if headaches occurs near or cycle of menstrual cycle. Headaches has made her tired and released  with sleep.   if worsening HA, changes vision/speech, imbalance, weakness go to the ER    Follow-up: Return if symptoms worsen or fail to improve, for Call to schedule appt if headaches  continue .  Reviewed ER notes and labs    [name], NP

## 2021-01-04 NOTE — Patient Instructions (Addendum)
Debbe Odea Dolor de cabeza general sin causa General Headache Without Cause El dolor de cabeza es un dolor o malestar que se siente en la zona de la cabeza o del cuello. Hay muchas causas y tipos de dolores de [place]. En algunos casos, es posible que no se encuentre la causa. Siga estas indicaciones en su casa: Controle su afeccin para detectar cualquier cambio. Infrmele a su mdico acerca de los cambios. Siga estos pasos para Facilities manager afeccin: Control del [employer] medicamentos de venta libre y los recetados solamente como se lo haya indicado el mdico.  Cuando sienta dolor de cabeza acustese en un cuarto oscuro y tranquilo.  Si se lo indican, aplquese hielo en la cabeza y en la zona del cuello: ? Ponga el hielo en una bolsa plstica. ? Coloque una [employer] piel y Copy. ? Coloque el hielo durante , 2a3veces al da.  Si se lo indican, aplique calor en la zona afectada. Use la fuente de calor que el mdico le recomiende, como una compresa de calor hmedo o una almohadilla trmica. ? Coloque una [employer] piel y la fuente de Airline pilot. ? Aplique calor durante 20 a . ? Retire la fuente de calor si la piel se pone de color rojo brillante. Esto es muy importante si no puede Financial risk analyst, calor o fro. Puede correr un riesgo mayor de sufrir quemaduras.  Mantenga las luces tenues si las luces brillantes le molestan o sus dolores de cabeza [place].      Comida y bebida  Mantenga un horario para las comidas.  Si bebe alcohol: ? Limite la cantidad que bebe a lo siguiente:  De 0 a 1 medida por da para las mujeres.  De 0 a 2 medidas por da para los hombres. ? Est atento a la cantidad de alcohol que hay en las bebidas que toma. En los [place], una medida equivale a una botella de cerveza de 12oz ( ), un vaso de vino de 5oz ( ) o un vaso de una bebida alcohlica de alta graduacin de 1oz (8ml).  Deje de tomar cafena o  reduzca la cantidad que consume. Indicaciones generales  Lleve un registro diario para averiguar si ciertas cosas provocan los dolores de [place]. Registre, por ejemplo, lo siguiente: ? Lo que usted come y bebe. ? El tiempo que duerme. ? Algn cambio en su dieta o en los medicamentos.  Hgase masajes o pruebe otras formas de relajarse.  Limite el estrs.  Sintese con la espalda recta. No contraiga (tensione) los msculos.  No consuma ningn producto que contenga nicotina o tabaco. Estos incluyen los cigarrillos, el tabaco para Theatre manager y los Administrator, Civil Service. Si necesita ayuda para dejar de fumar, consulte al mdico.  Haga ejercicios con regularidad tal como se lo indic el mdico.  Duerma lo suficiente. Esto a menudo significa entre 7 y 9horas de sueo cada noche.  Concurra a todas las visitas de control como se lo haya indicado el mdico. Esto es importante.   Comunquese con un mdico si:  Los medicamentos no logran [employer].  Tiene un dolor de cabeza que es diferente a los otros dolores de [place].  Tiene malestar estomacal (nuseas) o vomita.  Tiene fiebre. Solicite ayuda inmediatamente si:  El dolor de [place] empeora rpidamente.  El dolor empeora despus de hacer mucha actividad fsica.  Sigue vomitando.  Presenta rigidez en el cuello.  Tiene dificultad para ver.  Tiene dificultad para hablar.  Siente dolor  en el ojo o en el odo.  Sus msculos estn dbiles, o pierde el control muscular.  Pierde el equilibrio o tiene problemas para Advertising account planner.  Siente que va a desvanecerse (perder el conocimiento) o se desmaya.  Est desorientado (confundido).  Tiene una convulsin. Resumen  El dolor de cabeza es un dolor o Dentist que se siente en la zona de la cabeza o del cuello.  Hay muchas causas y tipos de dolores de [place]. En algunos casos, es posible que no se encuentre la causa.  Lleve un diario como ayuda para [employer] causa de los  dolores de [place]. Controle su afeccin para Insurance risk surveyor cambio. Infrmele a su mdico acerca de los cambios.  Comunquese con un mdico si tiene un dolor de cabeza que es diferente de lo habitual o si el dolor de cabeza no se alivia con los medicamentos.  Solicite ayuda de inmediato si el dolor de cabeza es muy intenso, vomita, tiene dificultad para ver, pierde el equilibrio o tiene una convulsin. Esta informacin no tiene Theme park manager el consejo del mdico. Asegrese de hacerle al mdico cualquier pregunta que tenga. Document Revised: 05/28/2018 Document Reviewed: 05/28/2018 Elsevier Patient Education  2021 [employer].

## 2021-01-05 ENCOUNTER — Telehealth (INDEPENDENT_AMBULATORY_CARE_PROVIDER_SITE_OTHER): Payer: Self-pay

## 2021-01-05 NOTE — Telephone Encounter (Signed)
Copied from CRM [id number]. Topic: General - Call Back - No Documentation >> Jan 04, 2021  4:53 PM Randol Kern wrote: Reason for CRM: Pt needs a doctor's note covering today's appt because of anemia Please upload via mychart Best contact: [phone]

## 2021-02-02 ENCOUNTER — Ambulatory Visit (INDEPENDENT_AMBULATORY_CARE_PROVIDER_SITE_OTHER): Payer: Medicaid Other | Admitting: Primary Care

## 2021-02-02 ENCOUNTER — Encounter (INDEPENDENT_AMBULATORY_CARE_PROVIDER_SITE_OTHER): Payer: Self-pay | Admitting: Primary Care

## 2021-02-02 ENCOUNTER — Encounter: Payer: Self-pay | Admitting: Neurology

## 2021-02-02 ENCOUNTER — Other Ambulatory Visit: Payer: Self-pay

## 2021-02-02 VITALS — BP 106/69 | HR 86 | Temp 97.5°F | Ht 59.0 in | Wt 124.0 lb

## 2021-02-02 DIAGNOSIS — J301 Allergic rhinitis due to pollen: Secondary | ICD-10-CM

## 2021-02-02 DIAGNOSIS — G8929 Other chronic pain: Secondary | ICD-10-CM | POA: Diagnosis not present

## 2021-02-02 DIAGNOSIS — R519 Headache, unspecified: Secondary | ICD-10-CM

## 2021-02-02 MED ORDER — NAPROXEN 500 MG PO TABS: 500.0000 mg | ORAL_TABLET | Freq: Two times a day (BID) | ORAL | 1 refills | Status: DC

## 2021-02-02 MED ORDER — FEXOFENADINE-PSEUDOEPHED ER 60-120 MG PO TB12: 1.0000 | ORAL_TABLET | Freq: Two times a day (BID) | ORAL | 1 refills | Status: DC

## 2021-02-02 MED ORDER — FLUTICASONE PROPIONATE 50 MCG/ACT NA SUSP: 2.0000 | Freq: Every day | NASAL | 6 refills | Status: DC

## 2021-02-02 NOTE — Progress Notes (Signed)
Throbbing on right side of head 4/5 times per day

## 2021-02-02 NOTE — Progress Notes (Signed)
SUBJECTIVE: [name] is a 20 y.o. Hispanic female Catherine Nixon [phone])  who complains of headaches for . Description of pain:Throbbing on right side of head 4/5 times per day  Duration of individual headaches: 15 minute(s), frequency several times per week. Associated symptoms: facial pain ( throbbing right side frontal area). Pain relief: ibuprofen (caused drowsiness) but headache remained. Precipitating factors: patient is aware of none. She denies a history of recent head injury.  Prior neurological history: negative for no neurological problems. Neurologic Review of Systems - headaches described as throbbing on right side frontal area.  Past Medical History:  Diagnosis Date  . Anemia    hx of  . Medical history non-contributory    Current Outpatient Medications on File Prior to Visit  Medication Sig Dispense Refill  . ondansetron (ZOFRAN ODT) 4 MG disintegrating tablet Take 1 tablet (4 mg total) by mouth every 8 (eight) hours as needed for nausea. (Patient not taking: Reported on 01/04/2021) 10 tablet 0  . oxyCODONE-acetaminophen (PERCOCET) 5-325 MG tablet Take 1 tablet by mouth every 4 (four) hours as needed. (Patient not taking: Reported on 01/04/2021) 20 tablet 0  . promethazine-dextromethorphan (PROMETHAZINE-DM) 6.25-15 MG/5ML syrup Take 5 mLs by mouth 4 (four) times daily as needed for cough. (Patient not taking: Reported on 01/04/2021) 100 mL 0  . traMADol (ULTRAM) 50 MG tablet Take 1 tablet (50 mg total) by mouth every 6 (six) hours as needed. (Patient not taking: Reported on 01/04/2021) 20 tablet 0   No current facility-administered medications on file prior to visit.    OBJECTIVE:  Appearance: alert, well appearing, and in mild distress. Neurological Exam: alert, oriented, normal speech, no focal findings or movement disorder noted. Review of Systems  HENT: Positive for congestion.   Eyes: Positive for discharge and redness.       Watery  Respiratory: Positive for cough.    Neurological: Positive for headaches.  All other systems reviewed and are negative.   ASSESSMENT: Catherine Nixon was seen today for headache.  Diagnoses and all orders for this visit:  seasonal allergic rhinitis due to pollen -     fexofenadine-pseudoephedrine (ALLEGRA-D) 60-120 MG 12 hr tablet; Take 1 tablet by mouth 2 (two) times daily. -     fluticasone (FLONASE) 50 MCG/ACT nasal spray; Place 2 sprays into both nostrils daily.  Chronic nonintractable headache, unspecified headache type -     naproxen (NAPROSYN) 500 MG tablet; Take 1 tablet (500 mg total) by mouth 2 (two) times daily with a meal. -     Ambulatory referral to Neurology Recommendations: lie in darkened room and apply cold packs prn for pain, side effect profile discussed in detail, asked to keep headache diary and referral to Neurology. See orders for this visit as documented in the electronic medical record.  HEADACHE  Headache started  days ago Pain is described as throbbing  Severity:  6/10 Location: right frontal  Sudden onset: gradually Duration: up to 15 mins Previous similar headaches: yes Medications/ therapies tried:ibuprofen  Taking blood thinners: NO History of cancer: No Head trauma: No Nasal congestion:  yes Lacrimation/ rhinorrhea: yes Nausea/ vomiting: no Photophobia: no Phonophobia: no Blurry vision/ double vision/ loss of vision: yes  Uses corrective lenses:no  Fever: no Neck stiffness: no Fatigue: yes Trouble walking or speaking: no  Patient thinks cause of headache might [id number] etiology   Catherine Nixon

## 2021-02-02 NOTE — Patient Instructions (Signed)
Dolor de cabeza general sin causa General Headache Without Cause El dolor de cabeza es un dolor o malestar que se siente en la zona de la cabeza o del cuello. Hay muchas causas y tipos de dolores de cabeza. En algunos casos, es posible que no se encuentre la causa. Siga estas indicaciones en su casa: Controle su afeccin para detectar cualquier cambio. Infrmele a su mdico acerca de los cambios. Siga estos pasos para controlar su afeccin: Control del dolor Tome los medicamentos de venta libre y los recetados solamente como se lo haya indicado el mdico. Cuando sienta dolor de cabeza acustese en un cuarto oscuro y tranquilo. Si se lo indican, aplquese hielo en la cabeza y en la zona del cuello: Ponga el hielo en una bolsa plstica. Coloque una toalla entre la piel y la bolsa. Coloque el hielo durante 20 minutos, 2 a 3 veces al da. Si se lo indican, aplique calor en la zona afectada. Use la fuente de calor que el mdico le recomiende, como una compresa de calor hmedo o una almohadilla trmica. Coloque una toalla entre la piel y la fuente de calor. Aplique calor durante 20 a 30 minutos. Retire la fuente de calor si la piel se pone de color rojo brillante. Esto es muy importante si no puede sentir dolor, calor o fro. Puede correr un riesgo mayor de sufrir quemaduras. Mantenga las luces tenues si las luces brillantes le molestan o sus dolores de cabeza empeoran.      Comida y bebida Mantenga un horario para las comidas. Si bebe alcohol: Limite la cantidad que bebe a lo siguiente: De 0 a 1 medida por da para las mujeres. De 0 a 2 medidas por da para los hombres. Est atento a la cantidad de alcohol que hay en las bebidas que toma. En los [place], una medida equivale a una botella de cerveza de 12 oz (355 ml), un vaso de vino de 5 oz (148 ml) o un vaso de una bebida alcohlica de alta graduacin de 1 oz (44 ml). Deje de tomar cafena o reduzca la cantidad que  consume. Indicaciones generales Lleve un registro diario para averiguar si ciertas cosas provocan los dolores de cabeza. Registre, por ejemplo, lo siguiente: Lo que usted come y bebe. El tiempo que duerme. Algn cambio en su dieta o en los medicamentos. Hgase masajes o pruebe otras formas de relajarse. Limite el estrs. Sintese con la espalda recta. No contraiga (tensione) los msculos. No consuma ningn producto que contenga nicotina o tabaco. Estos incluyen los cigarrillos, el tabaco para mascar y los cigarrillos electrnicos. Si necesita ayuda para dejar de fumar, consulte al mdico. Haga ejercicios con regularidad tal como se lo indic el mdico. Duerma lo suficiente. Esto a menudo significa entre 7 y 9 horas de sueo cada noche. Concurra a todas las visitas de control como se lo haya indicado el mdico. Esto es importante.   Comunquese con un mdico si: Los medicamentos no logran aliviar los sntomas. Tiene un dolor de cabeza que es diferente a los otros dolores de cabeza. Tiene malestar estomacal (nuseas) o vomita. Tiene fiebre. Solicite ayuda inmediatamente si: El dolor de cabeza empeora rpidamente. El dolor empeora despus de hacer mucha actividad fsica. Sigue vomitando. Presenta rigidez en el cuello. Tiene dificultad para ver. Tiene dificultad para hablar. Siente dolor en el ojo o en el odo. Sus msculos estn dbiles, o pierde el control muscular. Pierde el equilibrio o tiene problemas para caminar. Siente que va a desvanecerse (  perder el conocimiento) o se desmaya. Est desorientado (confundido). Tiene una convulsin. Resumen El dolor de cabeza es un dolor o malestar que se siente en la zona de la cabeza o del cuello. Hay muchas causas y tipos de dolores de cabeza. En algunos casos, es posible que no se encuentre la causa. Lleve un diario como ayuda para determinar la causa de los dolores de cabeza. Controle su afeccin para detectar cualquier cambio. Infrmele a  su mdico acerca de los cambios. Comunquese con un mdico si tiene un dolor de cabeza que es diferente de lo habitual o si el dolor de cabeza no se alivia con los medicamentos. Solicite ayuda de inmediato si el dolor de cabeza es muy intenso, vomita, tiene dificultad para ver, pierde el equilibrio o tiene una convulsin. Esta informacin no tiene como fin reemplazar el consejo del mdico. Asegrese de hacerle al mdico cualquier pregunta que tenga. Document Revised: 05/28/2018 Document Reviewed: 05/28/2018 Elsevier Patient Education  2021 Elsevier Inc.  

## 2021-02-22 ENCOUNTER — Telehealth: Payer: Self-pay | Admitting: Primary Care

## 2021-02-22 NOTE — Telephone Encounter (Signed)
Copied from CRM [id number]. Topic: General - Other >> Feb 22, 2021 12:15 PM Pawlus, Catherine Nixon wrote: Reason for CRM: Pt asked if she would need Nixon referral to get Nixon birth control implant, please advise if the pt needs to see an OBGYN  Called patient and inquired if she currently sees Nixon gynecologist. Patient stated she does not but the only birth control she is interested in is the implant. Patient would like Nixon referral placed to gynecology to discuss implant. Please advise.

## 2021-02-23 ENCOUNTER — Other Ambulatory Visit (INDEPENDENT_AMBULATORY_CARE_PROVIDER_SITE_OTHER): Payer: Self-pay | Admitting: Primary Care

## 2021-02-23 DIAGNOSIS — Z309 Encounter for contraceptive management, unspecified: Secondary | ICD-10-CM

## 2021-02-23 NOTE — Telephone Encounter (Signed)
Patient has been made aware.

## 2021-02-23 NOTE — Telephone Encounter (Signed)
Will you call and let this patient know the referral has been placed but to refrain from sex 2 weeks before appointment.

## 2021-03-20 ENCOUNTER — Other Ambulatory Visit: Payer: Self-pay

## 2021-03-20 ENCOUNTER — Encounter (HOSPITAL_COMMUNITY): Payer: Self-pay

## 2021-03-20 ENCOUNTER — Ambulatory Visit (HOSPITAL_COMMUNITY)
Admission: EM | Admit: 2021-03-20 | Discharge: 2021-03-20 | Disposition: A | Discharge status: Home / Self Care | Payer: Medicaid Other | Attending: Nurse Practitioner | Admitting: Nurse Practitioner

## 2021-03-20 ENCOUNTER — Ambulatory Visit (INDEPENDENT_AMBULATORY_CARE_PROVIDER_SITE_OTHER): Payer: Medicaid Other | Admitting: Primary Care

## 2021-03-20 DIAGNOSIS — N898 Other specified noninflammatory disorders of vagina: Secondary | ICD-10-CM

## 2021-03-20 DIAGNOSIS — Z3A01 Less than 8 weeks gestation of pregnancy: Secondary | ICD-10-CM | POA: Insufficient documentation

## 2021-03-20 DIAGNOSIS — O2691 Pregnancy related conditions, unspecified, first trimester: Secondary | ICD-10-CM | POA: Diagnosis not present

## 2021-03-20 DIAGNOSIS — Z1152 Encounter for screening for COVID-19: Secondary | ICD-10-CM

## 2021-03-20 DIAGNOSIS — R509 Fever, unspecified: Secondary | ICD-10-CM | POA: Diagnosis not present

## 2021-03-20 DIAGNOSIS — O26891 Other specified pregnancy related conditions, first trimester: Secondary | ICD-10-CM | POA: Diagnosis not present

## 2021-03-20 DIAGNOSIS — Z20822 Contact with and (suspected) exposure to covid-19: Secondary | ICD-10-CM | POA: Insufficient documentation

## 2021-03-20 LAB — POCT URINALYSIS DIPSTICK, ED / UC
Bilirubin Urine: NEGATIVE
Glucose, UA: NEGATIVE mg/dL
Hgb urine dipstick: NEGATIVE
Ketones, ur: NEGATIVE mg/dL
Nitrite: NEGATIVE
Protein, ur: NEGATIVE mg/dL
Specific Gravity, Urine: >=1.03 (ref 1.005–1.030)
Urobilinogen, UA: 0.2 mg/dL (ref 0.0–1.0)
pH: 7 (ref 5.0–8.0)

## 2021-03-20 LAB — POC URINE PREG, ED: Preg Test, Ur: POSITIVE — AB

## 2021-03-20 LAB — SARS CORONAVIRUS 2 (TAT 6-24 HRS): SARS Coronavirus 2: NEGATIVE

## 2021-03-20 MED ORDER — ONDANSETRON 8 MG PO TBDP: 8.0000 mg | ORAL_TABLET | Freq: Three times a day (TID) | ORAL | 0 refills | Status: DC | PRN

## 2021-03-20 MED ORDER — PRENATAL COMPLETE 14-0.4 MG PO TABS: 1.0000 | ORAL_TABLET | Freq: Every day | ORAL | 0 refills | Status: DC

## 2021-03-20 NOTE — ED Provider Notes (Signed)
[facility]    CSN: [id number] Arrival date & time: 03/20/21  1049      History   Chief Complaint Chief Complaint  Patient presents with   Back Pain   Abdominal Pain   Nausea    HPI Elwin Mochalma Donu-Perez is a 20 y.o. female.    Spanish interpretation with this encounter; Aarel #[id number]  Subjective:   Elwin Mochalma Donu-Perez is a 20 y.o. female who presents for evaluation of fever, nausea, headache and lower abdominal pain. The pain is described as aching and is 5/10 in intensity. Pain is located in the suprapubic region without radiation. Onset was 2 weeks ago. Symptoms have been stable since. Aggravating factors: none.  Alleviating factors: none. Associated symptoms include anorexia, diarrhea, vomiting, vaginal discharge, urinary frequency, mild breast tenderness, mild dizziness and runny nose.  She had vomiting a couple days ago but not any since.  She also has some diarrhea that tends to happen after eating.  She does not have much of an appetite but is drinking lots of water and tolerating without difficulty.  The patient denies chills, constipation, dysuria, frequency, vaginal itching/irritation, hematochezia, hematuria, melena, sweats, flank pain, cough or congestion. The patient hasn't tried anything for her symptoms. No contacts with similar symptoms. She denies any history of COVID and has not been vaccinated against COVID-19. LMP 02/07/21. She is sexually active and not on birth control.   The following portions of the patient's history were reviewed and updated as appropriate: allergies, current medications, past family history, past medical history, past social history, past surgical history, and problem list.               Past Medical History:  Diagnosis Date   Anemia    hx of   Medical history non-contributory     Patient Active Problem List   Diagnosis Date Noted   Abdominal pain, epigastric 09/15/2020   Nausea with vomiting 09/15/2020   Vaginal delivery  10/13/2017   Post term pregnancy at [redacted] weeks gestation 10/12/2017    Past Surgical History:  Procedure Laterality Date   CHOLECYSTECTOMY N/A 11/15/2020   Procedure: LAPAROSCOPIC CHOLECYSTECTOMY;  Surgeon: Axel Filleramirez, Armando, MD;  [facility];  Service: General;  Laterality: N/A;   NO PAST SURGERIES      OB History     Gravida  1   Para  1   Term  1   Preterm      AB      Living  1      SAB      IAB      Ectopic      Multiple  0   Live Births  1            Home Medications    Prior to Admission medications   Medication Sig Start Date End Date Taking? Authorizing Provider  ondansetron (ZOFRAN ODT) 8 MG disintegrating tablet Take 1 tablet (8 mg total) by mouth every 8 (eight) hours as needed for nausea or vomiting. 03/20/21  Yes Lurline IdolMurrill, Annette Liotta, FNP  Prenatal Vit-Fe Fumarate-FA (PRENATAL COMPLETE) 14-0.4 MG TABS Take 1 tablet by mouth daily. 03/20/21  Yes Lurline IdolMurrill, Richmond Coldren, FNP    Family History Family History  Problem Relation Age of Onset   Colon cancer Mother    Diabetes Father    Asthma Neg Hx    Heart disease Neg Hx    Hypertension Neg Hx    Stroke Neg Hx    Esophageal cancer Neg Hx  Pancreatic cancer Neg Hx    Liver disease Neg Hx     Social History Social History   Tobacco Use   Smoking status: Never   Smokeless tobacco: Never  Vaping Use   Vaping Use: Never used  Substance Use Topics   Alcohol use: No   Drug use: No     Allergies   Patient has no known allergies.   Review of Systems Review of Systems  Constitutional:  Positive for appetite change, fatigue and fever. Negative for chills and diaphoresis.  HENT:  Positive for rhinorrhea. Negative for congestion.   Respiratory:  Negative for cough and shortness of breath.   Cardiovascular:  Negative for chest pain.  Gastrointestinal:  Positive for abdominal pain, diarrhea, nausea and vomiting.  Genitourinary:  Positive for frequency and vaginal discharge. Negative for  dysuria.  Musculoskeletal:  Negative for myalgias.  Skin:  Negative for rash.  Neurological:  Positive for dizziness and headaches.  All other systems reviewed and are negative.   Physical Exam Triage Vital Signs ED Triage Vitals  Enc Vitals Group     BP 03/20/21 1122 (!) 102/59     Pulse Rate 03/20/21 1122 60     Resp 03/20/21 1122 16     Temp 03/20/21 1122 98.7 F (37.1 C)     Temp Source 03/20/21 1122 Oral     SpO2 03/20/21 1122 100 %     Weight --      Height --      Head Circumference --      Peak Flow --      Pain Score 03/20/21 1120 5     Pain Loc --      Pain Edu? --      Excl. in GC? --    No data found.  Updated Vital Signs BP (!) 102/59 (BP Location: Right Arm)   Pulse 60   Temp 98.7 F (37.1 C) (Oral)   Resp 16   SpO2 100%   Visual Acuity Right Eye Distance:   Left Eye Distance:   Bilateral Distance:    Right Eye Near:   Left Eye Near:    Bilateral Near:     Physical Exam Vitals reviewed.  Constitutional:      Appearance: She is well-developed.  HENT:     Head: Normocephalic.  Cardiovascular:     Rate and Rhythm: Normal rate and regular rhythm.     Heart sounds: Normal heart sounds.  Pulmonary:     Effort: Pulmonary effort is normal.  Abdominal:     General: Bowel sounds are normal.     Palpations: Abdomen is soft.     Tenderness: There is no abdominal tenderness. There is no right CVA tenderness or left CVA tenderness.  Skin:    General: Skin is warm and dry.  Neurological:     General: No focal deficit present.     Mental Status: She is alert and oriented to person, place, and time.  Psychiatric:        Mood and Affect: Mood normal.        Behavior: Behavior normal.     UC Treatments / Results  Labs (all labs ordered are listed, but only abnormal results are displayed) Labs Reviewed  POCT URINALYSIS DIPSTICK, ED / UC - Abnormal; Notable for the following components:      Result Value   Leukocytes,Ua TRACE (*)    All other  components within normal limits  POC URINE PREG, ED - Abnormal;  Notable for the following components:   Preg Test, Ur POSITIVE (*)    All other components within normal limits  SARS CORONAVIRUS 2 (TAT 6-24 HRS)  URINE CULTURE  CERVICOVAGINAL ANCILLARY ONLY    EKG   Radiology No results found.  Procedures Procedures (including critical care time)  Medications Ordered in UC Medications - No data to display  Initial Impression / Assessment and Plan / UC Course  I have reviewed the triage vital signs and the nursing notes.  Pertinent labs & imaging results that were available during my care of the patient were reviewed by me and considered in my medical decision making (see chart for details).     20 year old female presenting with multiple complaints that has been ongoing for the past 2 weeks. Symptoms have been stable since onset.  She has not tried anything for her symptoms.  Patient is afebrile.  Nontoxic-appearing.  Physical exam unremarkable.  UA unremarkable.  COVID test pending. Cervicovaginal cytology pending. Urine pregnancy positive.  Patient is now G2 P1. EDD 11/14/21. Gestational Age [redacted] weeks, 6 days. Briefly discussed pre-natal care options. Encouraged well-balanced diet, plenty of rest when needed and walking for exercise. Tylenol  only for pain/fever as needed. Avoiding alcohol. Minimal caffeine. Diet as tolerated. Prescription for prenatal vitamins and Zofran provided. Patient advised to schedule appointment with [facility] provider.    Today's evaluation has revealed no signs of a dangerous process. Discussed diagnosis with patient and/or guardian. Patient and/or guardian aware of their diagnosis, possible red flag symptoms to watch out for and need for close follow up. Patient and/or guardian understands verbal and written discharge instructions. Patient and/or guardian comfortable with plan and disposition.  Patient and/or guardian has a clear mental status at this time, good  insight into illness (after discussion and teaching) and has clear judgment to make decisions regarding their care  This care was provided during an unprecedented National Emergency due to the Novel Coronavirus (COVID-19) pandemic. COVID-19 infections and transmission risks place heavy strains on healthcare resources.  As this pandemic evolves, our facility, providers, and staff strive to respond fluidly, to remain operational, and to provide care relative to available resources and information. Outcomes are unpredictable and treatments are without well-defined guidelines. Further, the impact of COVID-19 on all aspects of urgent care, including the impact to patients seeking care for reasons other than COVID-19, is unavoidable during this national emergency. At this time of the global pandemic, management of patients has significantly changed, even for non-COVID positive patients given high local and regional COVID volumes at this time requiring high healthcare system and resource utilization. The standard of care for management of both COVID suspected and non-COVID suspected patients continues to change rapidly at the local, regional, national, and global levels. This patient was worked up and treated to the best available but ever changing evidence and resources available at this current time.   Documentation was completed with the aid of voice recognition software. Transcription may contain typographical errors. Final Clinical Impressions(s) / UC Diagnoses   Final diagnoses:  Less than [redacted] weeks gestation of pregnancy  Vaginal discharge  Fever, unspecified  Encounter for screening for COVID-19     Discharge Instructions      You may only take tylenol as needed for fevers/headache/body aches.  Drink plenty of fluids.  Stay in home isolation until you receive results of your COVID test.  We are also checking your vaginal discharge for any infection.  You will only be notified for  any positive  results.  You may go online to MyChart in the next few days and review all your results.  Go to the ED immediately if you get worse or have any other symptoms Follow-up with OBGYN for pregnancy. Call the number to arrange an appointment.  According to your last period of May 3rd, you are about [redacted] weeks pregnant. Your estimated due date is November 14, 2021.  A more accurate due date will be given to you based on your first ultrasound which will be done by obstetrics.   Congratulations!!!       ED Prescriptions     Medication Sig Dispense Auth. Provider   Prenatal Vit-Fe Fumarate-FA (PRENATAL COMPLETE) 14-0.4 MG TABS Take 1 tablet by mouth daily. 60 tablet [place], Lelon Mast, FNP   ondansetron (ZOFRAN ODT) 8 MG disintegrating tablet Take 1 tablet (8 mg total) by mouth every 8 (eight) hours as needed for nausea or vomiting. 20 tablet Lurline Idol, FNP      PDMP not reviewed this encounter.   Lurline Idol, [place] 03/20/21 1437

## 2021-03-20 NOTE — ED Triage Notes (Signed)
Pt reports lower abdominal pain, nausea, lower back pain radiates to legs x 2 weeks. Pain is worse when standing up for extend period of time. Pt has not taken meds for complaints. Denies dysuria

## 2021-03-20 NOTE — Discharge Instructions (Addendum)
You may only take tylenol as needed for fevers/headache/body aches.  Drink plenty of fluids.  Stay in home isolation until you receive results of your COVID test.  We are also checking your vaginal discharge for any infection.  You will only be notified for any positive results.  You may go online to MyChart in the next few days and review all your results.  Go to the ED immediately if you get worse or have any other symptoms Follow-up with OBGYN for pregnancy. Call the number to arrange an appointment.  According to your last period of May 3rd, you are about [redacted] weeks pregnant. Your estimated due date is November 14, 2021.  A more accurate due date will be given to you based on your first ultrasound which will be done by obstetrics.   Congratulations!!!

## 2021-03-21 LAB — CERVICOVAGINAL ANCILLARY ONLY
Bacterial Vaginitis (gardnerella): NEGATIVE
Candida Glabrata: NEGATIVE
Candida Vaginitis: POSITIVE — AB
Chlamydia: NEGATIVE
Comment: NEGATIVE
Comment: NEGATIVE
Comment: NEGATIVE
Comment: NEGATIVE
Comment: NEGATIVE
Comment: NORMAL
Neisseria Gonorrhea: NEGATIVE
Trichomonas: NEGATIVE

## 2021-03-21 LAB — URINE CULTURE
Culture: 3000 — AB
Special Requests: NORMAL

## 2021-03-23 ENCOUNTER — Telehealth (HOSPITAL_COMMUNITY): Payer: Self-pay | Admitting: Emergency Medicine

## 2021-03-23 MED ORDER — AMOXICILLIN 500 MG PO CAPS: 500.0000 mg | ORAL_CAPSULE | Freq: Two times a day (BID) | ORAL | 0 refills | Status: AC

## 2021-03-23 MED ORDER — TERCONAZOLE 0.4 % VA CREA: 1.0000 | TOPICAL_CREAM | Freq: Every day | VAGINAL | 0 refills | Status: DC

## 2021-03-30 ENCOUNTER — Encounter: Payer: Medicaid Other | Admitting: Obstetrics and Gynecology

## 2021-04-04 ENCOUNTER — Ambulatory Visit (INDEPENDENT_AMBULATORY_CARE_PROVIDER_SITE_OTHER): Payer: Medicaid Other | Admitting: Primary Care

## 2021-04-05 ENCOUNTER — Inpatient Hospital Stay (HOSPITAL_COMMUNITY)
Admission: EM | Admit: 2021-04-05 | Discharge: 2021-04-05 | Disposition: A | Discharge status: Home / Self Care | Payer: Medicaid Other | Attending: Obstetrics & Gynecology | Admitting: Obstetrics & Gynecology

## 2021-04-05 ENCOUNTER — Other Ambulatory Visit: Payer: Self-pay

## 2021-04-05 ENCOUNTER — Encounter (HOSPITAL_COMMUNITY): Payer: Self-pay | Admitting: *Deleted

## 2021-04-05 DIAGNOSIS — O209 Hemorrhage in early pregnancy, unspecified: Secondary | ICD-10-CM | POA: Insufficient documentation

## 2021-04-05 DIAGNOSIS — O26891 Other specified pregnancy related conditions, first trimester: Secondary | ICD-10-CM

## 2021-04-05 DIAGNOSIS — Z3A00 Weeks of gestation of pregnancy not specified: Secondary | ICD-10-CM | POA: Insufficient documentation

## 2021-04-05 DIAGNOSIS — R102 Pelvic and perineal pain: Secondary | ICD-10-CM | POA: Diagnosis not present

## 2021-04-05 DIAGNOSIS — O4690 Antepartum hemorrhage, unspecified, unspecified trimester: Secondary | ICD-10-CM

## 2021-04-05 DIAGNOSIS — Z6790 Unspecified blood type, Rh positive: Secondary | ICD-10-CM

## 2021-04-05 DIAGNOSIS — O2690 Pregnancy related conditions, unspecified, unspecified trimester: Secondary | ICD-10-CM | POA: Diagnosis not present

## 2021-04-05 LAB — WET PREP, GENITAL
Clue Cells Wet Prep HPF POC: NONE SEEN
Sperm: NONE SEEN
Trich, Wet Prep: NONE SEEN
Yeast Wet Prep HPF POC: NONE SEEN

## 2021-04-05 LAB — URINALYSIS, ROUTINE W REFLEX MICROSCOPIC
Bilirubin Urine: NEGATIVE
Glucose, UA: NEGATIVE mg/dL
Hgb urine dipstick: NEGATIVE
Ketones, ur: NEGATIVE mg/dL
Nitrite: NEGATIVE
Protein, ur: NEGATIVE mg/dL
Specific Gravity, Urine: 1.02 (ref 1.005–1.030)
pH: 7 (ref 5.0–8.0)

## 2021-04-05 LAB — CBC
HCT: 33.7 % — ABNORMAL LOW (ref 36.0–46.0)
Hemoglobin: 11.2 g/dL — ABNORMAL LOW (ref 12.0–15.0)
MCH: 28.5 pg (ref 26.0–34.0)
MCHC: 33.2 g/dL (ref 30.0–36.0)
MCV: 85.8 fL (ref 80.0–100.0)
Platelets: 266 10*3/uL (ref 150–400)
RBC: 3.93 MIL/uL (ref 3.87–5.11)
RDW: 13.8 % (ref 11.5–15.5)
WBC: 7.2 10*3/uL (ref 4.0–10.5)
nRBC: 0 % (ref 0.0–0.2)

## 2021-04-05 LAB — OB RESULTS CONSOLE GBS: GBS: POSITIVE

## 2021-04-05 LAB — HCG, QUANTITATIVE, PREGNANCY: hCG, Beta Chain, Quant, S: 138539 m[IU]/mL — ABNORMAL HIGH (ref <5)

## 2021-04-05 NOTE — MAU Note (Signed)
Provider discharged patient from lobby. No VS or AVS signed.

## 2021-04-05 NOTE — MAU Note (Signed)
Patients sister has arrived to pick up patients daughter. RN walked patients daughter out to the lobby and handed patients daughter off to the sister.

## 2021-04-05 NOTE — MAU Provider Note (Signed)
Chief Complaint: Vaginal Bleeding and Abdominal Pain   Event Date/Time   First Provider Initiated Contact with Patient 04/05/21 1708      SUBJECTIVE HPI: Catherine Nixon is a 20 y.o. G2P1001 at [redacted]w[redacted]d by LMP who presents to maternity admissions reporting onset of abdominal pain 2 weeks ago that is constant with new onset bleeding when urinating 1 day ago.  She has  not tried any treatments. There are no associated symptoms.  Location: lower abdomen, radiates to low back Quality: cramping Severity: 6/10 on pain scale Duration: 2 weeks Timing: constant Modifying factors: none Associated signs and symptoms: bleeding when wiping  HPI  Past Medical History:  Diagnosis Date   Anemia    hx of   Medical history non-contributory    Past Surgical History:  Procedure Laterality Date   CHOLECYSTECTOMY N/A 11/15/2020   Procedure: LAPAROSCOPIC CHOLECYSTECTOMY;  Surgeon: Axel Filler, MD;  Location: [facility] OR;  Service: General;  Laterality: N/A;   NO PAST SURGERIES     Social History   Socioeconomic History   Marital status: Single    Spouse name: Not on file   Number of children: Not on file   Years of education: Not on file   Highest education level: Not on file  Occupational History   Not on file  Tobacco Use   Smoking status: Never   Smokeless tobacco: Never  Vaping Use   Vaping Use: Never used  Substance and Sexual Activity   Alcohol use: No   Drug use: No   Sexual activity: Never    Birth control/protection: Abstinence    Comment: FOB in [place]   Other Topics Concern   Not on file  Social History Narrative   Not on file   Social Determinants of Health   Financial Resource Strain: Not on file  Food Insecurity: Not on file  Transportation Needs: Not on file  Physical Activity: Not on file  Stress: Not on file  Social Connections: Not on file  Intimate Partner Violence: Not on file   No current facility-administered medications on file prior to encounter.    Current Outpatient Medications on File Prior to Encounter  Medication Sig Dispense Refill   Prenatal Vit-Fe Fumarate-FA (PRENATAL COMPLETE) 14-0.4 MG TABS Take 1 tablet by mouth daily. 60 tablet 0   ondansetron (ZOFRAN ODT) 8 MG disintegrating tablet Take 1 tablet (8 mg total) by mouth every 8 (eight) hours as needed for nausea or vomiting. 20 tablet 0   terconazole (TERAZOL 7) 0.4 % vaginal cream Place 1 applicator vaginally at bedtime. 45 g 0   No Known Allergies  ROS:  Review of Systems  Constitutional:  Negative for chills, fatigue and fever.  Respiratory:  Negative for shortness of breath.   Cardiovascular:  Negative for chest pain.  Gastrointestinal:  Positive for abdominal pain. Negative for nausea and vomiting.  Genitourinary:  Positive for pelvic pain and vaginal bleeding. Negative for difficulty urinating, dysuria, flank pain, vaginal discharge and vaginal pain.  Musculoskeletal:  Positive for back pain.  Neurological:  Negative for dizziness and headaches.  Psychiatric/Behavioral: Negative.      I have reviewed patient's Past Medical Hx, Surgical Hx, Family Hx, Social Hx, medications and allergies.   Physical Exam  Patient Vitals for the past 24 hrs:  BP Temp Temp src Pulse Resp SpO2 Weight  04/05/21 1636 110/65 -- -- 67 -- 100 % --  04/05/21 1558 (!) 102/48 98.6 F (37 C) Oral 63 19 100 % --  04/05/21  1556 -- -- -- -- -- -- 55.9 kg  04/05/21 1326 (!) 106/57 98.6 F (37 C) Oral 79 16 100 % --   Constitutional: Well-developed, well-nourished female in no acute distress.  Cardiovascular: normal rate Respiratory: normal effort GI: Abd soft, non-tender. Pos BS x 4 MS: Extremities nontender, no edema, normal ROM Neurologic: Alert and oriented x 4.  GU: Neg CVAT.  PELVIC EXAM: Cervix pink, visually closed, without lesion, scant white creamy discharge, no bleeding visualized, vaginal walls and external genitalia normal Bimanual exam: Cervix 0/long/high, firm,  anterior, neg CMT, uterus nontender, ~ 12 weeks in size, adnexa without tenderness, enlargement, or mass  FHT 152 by doppler  LAB RESULTS Results for orders placed or performed during the hospital encounter of 04/05/21 (from the past 24 hour(s))  Urinalysis, Routine w reflex microscopic Urine, Clean Catch     Status: Abnormal   Collection Time: 04/05/21  4:30 PM  Result Value Ref Range   Color, Urine YELLOW YELLOW   APPearance TURBID (A) CLEAR   Specific Gravity, Urine 1.020 1.005 - 1.030   pH 7.0 5.0 - 8.0   Glucose, UA NEGATIVE NEGATIVE mg/dL   Hgb urine dipstick NEGATIVE NEGATIVE   Bilirubin Urine NEGATIVE NEGATIVE   Ketones, ur NEGATIVE NEGATIVE mg/dL   Protein, ur NEGATIVE NEGATIVE mg/dL   Nitrite NEGATIVE NEGATIVE   Leukocytes,Ua TRACE (A) NEGATIVE   RBC / HPF 0-5 0 - 5 RBC/hpf   WBC, UA 0-5 0 - 5 WBC/hpf   Bacteria, UA MANY (A) NONE SEEN   Squamous Epithelial / LPF 6-10 0 - 5   Mucus PRESENT    Amorphous Crystal PRESENT   Wet prep, genital     Status: Abnormal   Collection Time: 04/05/21  5:07 PM   Specimen: Cervix  Result Value Ref Range   Yeast Wet Prep HPF POC NONE SEEN NONE SEEN   Trich, Wet Prep NONE SEEN NONE SEEN   Clue Cells Wet Prep HPF POC NONE SEEN NONE SEEN   WBC, Wet Prep HPF POC MANY (A) NONE SEEN   Sperm NONE SEEN   CBC     Status: Abnormal   Collection Time: 04/05/21  5:19 PM  Result Value Ref Range   WBC 7.2 4.0 - 10.5 K/uL   RBC 3.93 3.87 - 5.11 MIL/uL   Hemoglobin 11.2 (L) 12.0 - 15.0 g/dL   HCT 82.5 (L) 05.3 - 97.6 %   MCV 85.8 80.0 - 100.0 fL   MCH 28.5 26.0 - 34.0 pg   MCHC 33.2 30.0 - 36.0 g/dL   RDW 73.4 19.3 - 79.0 %   Platelets 266 150 - 400 K/uL   nRBC 0.0 0.0 - 0.2 %       IMAGING No results found.  MAU Management/MDM: Orders Placed This Encounter  Procedures   Wet prep, genital   Urinalysis, Routine w reflex microscopic Urine, Clean Catch   CBC   hCG, quantitative, pregnancy    No orders of the defined types were  placed in this encounter.   Pt without bleeding on today's exam. No acute findings.  FHT wnl today.  F/U with early prenatal care.  Return to MAU as needed for emergencies.   ASSESSMENT 1. Vaginal bleeding during pregnancy   2. Vaginal bleeding in pregnancy, first trimester   3. Abdominal pain during pregnancy, first trimester   4. Blood type, Rh positive      PLAN Discharge home Allergies as of 04/05/2021   No Known Allergies  Medication List     TAKE these medications    ondansetron 8 MG disintegrating tablet Commonly known as: Zofran ODT Take 1 tablet (8 mg total) by mouth every 8 (eight) hours as needed for nausea or vomiting.   Prenatal Complete 14-0.4 MG Tabs Take 1 tablet by mouth daily.   terconazole 0.4 % vaginal cream Commonly known as: TERAZOL 7 Place 1 applicator vaginally at bedtime.          Sharen Counter Certified Nurse-Midwife 04/05/2021  5:47 PM

## 2021-04-05 NOTE — MAU Note (Signed)
Presents with c/o lower abdominal cramping, back pain and spotting.  LMP 01/06/2021.  Reports +UPT @ Urgent Care.

## 2021-04-05 NOTE — ED Provider Notes (Signed)
Emergency Medicine Provider OB Triage Evaluation Note  Catherine Nixon is a 20 y.o. female, G2P1001, at Unknown gestation who presents to the emergency department with complaints of pelvic pain.  She has had intermittent pelvic pain.  She has not had any prenatal care so far this pregnancy.  She states that she has had occasional vaginal bleeding when wiping over the past 2 days.  No ultrasound so far this pregnancy per her report, she has a positive pregnancy test in the system.  Review of  Systems  Positive: Cramping, bleeding Negative: fevers, syncope  Physical Exam  BP (!) 106/57 (BP Location: Left Arm)   Pulse 79   Temp 98.6 F (37 C) (Oral)   Resp 16   SpO2 100%  General: Awake, no distress  HEENT: Atraumatic  Resp: Normal effort  Cardiac: Normal rate Abd: Nondistended, nontender  MSK: Moves all extremities without difficulty Neuro: Speech clear  Medical Decision Making  Pt evaluated for pregnancy concern and is stable for transfer to MAU. Pt is in agreement with plan for transfer.  3:20 PM Discussed with MAU Midwife, who accepts patient in transfer.  Clinical Impression   1. Vaginal bleeding during pregnancy        Norman Clay 04/05/21 1520    Pricilla Loveless, MD 04/08/21 1520

## 2021-04-06 LAB — GC/CHLAMYDIA PROBE AMP (~~LOC~~) NOT AT ARMC
Chlamydia: NEGATIVE
Comment: NEGATIVE
Comment: NORMAL
Neisseria Gonorrhea: NEGATIVE

## 2021-04-06 LAB — CULTURE, OB URINE: Culture: >=100000 — AB

## 2021-04-07 ENCOUNTER — Other Ambulatory Visit: Payer: Self-pay | Admitting: Advanced Practice Midwife

## 2021-04-07 DIAGNOSIS — R8271 Bacteriuria: Secondary | ICD-10-CM | POA: Insufficient documentation

## 2021-04-07 MED ORDER — AMOXICILLIN 500 MG PO CAPS
500.0000 mg | ORAL_CAPSULE | Freq: Three times a day (TID) | ORAL | 2 refills | Status: DC
Start: 2021-04-07 — End: 2021-05-24

## 2021-04-07 NOTE — Progress Notes (Signed)
>   100k colonies GBS on urine culture. Rx to pharmacy. NKDA and preferred pharmacy confirmed. Patient notified via phone call with assistance from [facility] Interpreter #[id number] Lavonia Dana, MSN, CNM Certified Nurse Midwife, [facility] for [facility], [facility] 04/07/21 8:26 AM

## 2021-04-11 NOTE — Progress Notes (Deleted)
NEUROLOGY CONSULTATION NOTE  Catherine Nixon MRN: [id number] DOB: 2001-07-11  Referring provider: Gwinda Passe, NP Primary care provider: Gwinda Passe, NP  Reason for consult:  headache  Assessment/Plan:   ***   Subjective:  Catherine Nixon is a 20 year old ***-handed female who presents for headache  History supplemented by referring provider's note.  Onset:  *** Location:  right-frontal  Quality:  throbbing Intensity:  6/10.   Aura:  none Prodrome:  none Associated symptoms:  Nasal congestion, rhinorrhea, eye lacrimation, blurred vision.  She denies associated nausea, vomiting, photophobia, phonophobia, , unilateral numbness or weakness. Duration:  *** Frequency:  *** Frequency of abortive medication: *** Triggers:  *** Relieving factors:  *** Activity:  ***  Current NSAIDS/analgesics:  *** Current triptans:  none Current ergotamine:  none Current anti-emetic:  Zofran ODT 8mg  Current muscle relaxants:  none Current Antihypertensive medications:  none Current Antidepressant medications:  none Current Anticonvulsant medications:  none Current anti-CGRP:  none Current Vitamins/Herbal/Supplements:  *** Current Antihistamines/Decongestants:  none Other therapy:  *** Hormone/birth control:  none  Past NSAIDS/analgesics:  naproxen 500mg  Past abortive triptans:  *** Past abortive ergotamine:  *** Past muscle relaxants:  *** Past anti-emetic:  *** Past antihypertensive medications:  *** Past antidepressant medications:  *** Past anticonvulsant medications:  *** Past anti-CGRP:  *** Past vitamins/Herbal/Supplements:  *** Past antihistamines/decongestants:  *** Other past therapies:  ***  Caffeine:  *** Alcohol:  *** Smoker:  *** Diet:  *** Exercise:  *** Depression:  ***; Anxiety:  *** Other pain:  *** Sleep hygiene:  *** Family history of headache:  ***      PAST MEDICAL HISTORY: Past Medical History:  Diagnosis Date   Anemia    hx of    Medical history non-contributory     PAST SURGICAL HISTORY: Past Surgical History:  Procedure Laterality Date   CHOLECYSTECTOMY N/A 11/15/2020   Procedure: LAPAROSCOPIC CHOLECYSTECTOMY;  Surgeon: , MD;  Location: [facility] OR;  Service: General;  Laterality: N/A;   NO PAST SURGERIES      MEDICATIONS: Current Outpatient Medications on File Prior to Visit  Medication Sig Dispense Refill   amoxicillin (AMOXIL) 500 MG capsule Take 1 capsule (500 mg total) by mouth 3 (three) times daily. 21 capsule 2   ondansetron (ZOFRAN ODT) 8 MG disintegrating tablet Take 1 tablet (8 mg total) by mouth every 8 (eight) hours as needed for nausea or vomiting. 20 tablet 0   Prenatal Vit-Fe Fumarate-FA (PRENATAL COMPLETE) 14-0.4 MG TABS Take 1 tablet by mouth daily. 60 tablet 0   terconazole (TERAZOL 7) 0.4 % vaginal cream Place 1 applicator vaginally at bedtime. 45 g 0   No current facility-administered medications on file prior to visit.    ALLERGIES: No Known Allergies  FAMILY HISTORY: Family History  Problem Relation Age of Onset   Colon cancer Mother    Diabetes Father    Asthma Neg Hx    Heart disease Neg Hx    Hypertension Neg Hx    Stroke Neg Hx    Esophageal cancer Neg Hx    Pancreatic cancer Neg Hx    Liver disease Neg Hx     Objective:  *** General: No acute distress.  Patient appears well-groomed.   Head:  Normocephalic/atraumatic Eyes:  fundi examined but not visualized Neck: supple, no paraspinal tenderness, full range of motion Back: No paraspinal tenderness Heart: regular rate and rhythm Lungs: Clear to auscultation bilaterally. Vascular: No carotid bruits. Neurological Exam: Mental status: alert  and oriented to person, place, and time, recent and remote memory intact, fund of knowledge intact, attention and concentration intact, speech fluent and not dysarthric, language intact. Cranial nerves: CN I: not tested CN II: pupils equal, round and reactive to light,  visual fields intact CN III, IV, VI:  full range of motion, no nystagmus, no ptosis CN V: facial sensation intact. CN VII: upper and lower face symmetric CN VIII: hearing intact CN IX, X: gag intact, uvula midline CN XI: sternocleidomastoid and trapezius muscles intact CN XII: tongue midline Bulk & Tone: normal, no fasciculations. Motor:  muscle strength 5/5 throughout Sensation:  Pinprick, temperature and vibratory sensation intact. Deep Tendon Reflexes:  2+ throughout,  toes downgoing.   Finger to nose testing:  Without dysmetria.   Heel to shin:  Without dysmetria.   Gait:  Normal station and stride.  Romberg negative.    Thank you for allowing me to take part in the care of this patient.  Shon Millet, DO  CC: ***

## 2021-04-13 ENCOUNTER — Ambulatory Visit: Payer: Medicaid Other | Admitting: Neurology

## 2021-04-18 ENCOUNTER — Ambulatory Visit (INDEPENDENT_AMBULATORY_CARE_PROVIDER_SITE_OTHER): Payer: Medicaid Other | Admitting: *Deleted

## 2021-04-18 ENCOUNTER — Other Ambulatory Visit: Payer: Self-pay

## 2021-04-18 VITALS — BP 95/60 | HR 78 | Temp 98.1°F | Wt 126.2 lb

## 2021-04-18 DIAGNOSIS — Z31430 Encounter of female for testing for genetic disease carrier status for procreative management: Secondary | ICD-10-CM | POA: Diagnosis not present

## 2021-04-18 DIAGNOSIS — Z3480 Encounter for supervision of other normal pregnancy, unspecified trimester: Secondary | ICD-10-CM | POA: Insufficient documentation

## 2021-04-18 LAB — HEPATITIS C ANTIBODY: HCV Ab: NEGATIVE

## 2021-04-18 MED ORDER — BLOOD PRESSURE MONITOR AUTOMAT DEVI: 1.0000 | Freq: Every day | 0 refills | Status: DC

## 2021-04-18 MED ORDER — GOJJI WEIGHT SCALE MISC: 1.0000 | Freq: Every day | 0 refills | Status: DC | PRN

## 2021-04-18 NOTE — Patient Instructions (Addendum)
   Genetic Screening Results Information: You are having genetic testing called [facility] today.  It will take approximately 2 weeks before the results are available.  To get your results, you need Internet access to a web browser to search [place]/MyChart (the direct app on your phone will not give you these results).  Then select Lab Scanned and click on the blue hyper link that says View Image to see your [facility] results.  You can also use the directions on the purple card given to look up your results directly on the [facility] website.  

## 2021-04-18 NOTE — Progress Notes (Signed)
   Location: [facility] Patient: clinic with her husband Louis Provider: clinic Spanish Interpreter: AMN video; Matthew Saras ID [id number]  PRENATAL INTAKE SUMMARY  Ms. Gasper presents today New OB Nurse Interview.  OB History     Gravida  2   Para  1   Term  1   Preterm      AB      Living  1      SAB      IAB      Ectopic      Multiple  0   Live Births  1          I have reviewed the patient's medical, obstetrical, social, and family histories, medications, and available lab results.  SUBJECTIVE She has no unusual complaints  OBJECTIVE Initial Nurse interview for history/labs (New OB)  EDD: 10/17/2021 by LMP GA: [redacted]w[redacted]d G2P1001 FHT: 142  GENERAL APPEARANCE: alert, well appearing, in no apparent distress, oriented to person, place and time   ASSESSMENT Normal pregnancy  PLAN Prenatal care:  [facility] Pnl/HIV/Hep C OB Urine Culture-unable to urinate, obtain at next visit GC/CT (urine) at next visit with Raelyn Mora, CNM HgbEval/SMA/CF ([facility]) [facility] A1C Rx [facility] for blood pressure monitor and weight scale Continue PNV Sign up for Babyscripts AFP at next visit Anatomy ultrasound MFM complete +14 weeks ordered  Follow Up Instructions:   I discussed the assessment and treatment plan with the patient. The patient was provided an opportunity to ask questions and all were answered. The patient agreed with the plan and demonstrated an understanding of the instructions.   The patient was advised to call back or seek an in-person evaluation if the symptoms worsen or if the condition fails to improve as anticipated.  I provided 40 minutes of  face-to-face time during this encounter.  Clovis Pu, RN

## 2021-04-19 LAB — OBSTETRIC PANEL, INCLUDING HIV
Antibody Screen: NEGATIVE
Basophils Absolute: 0 10*3/uL (ref 0.0–0.2)
Basos: 0 %
EOS (ABSOLUTE): 0.2 10*3/uL (ref 0.0–0.4)
Eos: 2 %
HIV Screen 4th Generation wRfx: NONREACTIVE
Hematocrit: 33.9 % — ABNORMAL LOW (ref 34.0–46.6)
Hemoglobin: 10.9 g/dL — ABNORMAL LOW (ref 11.1–15.9)
Hepatitis B Surface Ag: NEGATIVE
Immature Grans (Abs): 0.1 10*3/uL (ref 0.0–0.1)
Immature Granulocytes: 1 %
Lymphocytes Absolute: 1.9 10*3/uL (ref 0.7–3.1)
Lymphs: 26 %
MCH: 28.2 pg (ref 26.6–33.0)
MCHC: 32.2 g/dL (ref 31.5–35.7)
MCV: 88 fL (ref 79–97)
Monocytes Absolute: 0.3 10*3/uL (ref 0.1–0.9)
Monocytes: 5 %
Neutrophils Absolute: 4.9 10*3/uL (ref 1.4–7.0)
Neutrophils: 66 %
Platelets: 269 10*3/uL (ref 150–450)
RBC: 3.87 x10E6/uL (ref 3.77–5.28)
RDW: 13.8 % (ref 11.7–15.4)
RPR Ser Ql: NONREACTIVE
Rh Factor: POSITIVE
Rubella Antibodies, IGG: 1.02 index (ref >0.99)
WBC: 7.4 10*3/uL (ref 3.4–10.8)

## 2021-04-19 LAB — HEMOGLOBIN A1C
Est. average glucose Bld gHb Est-mCnc: 108 mg/dL
Hgb A1c MFr Bld: 5.4 % (ref 4.8–5.6)

## 2021-04-19 LAB — HEPATITIS C ANTIBODY: Hep C Virus Ab: 0.2 s/co ratio (ref 0.0–0.9)

## 2021-05-10 ENCOUNTER — Ambulatory Visit (INDEPENDENT_AMBULATORY_CARE_PROVIDER_SITE_OTHER): Payer: Medicaid Other | Admitting: Primary Care

## 2021-05-10 ENCOUNTER — Other Ambulatory Visit: Payer: Self-pay

## 2021-05-10 ENCOUNTER — Encounter (INDEPENDENT_AMBULATORY_CARE_PROVIDER_SITE_OTHER): Payer: Self-pay | Admitting: Primary Care

## 2021-05-10 VITALS — BP 98/63 | HR 74 | Temp 97.3°F | Ht 59.0 in | Wt 124.6 lb

## 2021-05-10 DIAGNOSIS — Z20822 Contact with and (suspected) exposure to covid-19: Secondary | ICD-10-CM | POA: Diagnosis not present

## 2021-05-10 DIAGNOSIS — N63 Unspecified lump in unspecified breast: Secondary | ICD-10-CM

## 2021-05-10 NOTE — Progress Notes (Signed)
Noticed lump on right breast 2 months ago- states it is getting bigger  Now has lump on left breast as well

## 2021-05-10 NOTE — Progress Notes (Signed)
Subjective:     Catherine Nixon is an 20 y.o. female who presents for evaluation of a breast mass. Change was noted 2 month ago, and has been gradually worsening since first identified. Patient does routinely do self breast exams. The mass  presented during . The mass is tender. Patient admits to nipple discharge. Breast cancer risk factors include: nonePatient is feeling masses and she is pregnant explain mammary duct and has an appt with GYN tomorrow. Also, states she has been having problems with sinus and would like to be tested for COVID since co-workers are concerned  The following portions of the patient's history were reviewed and updated as appropriate: allergies, current medications, past family history, past medical history, past social history, past surgical history, and problem list.  Review of Systems Pertinent items are noted in HPI.     Objective:    BP 98/63 (BP Location: Right Arm, Patient Position: Sitting, Cuff Size: Normal)   Pulse 74   Temp (!) 97.3 F (36.3 C) (Temporal)   Ht 4\' 11"  (1.499 m)   Wt 124 lb 9.6 oz (56.5 kg)   LMP 01/10/2021 (Exact Date)   SpO2 94%   BMI 25.17 kg/m   General Appearance:    Alert, cooperative, no distress, appears stated age  Head:    Normocephalic, without obvious abnormality, atraumatic  Eyes:    PERRL, conjunctiva/corneas clear, EOM's intact, fundi    benign, both eyes  Ears:    Normal TM's and external ear canals, both ears  Nose:   Nares normal, septum midline, mucosa normal, no drainage    or sinus tenderness  Throat:   Lips, mucosa, and tongue normal; teeth and gums normal  Neck:   Supple, symmetrical, trachea midline, no adenopathy;    thyroid:  no enlargement/tenderness/nodules; no carotid   bruit or JVD  Back:     Symmetric, no curvature, ROM normal, no CVA tenderness  Lungs:     Clear to auscultation bilaterally, respirations unlabored  Chest Wall:    No tenderness or deformity   Heart:    Regular rate and rhythm, S1  and S2 normal, no murmur, rub   or gallop  Breast Exam:    tenderness, masses, or nipple abnormality  Abdomen:     Soft, non-tender, bowel sounds active all four quadrants,    no masses, no organomegaly  Genitalia:  deferred  Rectal:  deferred  Extremities:   Extremities normal, atraumatic, no cyanosis or edema  Pulses:   2+ and symmetric all extremities  Skin:   Skin color, texture, turgor normal, no rashes or lesions  Lymph nodes:   Cervical, supraclavicular, and axillary nodes normal  Neurologic:   CNII-XII intact, normal strength, sensation and reflexes    throughout      Assessment:    normal breast exam    Plan:  Catherine Nixon was seen today for breast mass.  Diagnoses and all orders for this visit:  Encounter for laboratory testing for COVID-19 virus -     Novel Coronavirus, NAA ([facility])  Breast mass in female Patient is pregnant normal body changes f/u tomorrow with gyn  Other orders -     SARS-COV-2, NAA 2 DAY TAT    Aniceto Boss

## 2021-05-11 ENCOUNTER — Telehealth (INDEPENDENT_AMBULATORY_CARE_PROVIDER_SITE_OTHER): Payer: Self-pay

## 2021-05-11 ENCOUNTER — Encounter: Payer: Medicaid Other | Admitting: Obstetrics and Gynecology

## 2021-05-11 LAB — NOVEL CORONAVIRUS, NAA: SARS-CoV-2, NAA: NOT DETECTED

## 2021-05-11 LAB — SARS-COV-2, NAA 2 DAY TAT

## 2021-05-11 NOTE — Telephone Encounter (Signed)
Copied from CRM [id number]. Topic: General - Other >> May 11, 2021  3:12 PM Gwenlyn Fudge wrote: Reason for CRM: Pt called stating that she is needing to have a letter that excuses her from work until she is able to get her covid results back. Please advise.

## 2021-05-17 ENCOUNTER — Encounter: Payer: Medicaid Other | Admitting: Certified Nurse Midwife

## 2021-05-17 ENCOUNTER — Encounter: Payer: Self-pay | Admitting: Certified Nurse Midwife

## 2021-05-17 ENCOUNTER — Other Ambulatory Visit: Payer: Self-pay

## 2021-05-17 ENCOUNTER — Other Ambulatory Visit (HOSPITAL_COMMUNITY)
Admission: RE | Admit: 2021-05-17 | Discharge: 2021-05-17 | Disposition: A | Discharge status: Home / Self Care | Payer: Medicaid Other | Source: Ambulatory Visit | Attending: Certified Nurse Midwife | Admitting: Certified Nurse Midwife

## 2021-05-17 ENCOUNTER — Ambulatory Visit (INDEPENDENT_AMBULATORY_CARE_PROVIDER_SITE_OTHER): Payer: Medicaid Other | Admitting: Certified Nurse Midwife

## 2021-05-17 VITALS — BP 106/67 | HR 76 | Temp 98.6°F | Wt 127.6 lb

## 2021-05-17 DIAGNOSIS — O0932 Supervision of pregnancy with insufficient antenatal care, second trimester: Secondary | ICD-10-CM

## 2021-05-17 DIAGNOSIS — Z3A18 18 weeks gestation of pregnancy: Secondary | ICD-10-CM

## 2021-05-17 DIAGNOSIS — Z3480 Encounter for supervision of other normal pregnancy, unspecified trimester: Secondary | ICD-10-CM | POA: Diagnosis not present

## 2021-05-17 NOTE — Progress Notes (Signed)
History:   Catherine Nixon is a 20 y.o. G2P1001 at [redacted]w[redacted]d by LMP being seen today for her first obstetrical visit.  Her obstetrical history is significant for group B strep colonizer. Patient intends to breast and bottle feed. Pregnancy history fully reviewed.  Patient reports nausea, vomiting, and abdominal pain with tenderness under her belly button and on the right side of her abdomen. Nausea is frequently throughout the day but she is able to eat and keep down food, only vomiting twice in the past month. Denies urinary symptoms and finished her antibiotics. No vaginal bleeding or discharge, fever, falls or recent illness .   HISTORY: OB History  Gravida Para Term Preterm AB Living  2 1 1  0 0 1  SAB IAB Ectopic Multiple Live Births  0 0 0 0 1    # Outcome Date GA Lbr Len/2nd Weight Sex Delivery Anes PTL Lv  2 Current           1 Term 10/13/17 [redacted]w[redacted]d 04:04 / 00:40 6 lb 2.9 oz (2.805 kg) F Vag-Spont EPI  LIV     Name: DONU-PEREZ,GIRL Marieelena     Apgar1: 9  Apgar5: 9    Last pap smear was done 2021 and was normal  Past Medical History:  Diagnosis Date   Medical history non-contributory    Past Surgical History:  Procedure Laterality Date   CHOLECYSTECTOMY N/A 11/15/2020   Procedure: LAPAROSCOPIC CHOLECYSTECTOMY;  Surgeon: 01/13/2021, MD;  Location: [facility] OR;  Service: General;  Laterality: N/A;   Family History  Problem Relation Age of Onset   Colon cancer Mother    Diabetes Father    Asthma Neg Hx    Heart disease Neg Hx    Hypertension Neg Hx    Stroke Neg Hx    Esophageal cancer Neg Hx    Pancreatic cancer Neg Hx    Liver disease Neg Hx    Social History   Tobacco Use   Smoking status: Never    Passive exposure: Never   Smokeless tobacco: Never  Vaping Use   Vaping Use: Never used  Substance Use Topics   Alcohol use: Never   Drug use: Never   No Known Allergies Current Outpatient Medications on File Prior to Visit  Medication Sig Dispense Refill    amoxicillin (AMOXIL) 500 MG capsule Take 1 capsule (500 mg total) by mouth 3 (three) times daily. 21 capsule 2   Blood Pressure Monitoring (BLOOD PRESSURE MONITOR AUTOMAT) DEVI 1 Device by Does not apply route daily. Automatic blood pressure cuff regular size. To monitor blood pressure regularly at home. ICD-10 code:Z34.90 1 each 0   Misc. Devices (GOJJI WEIGHT SCALE) MISC 1 Device by Does not apply route daily as needed. To weight self daily as needed at home. ICD-10 code: Z34.90 1 each 0   Prenatal Vit-Fe Fumarate-FA (PRENATAL COMPLETE) 14-0.4 MG TABS Take 1 tablet by mouth daily. 60 tablet 0   terconazole (TERAZOL 7) 0.4 % vaginal cream Place 1 applicator vaginally at bedtime. 45 g 0   No current facility-administered medications on file prior to visit.    Review of Systems Pertinent items noted in HPI and remainder of comprehensive ROS otherwise negative. Physical Exam:   Vitals:   05/17/21 1501  BP: 106/67  Pulse: 76  Temp: 98.6 F (37 C)  Weight: 127 lb 9.6 oz (57.9 kg)   Fetal Heart Rate (bpm): 141  Constitutional: Well-developed, well-nourished pregnant female in no acute distress.  HEENT: PERRLA Skin:  normal color and turgor, no rash Cardiovascular: normal rate & rhythm, no murmur Respiratory: normal effort, lung sounds clear throughout GI: Abd soft, tender below umbilicus and to the right - over where fetal heart tones heard and where waistband of pants are hitting, pos BS x 4, gravid appropriate for gestational age MS: Extremities nontender, no edema, normal ROM Neurologic: Alert and oriented x 4.  GU: no CVA tenderness Pelvic: Exam deferred  Assessment & Plan:  1. Initial obstetric visit in second trimester Initial labs reviewed Continue prenatal vitamins. Problem list reviewed and updated. Genetic Screening discussed, First trimester screen, Quad screen, and NIPS: results reviewed and FOB offered partner testing which he accepted. Ultrasound discussed; fetal  anatomic survey: ordered. Anticipatory guidance about prenatal visits given including labs, ultrasounds, and testing. Discussed usage of Babyscripts and virtual visits as additional source of managing and completing prenatal visits in midst of coronavirus and pandemic.   Encouraged to complete MyChart Registration for her ability to review results, send requests, and have questions addressed.  The nature of [place] [facility] Practice with multiple MDs and Advanced Practice Providers was explained to patient; also emphasized that residents, students are part of our team.  2. Supervision of other normal pregnancy, antepartum - Doing well overall, feeling regular and vigorous fetal movement - Abdominal pain appears to be related to skin being squeezed between waistband of pants and fetal positioning. Pain does not come in waves, gets worse as the day progresses but is better each morning. No fever, no vaginal bleeding/discharge, negative McBurney's, no hernia palpated, no interruption in digestion, etc. Pt counseled to present to MAU if pain worsens or continues or other symptoms arise even after use of maternity pants. - Culture, OB Urine (including GC/CT/TC)  3. [redacted] weeks gestation of pregnancy - Routine OB care - Urine cytology ancillary only([place]) - AFP, Serum, Open Spina Bifida  Routine obstetric precautions reviewed. Encouraged to seek out care at office or emergency room [facility] MAU preferred) for urgent and/or emergent concerns. Return in about 4 weeks (around 06/14/2021) for IN-PERSON, LOB.     Edd Arbour, MSN, CNM, IBCLC Certified Nurse Midwife, [facility]

## 2021-05-18 LAB — URINE CYTOLOGY ANCILLARY ONLY
Chlamydia: NEGATIVE
Comment: NEGATIVE
Comment: NEGATIVE
Comment: NORMAL
Neisseria Gonorrhea: NEGATIVE
Trichomonas: NEGATIVE

## 2021-05-19 LAB — AFP, SERUM, OPEN SPINA BIFIDA
AFP MoM: 0.59
AFP Value: 30.2 ng/mL
Gest. Age on Collection Date: 18 weeks
Maternal Age At EDD: 22.7 yr
OSBR Risk 1 IN: 10000
Test Results:: NEGATIVE
Weight: 127 [lb_av]

## 2021-05-22 ENCOUNTER — Ambulatory Visit: Payer: Medicaid Other | Attending: Obstetrics and Gynecology

## 2021-05-22 LAB — URINE CULTURE, OB REFLEX

## 2021-05-22 LAB — CULTURE, OB URINE

## 2021-05-24 ENCOUNTER — Other Ambulatory Visit: Payer: Self-pay | Admitting: *Deleted

## 2021-05-24 DIAGNOSIS — O2340 Unspecified infection of urinary tract in pregnancy, unspecified trimester: Secondary | ICD-10-CM

## 2021-05-24 MED ORDER — CEFADROXIL 500 MG PO CAPS: 500.0000 mg | ORAL_CAPSULE | Freq: Two times a day (BID) | ORAL | 0 refills | Status: AC

## 2021-05-30 DIAGNOSIS — U071 COVID-19: Secondary | ICD-10-CM | POA: Diagnosis not present

## 2021-05-30 DIAGNOSIS — Z20822 Contact with and (suspected) exposure to covid-19: Secondary | ICD-10-CM | POA: Diagnosis not present

## 2021-05-30 DIAGNOSIS — Z331 Pregnant state, incidental: Secondary | ICD-10-CM | POA: Diagnosis not present

## 2021-05-31 ENCOUNTER — Encounter (INDEPENDENT_AMBULATORY_CARE_PROVIDER_SITE_OTHER): Payer: Self-pay | Admitting: Primary Care

## 2021-06-09 ENCOUNTER — Encounter: Payer: Medicaid Other | Admitting: Certified Nurse Midwife

## 2021-06-16 ENCOUNTER — Other Ambulatory Visit: Payer: Self-pay

## 2021-06-16 ENCOUNTER — Ambulatory Visit: Payer: Medicaid Other | Attending: Obstetrics and Gynecology

## 2021-06-16 DIAGNOSIS — Z3480 Encounter for supervision of other normal pregnancy, unspecified trimester: Secondary | ICD-10-CM | POA: Insufficient documentation

## 2021-06-19 ENCOUNTER — Other Ambulatory Visit (HOSPITAL_COMMUNITY)
Admission: RE | Admit: 2021-06-19 | Discharge: 2021-06-19 | Disposition: A | Discharge status: Home / Self Care | Payer: Medicaid Other | Source: Ambulatory Visit | Attending: Obstetrics and Gynecology | Admitting: Obstetrics and Gynecology

## 2021-06-19 ENCOUNTER — Other Ambulatory Visit: Payer: Self-pay

## 2021-06-19 ENCOUNTER — Ambulatory Visit (INDEPENDENT_AMBULATORY_CARE_PROVIDER_SITE_OTHER): Payer: Medicaid Other

## 2021-06-19 VITALS — BP 84/56 | HR 66 | Temp 98.2°F | Wt 131.0 lb

## 2021-06-19 DIAGNOSIS — N898 Other specified noninflammatory disorders of vagina: Secondary | ICD-10-CM

## 2021-06-19 DIAGNOSIS — O26899 Other specified pregnancy related conditions, unspecified trimester: Secondary | ICD-10-CM | POA: Insufficient documentation

## 2021-06-19 NOTE — Progress Notes (Signed)
   SUBJECTIVE:  20 y.o. female complains of clear and green vaginal discharge for 1 week(s). Reported vaginal odor three days ago.  Denies abnormal vaginal bleeding or significant pelvic pain or fever. No UTI symptoms. Denies history of known exposure to STD. Last sexual encounter was two weeks ago unprotected.    Patient's last menstrual period was 01/10/2021 (exact date).  OBJECTIVE:  She appears well, afebrile. Urine dipstick: not done.  ASSESSMENT:  Vaginal Discharge  Vaginal Odor   PLAN:  GC, chlamydia, trichomonas, BVAG, CVAG probe sent to lab. Treatment: To be determined once lab results are received ROV prn if symptoms persist or worsen.    Gearldine Shown, CMA

## 2021-06-20 LAB — CERVICOVAGINAL ANCILLARY ONLY
Bacterial Vaginitis (gardnerella): POSITIVE — AB
Candida Glabrata: NEGATIVE
Candida Vaginitis: POSITIVE — AB
Chlamydia: NEGATIVE
Comment: NEGATIVE
Comment: NEGATIVE
Comment: NEGATIVE
Comment: NEGATIVE
Comment: NEGATIVE
Comment: NORMAL
Neisseria Gonorrhea: NEGATIVE
Trichomonas: NEGATIVE

## 2021-06-21 ENCOUNTER — Other Ambulatory Visit: Payer: Self-pay | Admitting: *Deleted

## 2021-06-21 DIAGNOSIS — B3731 Acute candidiasis of vulva and vagina: Secondary | ICD-10-CM

## 2021-06-21 DIAGNOSIS — N898 Other specified noninflammatory disorders of vagina: Secondary | ICD-10-CM

## 2021-06-21 DIAGNOSIS — B373 Candidiasis of vulva and vagina: Secondary | ICD-10-CM

## 2021-06-21 DIAGNOSIS — B9689 Other specified bacterial agents as the cause of diseases classified elsewhere: Secondary | ICD-10-CM

## 2021-06-21 DIAGNOSIS — N760 Acute vaginitis: Secondary | ICD-10-CM

## 2021-06-21 MED ORDER — METRONIDAZOLE 500 MG PO TABS: 500.0000 mg | ORAL_TABLET | Freq: Two times a day (BID) | ORAL | 0 refills | Status: DC

## 2021-06-21 MED ORDER — TERCONAZOLE 0.4 % VA CREA: 1.0000 | TOPICAL_CREAM | Freq: Every day | VAGINAL | 0 refills | Status: DC

## 2021-06-21 NOTE — Progress Notes (Signed)
+  BV and + yeast.Terazole vaginal cream and Metronidazole 500 mg sent to pharmacy.  Clovis Pu, RN

## 2021-06-24 ENCOUNTER — Other Ambulatory Visit: Payer: Self-pay | Admitting: Certified Nurse Midwife

## 2021-06-24 DIAGNOSIS — N760 Acute vaginitis: Secondary | ICD-10-CM

## 2021-06-24 DIAGNOSIS — B9689 Other specified bacterial agents as the cause of diseases classified elsewhere: Secondary | ICD-10-CM

## 2021-07-05 ENCOUNTER — Other Ambulatory Visit: Payer: Self-pay

## 2021-07-05 ENCOUNTER — Ambulatory Visit (INDEPENDENT_AMBULATORY_CARE_PROVIDER_SITE_OTHER): Payer: Medicaid Other | Admitting: Certified Nurse Midwife

## 2021-07-05 VITALS — BP 91/50 | HR 71 | Temp 98.4°F | Wt 133.4 lb

## 2021-07-05 DIAGNOSIS — Z3492 Encounter for supervision of normal pregnancy, unspecified, second trimester: Secondary | ICD-10-CM

## 2021-07-05 DIAGNOSIS — Z3A25 25 weeks gestation of pregnancy: Secondary | ICD-10-CM

## 2021-07-05 NOTE — Progress Notes (Signed)
   PRENATAL VISIT NOTE  Subjective:  Catherine Nixon is a 20 y.o. G2P1001 at [redacted]w[redacted]d being seen today for ongoing prenatal care.  She is currently monitored for the following issues for this low-risk pregnancy and has GBS bacteriuria and Supervision of other normal pregnancy, antepartum on their problem list.  Patient reports no complaints.  Contractions: Irritability. Vag. Bleeding: None.  Movement: Present. Denies leaking of fluid.   The following portions of the patient's history were reviewed and updated as appropriate: allergies, current medications, past family history, past medical history, past social history, past surgical history and problem list.   Spanish interpreter present via Status interpretation services Objective:   Vitals:   07/05/21 0846  BP: (!) 91/50  Pulse: 71  Temp: 98.4 F (36.9 C)  Weight: 133 lb 6.4 oz (60.5 kg)    Fetal Status: Fetal Heart Rate (bpm): 146 Fundal Height: 25 cm Movement: Present     General:  Alert, oriented and cooperative. Patient is in no acute distress.  Skin: Skin is warm and dry. No rash noted.   Cardiovascular: Normal heart rate noted  Respiratory: Normal respiratory effort, no problems with respiration noted  Abdomen: Soft, gravid, appropriate for gestational age.  Pain/Pressure: Present     Pelvic: Cervical exam deferred        Extremities: Normal range of motion.  Edema: None  Mental Status: Normal mood and affect. Normal behavior. Normal judgment and thought content.   Assessment and Plan:  Pregnancy: G2P1001 at [redacted]w[redacted]d 1. Supervision of low-risk pregnancy, second trimester - Doing well, feeling regular and vigorous fetal movement  2. [redacted] weeks gestation of pregnancy - Routine OB care including anticipatory guidance regarding the GTT at her next visit and need to be fasting. Pt expressed understanding.  Preterm labor symptoms and general obstetric precautions including but not limited to vaginal bleeding, contractions, leaking  of fluid and fetal movement were reviewed in detail with the patient. Please refer to After Visit Summary for other counseling recommendations.   Return in about 3 weeks (around 07/26/2021) for IN-PERSON, LOB/GTT.  Future Appointments  Date Time Provider Department Center  07/28/2021  8:35 AM Bernerd Limbo, CNM [facility] None  08/04/2021 10:30 AM [name], NP [facility] None  08/09/2021  8:55 AM Gerrit Heck, CNM [facility] None  08/25/2021  9:15 AM Gerrit Heck, CNM [facility] None    Bernerd Limbo, CNM

## 2021-07-05 NOTE — Patient Instructions (Signed)
    Please remember that if you are not able to keep your appointment to call the office and reschedule, or cancel. If you no-show or cancel with less than 24 hour notice we will charge you, not your insurance a $50 fee.  

## 2021-07-10 DIAGNOSIS — R109 Unspecified abdominal pain: Secondary | ICD-10-CM | POA: Diagnosis not present

## 2021-07-10 DIAGNOSIS — O26892 Other specified pregnancy related conditions, second trimester: Secondary | ICD-10-CM | POA: Diagnosis not present

## 2021-07-10 DIAGNOSIS — R1031 Right lower quadrant pain: Secondary | ICD-10-CM | POA: Diagnosis not present

## 2021-07-10 DIAGNOSIS — O98812 Other maternal infectious and parasitic diseases complicating pregnancy, second trimester: Secondary | ICD-10-CM | POA: Diagnosis not present

## 2021-07-10 DIAGNOSIS — B379 Candidiasis, unspecified: Secondary | ICD-10-CM | POA: Diagnosis not present

## 2021-07-10 DIAGNOSIS — Z3A26 26 weeks gestation of pregnancy: Secondary | ICD-10-CM | POA: Diagnosis not present

## 2021-07-28 ENCOUNTER — Encounter: Payer: Self-pay | Admitting: Certified Nurse Midwife

## 2021-07-28 ENCOUNTER — Other Ambulatory Visit: Payer: Self-pay

## 2021-07-28 ENCOUNTER — Ambulatory Visit (INDEPENDENT_AMBULATORY_CARE_PROVIDER_SITE_OTHER): Payer: Medicaid Other | Admitting: Certified Nurse Midwife

## 2021-07-28 VITALS — BP 93/59 | HR 79 | Temp 97.9°F | Wt 137.8 lb

## 2021-07-28 DIAGNOSIS — Z3480 Encounter for supervision of other normal pregnancy, unspecified trimester: Secondary | ICD-10-CM

## 2021-07-28 DIAGNOSIS — Z3A28 28 weeks gestation of pregnancy: Secondary | ICD-10-CM | POA: Diagnosis not present

## 2021-07-28 DIAGNOSIS — Z822 Family history of deafness and hearing loss: Secondary | ICD-10-CM

## 2021-07-28 NOTE — Progress Notes (Signed)
   PRENATAL VISIT NOTE  Subjective:  Catherine Nixon is a 20 y.o. G2P1001 at [redacted]w[redacted]d being seen today for ongoing prenatal care.  She is currently monitored for the following issues for this low-risk pregnancy and has GBS bacteriuria and Supervision of other normal pregnancy, antepartum on their problem list.  Patient reports no complaints. Expressed concern that this baby may be deaf since FOB is deaf and she has family members on her side who are also deaf. Wants to know if there are tests that can confirm the baby's hearing ability prior to birth. Contractions: Irritability. Vag. Bleeding: None.  Movement: Present. Denies leaking of fluid.   The following portions of the patient's history were reviewed and updated as appropriate: allergies, current medications, past family history, past medical history, past social history, past surgical history and problem list.   Objective:   Vitals:   07/28/21 0832 07/28/21 0833  BP: (!) 83/48 (!) 93/59  Pulse: 73 79  Temp: 97.9 F (36.6 C)   Weight: 137 lb 12.8 oz (62.5 kg)     Fetal Status: Fetal Heart Rate (bpm): 138 Fundal Height: 27 cm Movement: Present     General:  Alert, oriented and cooperative. Patient is in no acute distress.  Skin: Skin is warm and dry. No rash noted.   Cardiovascular: Normal heart rate noted  Respiratory: Normal respiratory effort, no problems with respiration noted  Abdomen: Soft, gravid, appropriate for gestational age.  Pain/Pressure: Present     Pelvic: Cervical exam deferred        Extremities: Normal range of motion.  Edema: None  Mental Status: Normal mood and affect. Normal behavior. Normal judgment and thought content.   Assessment and Plan:  Pregnancy: G2P1001 at [redacted]w[redacted]d 1. Supervision of other normal pregnancy, antepartum - Doing well, feeling regular and vigorous fetal movement   2. [redacted] weeks gestation of pregnancy - Routine OB care  - Glucose Tolerance, 2 Hours w/1 Hour - HIV Antibody (routine  testing w rflx) - RPR - CBC  3. Family history of deafness - Advised that it's likely we can tell structural integrity but not necessarily function until the baby is born. Suggested a genetic counseling appointment to discuss any testing available to her, pt agreed. - AMB referral to maternal fetal medicine  Preterm labor symptoms and general obstetric precautions including but not limited to vaginal bleeding, contractions, leaking of fluid and fetal movement were reviewed in detail with the patient. Please refer to After Visit Summary for other counseling recommendations.   Return in about 2 weeks (around 08/11/2021) for IN-PERSON, LOB.  Future Appointments  Date Time Provider Department Center  08/04/2021 10:30 AM [name], NP [facility] None  08/09/2021  8:55 AM Gerrit Heck, CNM [facility] None  08/25/2021  9:15 AM Gerrit Heck, CNM [facility] None    Bernerd Limbo, CNM

## 2021-07-29 LAB — GLUCOSE TOLERANCE, 2 HOURS W/ 1HR
Glucose, 1 hour: 121 mg/dL (ref 70–179)
Glucose, 2 hour: 71 mg/dL (ref 70–152)
Glucose, Fasting: 74 mg/dL (ref 70–91)

## 2021-07-29 LAB — CBC
Hematocrit: 31.2 % — ABNORMAL LOW (ref 34.0–46.6)
Hemoglobin: 10.1 g/dL — ABNORMAL LOW (ref 11.1–15.9)
MCH: 27.8 pg (ref 26.6–33.0)
MCHC: 32.4 g/dL (ref 31.5–35.7)
MCV: 86 fL (ref 79–97)
Platelets: 246 10*3/uL (ref 150–450)
RBC: 3.63 x10E6/uL — ABNORMAL LOW (ref 3.77–5.28)
RDW: 12.1 % (ref 11.7–15.4)
WBC: 7.8 10*3/uL (ref 3.4–10.8)

## 2021-07-29 LAB — HIV ANTIBODY (ROUTINE TESTING W REFLEX): HIV Screen 4th Generation wRfx: NONREACTIVE

## 2021-07-29 LAB — RPR: RPR Ser Ql: NONREACTIVE

## 2021-08-04 ENCOUNTER — Ambulatory Visit (INDEPENDENT_AMBULATORY_CARE_PROVIDER_SITE_OTHER): Payer: Medicaid Other | Admitting: Primary Care

## 2021-08-10 ENCOUNTER — Ambulatory Visit (INDEPENDENT_AMBULATORY_CARE_PROVIDER_SITE_OTHER): Payer: Medicaid Other | Admitting: Obstetrics and Gynecology

## 2021-08-10 ENCOUNTER — Other Ambulatory Visit: Payer: Self-pay

## 2021-08-10 ENCOUNTER — Other Ambulatory Visit (HOSPITAL_COMMUNITY)
Admission: RE | Admit: 2021-08-10 | Discharge: 2021-08-10 | Disposition: A | Discharge status: Home / Self Care | Payer: Medicaid Other | Source: Ambulatory Visit | Attending: Obstetrics and Gynecology | Admitting: Obstetrics and Gynecology

## 2021-08-10 ENCOUNTER — Ambulatory Visit: Payer: Medicaid Other | Attending: Obstetrics and Gynecology

## 2021-08-10 VITALS — BP 91/57 | HR 98 | Temp 97.9°F | Wt 136.4 lb

## 2021-08-10 DIAGNOSIS — N898 Other specified noninflammatory disorders of vagina: Secondary | ICD-10-CM

## 2021-08-10 DIAGNOSIS — Z3A30 30 weeks gestation of pregnancy: Secondary | ICD-10-CM | POA: Diagnosis not present

## 2021-08-10 DIAGNOSIS — Z3480 Encounter for supervision of other normal pregnancy, unspecified trimester: Secondary | ICD-10-CM | POA: Diagnosis not present

## 2021-08-10 DIAGNOSIS — O26899 Other specified pregnancy related conditions, unspecified trimester: Secondary | ICD-10-CM | POA: Diagnosis not present

## 2021-08-10 DIAGNOSIS — Z822 Family history of deafness and hearing loss: Secondary | ICD-10-CM | POA: Diagnosis not present

## 2021-08-10 NOTE — Progress Notes (Signed)
   LOW-RISK PREGNANCY OFFICE VISIT Patient name: Catherine Nixon MRN [id number]  Date of birth: 2001-09-10 Chief Complaint:   Routine Prenatal Visit  History of Present Illness:   Catherine Nixon is a 20 y.o. G72P1001 female at [redacted]w[redacted]d with an Estimated Date of Delivery: 10/17/21 being seen today for ongoing management of a low-risk pregnancy.  Today she reports  gree vaginal discharge . She denies odor, itching or burning. Contractions: Irritability. Vag. Bleeding: None.  Movement: Present. denies leaking of fluid. Review of Systems:   Pertinent items are noted in HPI Denies abnormal vaginal discharge w/ itching/odor/irritation, headaches, visual changes, shortness of breath, chest pain, abdominal pain, severe nausea/vomiting, or problems with urination or bowel movements unless otherwise stated above. Pertinent History Reviewed:  Reviewed past medical,surgical, social, obstetrical and family history.  Reviewed problem list, medications and allergies. Physical Assessment:   Vitals:   08/10/21 1625  BP: (!) 91/57  Pulse: 98  Temp: 97.9 F (36.6 C)  Weight: 136 lb 6.4 oz (61.9 kg)  Body mass index is 27.55 kg/m.        Physical Examination:   General appearance: Well appearing, and in no distress  Mental status: Alert, oriented to person, place, and time  Skin: Warm & dry  Cardiovascular: Normal heart rate noted  Respiratory: Normal respiratory effort, no distress  Abdomen: Soft, gravid, nontender  Pelvic: Cervical exam deferred wet prep obtained by patient via blind swab        Extremities: Edema: None  Fetal Status: Fetal Heart Rate (bpm): 136 Fundal Height: 28 cm Movement: Present    No results found for this or any previous visit (from the past 24 hour(s)).  Assessment & Plan:  1) Low-risk pregnancy G2P1001 at [redacted]w[redacted]d with an Estimated Date of Delivery: 10/17/21   2) Supervision of other normal pregnancy, antepartum   3) Vaginal discharge during pregnancy, antepartum  -  Cervicovaginal ancillary only( [place])  4) [redacted] weeks gestation of pregnancy    Meds: No orders of the defined types were placed in this encounter.  Labs/procedures today: wet prep  Plan:  Continue routine obstetrical care   Reviewed: Preterm labor symptoms and general obstetric precautions including but not limited to vaginal bleeding, contractions, leaking of fluid and fetal movement were reviewed in detail with the patient.  All questions were answered.   Follow-up: Return in about 4 weeks (around 09/07/2021) for Return OB visit.  No orders of the defined types were placed in this encounter.  Raelyn Mora MSN, CNM 08/10/2021 4:40 PM

## 2021-08-10 NOTE — Progress Notes (Signed)
Name: Catherine Nixon Indication: Family history of deafness  DOB: 2001-07-04 Age: 20 y.o.   EDC: 10/17/2021 LMP: 01/10/2021 Referring Provider: Raelyn Mora, CNM  EGA: [redacted]w[redacted]d Genetic Counselor: Teena Dunk, MS, CGC  OB Hx: [id number] Date of Appointment: 08/10/2021  Accompanied by: Spanish Interpreter Face to Face Time: 45 Minutes   Previous Testing Completed: Stephaine previously completed Non-Invasive Prenatal Screening (NIPS) in this pregnancy (scanned into Epic under the Media tab). The result is low risk. This screening significantly reduces the risk that the current pregnancy has Down syndrome, Trisomy 52, Trisomy 13, Monosomy X, and Triploidy, however, the risk is not zero given the limitations of NIPS. Additionally, there are many genetic conditions that cannot be detected by NIPS.  Danaya previously completed Lawyer in this pregnancy (scanned into Epic under the Media tab). 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 Positive for the pathogenic alpha 3.7 deletion of the HBA2 gene. Her husband, Jonetta Speak, screened to NOT be a carrier for this recessive genetic condition. Celsa screened to NOT be a carrier for Cystic Fibrosis (CF), Spinal Muscular Atrophy (SMA), and beta hemoglobinopathies. A negative result on carrier screening reduces the likelihood of being a carrier, however, does not entirely rule out the possibility. Yaris previously completed a maternal serum AFP screen in this pregnancy. The result is screen negative.   Medical History:  This is Neville's 2nd pregnancy. She has a daughter from a previous partner. Denies personal history of diabetes, high blood pressure, thyroid conditions, and seizures. Denies using tobacco, alcohol, or street drugs in this pregnancy.   Family History: A pedigree was created and scanned into Epic under the Media tab. Deion reports her husband, Jonetta Speak, was born deaf. She reports he had some type of genetic testing over 20  years ago. No medical records or genetic testing results are available for genetic counseling to review. Chakara reports Jonetta Speak is otherwise healthy. Letitia reports Jonetta Speak' 31 year old sister was also born deaf. No medical records or genetic testing records are available for genetic counseling to review. Jayce reports this individual is otherwise healthy. Calysta reports Jonetta Speak' 43 year old brother has some hearing loss and might be developmentally delayed. She reports he went to school and he walks and talks. She does not know if he works.  Denine reports her paternal grandfather had a low percentage of hearing and died from cirrhosis.  Deshawn reports her paternal first cousin does not speak and has problems hearing. She attributed this individuals symptoms to potential neglect. Donnamarie reports her daughter from a previous partner had kidney issues as an infant. She describes that her daughter's kidneys were "leaking" and her daughter required 3 surgeries.  Maternal ethnicity reported as Hispanic and paternal ethnicity reported as Hispanic. Denies Ashkenazi Jewish ancestry. Family history not remarkable for consanguinity, individuals with birth defects, multiple spontaneous abortions, still births, or unexplained neonatal death.     Genetic Counseling:   Family History of Hearing Loss. Stasha's family history of hearing loss is detailed above. Hearing loss can present at birth or develop over time and can be caused by damage to structures of the inner ear (sensorineural), changes in the middle ear (conductive), or both inner and middle ear (mixed). Hearing loss can be caused by many factors including recurrent ear infections, various infections, ototoxic medications, head injury, and genetic causes. Hearing loss varies in affecting one or both ears, the range of loss, and it can be stable or progressive. Particular forms or causes  of hearing loss can show distinctive patterns. It is recommended that individuals with hearing  loss or deafness, particularly children, have a complete evaluation for causes including a genetics evaluation. If an individual is found to have a genetic cause, other family members have an increased chance to have hearing loss or a child with hearing loss. Specific risks depend on the inheritance pattern of the particular gene and degree of relationship to the individual with hearing loss. Dyllan reports that [place]' deafness comes from his mother's side. When asked for more information, Madisan reported that Jonetta Speak' mother "has the gene for hearing loss but can hear fine". Genetic counseling reviewed autosomal dominant and autosomal recessive inheritance with Regine. 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 Shelva rejected this idea and stated she knows it comes from [place]' mother. Genetic counseling also reviewed that she may be describing a dominant condition with incomplete penetrance. We suggested to [place] that it might be valuable for [place] to have genetic counseling and a genetics evaluation in a center that provides genetic testing for children and adults. Genetic counseling reviewed that if Jonetta Speak was diagnosed with a genetic cause of deafness, we could more accurately quote risk for the current pregnancy to be affected. Carrier screening for autosomal recessive genetic causes of hearing loss may also be considered to inform reproductive risks.    Patient Plan:  Proceed with: Malaysha is interested in having Luis evaluated for his deafness in a center that offers genetic counseling and a genetics evaluation for children and adults. We will attempt to help facilitate this referral for [facility].  All questions were answered.   Thank you for sharing in the care of Finn with [place].  Please do not hesitate to contact us if you have any questions.  Teena Dunk, MS,  [facility]

## 2021-08-11 ENCOUNTER — Encounter: Payer: Self-pay | Admitting: Obstetrics and Gynecology

## 2021-08-13 ENCOUNTER — Encounter (HOSPITAL_COMMUNITY): Payer: Self-pay | Admitting: Emergency Medicine

## 2021-08-13 ENCOUNTER — Inpatient Hospital Stay (HOSPITAL_COMMUNITY)
Admission: EM | Admit: 2021-08-13 | Discharge: 2021-08-14 | Disposition: A | Discharge status: Home / Self Care | Payer: Medicaid Other | Attending: Obstetrics and Gynecology | Admitting: Obstetrics and Gynecology

## 2021-08-13 DIAGNOSIS — Z3A30 30 weeks gestation of pregnancy: Secondary | ICD-10-CM | POA: Diagnosis not present

## 2021-08-13 DIAGNOSIS — O368330 Maternal care for abnormalities of the fetal heart rate or rhythm, third trimester, not applicable or unspecified: Secondary | ICD-10-CM | POA: Diagnosis not present

## 2021-08-13 DIAGNOSIS — O4703 False labor before 37 completed weeks of gestation, third trimester: Secondary | ICD-10-CM | POA: Diagnosis not present

## 2021-08-13 DIAGNOSIS — O26893 Other specified pregnancy related conditions, third trimester: Secondary | ICD-10-CM | POA: Diagnosis not present

## 2021-08-13 DIAGNOSIS — O4700 False labor before 37 completed weeks of gestation, unspecified trimester: Secondary | ICD-10-CM

## 2021-08-13 DIAGNOSIS — O368390 Maternal care for abnormalities of the fetal heart rate or rhythm, unspecified trimester, not applicable or unspecified: Secondary | ICD-10-CM

## 2021-08-13 DIAGNOSIS — R1030 Lower abdominal pain, unspecified: Secondary | ICD-10-CM | POA: Diagnosis not present

## 2021-08-13 DIAGNOSIS — Z3A32 32 weeks gestation of pregnancy: Secondary | ICD-10-CM | POA: Diagnosis not present

## 2021-08-13 DIAGNOSIS — Z3689 Encounter for other specified antenatal screening: Secondary | ICD-10-CM

## 2021-08-13 LAB — URINALYSIS, ROUTINE W REFLEX MICROSCOPIC
Bacteria, UA: NONE SEEN
Bilirubin Urine: NEGATIVE
Glucose, UA: NEGATIVE mg/dL
Ketones, ur: NEGATIVE mg/dL
Nitrite: NEGATIVE
Protein, ur: NEGATIVE mg/dL
Specific Gravity, Urine: 1.013 (ref 1.005–1.030)
pH: 6 (ref 5.0–8.0)

## 2021-08-13 MED ORDER — BETAMETHASONE SOD PHOS & ACET 6 (3-3) MG/ML IJ SUSP
12.0000 mg | Freq: Once | INTRAMUSCULAR | Status: AC
  Administered 2021-08-13: 12 mg via INTRAMUSCULAR
  Filled 2021-08-13: qty 5

## 2021-08-13 NOTE — ED Provider Notes (Signed)
[facility] EMERGENCY DEPARTMENT Provider Note   CSN: [id number] Arrival date & time: 08/13/21  2024     History Chief Complaint  Patient presents with   Pregnant, Vaginal Bleeding    Catherine Nixon is a 20 y.o. female.  HPI  This patient is a 20 year old female, she is G2 P1 at approximately [redacted] weeks gestation, states that her pregnancy has been normal up to this point and is followed by a OB/GYN in [place].  She reports that while she was in the shower today she noticed a small amount of blood and felt like she was urinating on her self but was not.  She has had some minor pelvic pain bilateral lower pelvis but no nausea or vomiting, the abdominal pain is minimal, she has no fevers or chills, no coughing or shortness of breath, no back pain or swelling.  Symptoms are intermittent.  Past Medical History:  Diagnosis Date   Abdominal pain, epigastric 09/15/2020   Medical history non-contributory    Post term pregnancy at [redacted] weeks gestation 10/12/2017   Vaginal delivery 10/13/2017    Patient Active Problem List   Diagnosis Date Noted   Supervision of other normal pregnancy, antepartum 04/18/2021   GBS bacteriuria 04/07/2021    Past Surgical History:  Procedure Laterality Date   CHOLECYSTECTOMY N/A 11/15/2020   Procedure: LAPAROSCOPIC CHOLECYSTECTOMY;  Surgeon: Axel Filler, MD;  Location: [facility] OR;  Service: General;  Laterality: N/A;     OB History     Gravida  2   Para  1   Term  1   Preterm      AB      Living  1      SAB      IAB      Ectopic      Multiple  0   Live Births  1           Family History  Problem Relation Age of Onset   Colon cancer Mother    Diabetes Father    Asthma Neg Hx    Heart disease Neg Hx    Hypertension Neg Hx    Stroke Neg Hx    Esophageal cancer Neg Hx    Pancreatic cancer Neg Hx    Liver disease Neg Hx     Social History   Tobacco Use   Smoking status: Never    Passive exposure: Never   Smokeless  tobacco: Never  Vaping Use   Vaping Use: Never used  Substance Use Topics   Alcohol use: Never   Drug use: Never    Home Medications Prior to Admission medications   Medication Sig Start Date End Date Taking? Authorizing Provider  Blood Pressure Monitoring (BLOOD PRESSURE MONITOR AUTOMAT) DEVI 1 Device by Does not apply route daily. Automatic blood pressure cuff regular size. To monitor blood pressure regularly at home. ICD-10 code:Z34.90 04/18/21   Raelyn Mora, CNM  Misc. Devices (GOJJI WEIGHT SCALE) MISC 1 Device by Does not apply route daily as needed. To weight self daily as needed at home. ICD-10 code: Z34.90 04/18/21   Raelyn Mora, CNM  Prenatal Vit-Fe Fumarate-FA (PRENATAL COMPLETE) 14-0.4 MG TABS Take 1 tablet by mouth daily. 03/20/21   Lurline Idol, FNP    Allergies    Patient has no known allergies.  Review of Systems   Review of Systems  All other systems reviewed and are negative.  Physical Exam Updated Vital Signs BP 105/64 (BP Location: Right Arm)   Pulse Marland Kitchen)  112   Temp 97.9 F (36.6 C) (Oral)   Resp 20   Ht 1.499 m (4\' 11" )   Wt 61.9 kg   LMP 01/10/2021 (Exact Date)   SpO2 100%   BMI 27.55 kg/m   Physical Exam Vitals and nursing note reviewed.  Constitutional:      General: She is not in acute distress.    Appearance: She is well-developed.  HENT:     Head: Normocephalic and atraumatic.     Mouth/Throat:     Pharynx: No oropharyngeal exudate.  Eyes:     General: No scleral icterus.       Right eye: No discharge.        Left eye: No discharge.     Conjunctiva/sclera: Conjunctivae normal.     Pupils: Pupils are equal, round, and reactive to light.  Neck:     Thyroid: No thyromegaly.     Vascular: No JVD.  Cardiovascular:     Rate and Rhythm: Normal rate and regular rhythm.     Heart sounds: Normal heart sounds. No murmur heard.   No friction rub. No gallop.  Pulmonary:     Effort: Pulmonary effort is normal. No respiratory distress.      Breath sounds: Normal breath sounds. No wheezing or rales.  Abdominal:     General: Bowel sounds are normal. There is no distension.     Palpations: Abdomen is soft. There is no mass.     Tenderness: There is no abdominal tenderness.     Comments: Uterus is above the level of the umbilicus, nontender, positive fetal movements.  Genitourinary:    Comments: Genitourinary exam shows 1cm dilated - has no sig fluid in vaginal vault. Musculoskeletal:        General: No tenderness. Normal range of motion.     Cervical back: Normal range of motion and neck supple.  Lymphadenopathy:     Cervical: No cervical adenopathy.  Skin:    General: Skin is warm and dry.     Findings: No erythema or rash.  Neurological:     Mental Status: She is alert.     Coordination: Coordination normal.  Psychiatric:        Behavior: Behavior normal.    ED Results / Procedures / Treatments   Labs (all labs ordered are listed, but only abnormal results are displayed) Labs Reviewed  URINALYSIS, ROUTINE W REFLEX MICROSCOPIC - Abnormal; Notable for the following components:      Result Value   Hgb urine dipstick SMALL (*)    Leukocytes,Ua MODERATE (*)    All other components within normal limits    EKG None  Radiology No results found.  Procedures Procedures   Medications Ordered in ED Medications  betamethasone acetate-betamethasone sodium phosphate (CELESTONE) injection 12 mg (has no administration in time range)    ED Course  I have reviewed the triage vital signs and the nursing notes.  Pertinent labs & imaging results that were available during my care of the patient were reviewed by me and considered in my medical decision making (see chart for details).    MDM Rules/Calculators/A&P                           Pt placed on toco UA pending No pH paper in the ED Dr. 03/12/2021 will come to see pt and do speculum exam.  Pt has no ferning - 1cm dilated for Dr. Adrian Blackwater Accepted at [facility] for  evaluation Fluid bolus given. Betamethasone will be ordered  Final Clinical Impression(s) / ED Diagnoses Final diagnoses:  Preterm contractions    Rx / DC Orders ED Discharge Orders     None        Eber Hong, MD 08/13/21 2140

## 2021-08-13 NOTE — ED Triage Notes (Signed)
Pt is currently 7 months pregnant with 2nd baby. Pt began having lower abd pain with some vaginal bleeding this afternoon after taking shower. Pt denies any problems with pregnancy.

## 2021-08-13 NOTE — Consult Note (Addendum)
OB Consult note    S [name] is a 21 y.o. G2P1001 patient at 30w gestation who presents to [facility] ED with concerns of ruptured membranes. Having some cramping and contractions. Good fetal movement. No recent sex.   I was asked to see and evaluate the patient by Dr Hyacinth Meeker.  O BP 105/64 (BP Location: Right Arm)   Pulse (!) 112   Temp 97.9 F (36.6 C) (Oral)   Resp 20   Ht 4\' 11"  (1.499 m)   Wt 61.9 kg   LMP 01/10/2021 (Exact Date)   SpO2 100%   BMI 27.55 kg/m  Physical Exam Vitals reviewed. Exam conducted with a chaperone present.  Constitutional:      Appearance: Normal appearance.  Cardiovascular:     Rate and Rhythm: Normal rate and regular rhythm.  Pulmonary:     Effort: Pulmonary effort is normal.     Breath sounds: Normal breath sounds.  Abdominal:     General: Abdomen is flat.     Hernia: There is no hernia in the left inguinal area or right inguinal area.  Genitourinary:    Labia:        Right: No rash or tenderness.        Left: No rash or tenderness.      Comments: No pooling, no bleeding.  Lymphadenopathy:     Lower Body: No right inguinal adenopathy. No left inguinal adenopathy.  Skin:    General: Skin is warm and dry.     Capillary Refill: Capillary refill takes less than 2 seconds.  Neurological:     General: No focal deficit present.     Mental Status: She is alert.  Psychiatric:        Mood and Affect: Mood normal.        Behavior: Behavior normal.        Thought Content: Thought content normal.        Judgment: Judgment normal.   Cervix: 1/thick/ballotable   A Threatened preterm labor.   P Start IVF BMZ 12mg  Category 1 tracing with irregular contractions.  I called [facility] at [facility] - will send the patient there for further evaluation.  , DO 08/13/2021 9:52 PM

## 2021-08-13 NOTE — ED Notes (Signed)
Report given to [facility] at this time.   

## 2021-08-14 ENCOUNTER — Encounter (HOSPITAL_COMMUNITY): Payer: Self-pay | Admitting: Obstetrics and Gynecology

## 2021-08-14 ENCOUNTER — Inpatient Hospital Stay (EMERGENCY_DEPARTMENT_HOSPITAL)
Admission: AD | Admit: 2021-08-14 | Discharge: 2021-08-14 | Disposition: A | Discharge status: Home / Self Care | Payer: Medicaid Other | Source: Home / Self Care | Attending: Obstetrics and Gynecology | Admitting: Obstetrics and Gynecology

## 2021-08-14 ENCOUNTER — Other Ambulatory Visit: Payer: Self-pay

## 2021-08-14 ENCOUNTER — Inpatient Hospital Stay (HOSPITAL_BASED_OUTPATIENT_CLINIC_OR_DEPARTMENT_OTHER): Payer: Medicaid Other

## 2021-08-14 DIAGNOSIS — Z3A30 30 weeks gestation of pregnancy: Secondary | ICD-10-CM

## 2021-08-14 DIAGNOSIS — O4703 False labor before 37 completed weeks of gestation, third trimester: Secondary | ICD-10-CM

## 2021-08-14 DIAGNOSIS — O368330 Maternal care for abnormalities of the fetal heart rate or rhythm, third trimester, not applicable or unspecified: Secondary | ICD-10-CM

## 2021-08-14 LAB — CERVICOVAGINAL ANCILLARY ONLY
Bacterial Vaginitis (gardnerella): POSITIVE — AB
Candida Glabrata: NEGATIVE
Candida Vaginitis: POSITIVE — AB
Chlamydia: NEGATIVE
Comment: NEGATIVE
Comment: NEGATIVE
Comment: NEGATIVE
Comment: NEGATIVE
Comment: NEGATIVE
Comment: NORMAL
Neisseria Gonorrhea: NEGATIVE
Trichomonas: NEGATIVE

## 2021-08-14 MED ORDER — BETAMETHASONE SOD PHOS & ACET 6 (3-3) MG/ML IJ SUSP
12.0000 mg | Freq: Once | INTRAMUSCULAR | Status: AC
  Administered 2021-08-14: 12 mg via INTRAMUSCULAR
  Filled 2021-08-14: qty 5

## 2021-08-14 MED ORDER — LACTATED RINGERS IV BOLUS
1000.0000 mL | Freq: Once | INTRAVENOUS | Status: AC
  Administered 2021-08-14: 1000 mL via INTRAVENOUS

## 2021-08-14 MED ORDER — TERBUTALINE SULFATE 1 MG/ML IJ SOLN
0.2500 mg | Freq: Once | INTRAMUSCULAR | Status: AC
  Administered 2021-08-14: 0.25 mg via SUBCUTANEOUS
  Filled 2021-08-14: qty 1

## 2021-08-14 NOTE — MAU Provider Note (Signed)
History     CSN: [id number]  Arrival date and time: 08/13/21 2024   Event Date/Time   First Provider Initiated Contact with Patient 08/14/21 0120      Chief Complaint  Patient presents with   Pregnant, Vaginal Bleeding   [name] is a 20 y.o. G2P1001 at [redacted]w[redacted]d who presents to MAU for evaluation of preterm labor. Patient was seen at [facility] ED earlier this evening for suspected ROM and contractions. Per report from Dr. Joyice Faster, patient was not found to be ruptured on speculum exam and without bleeding and was checked and cervix was found to be 1/long. Because patient was still contracting, patient was given BMZ and transferred to MAU for further evaluation, arriving about 4 hours after initial cervical exam without any additional exams in between. Pstient was also given IV fluids at [facility] ED. Patient reports good FM. Patient denies recent intercourse.  On arrival to MAU, pt reports worsening contractions, though still rates contractions as a 3/10, the same rating she gave over at [facility] earlier. Discussed with patient that rating was the same, but patient continues to insist that contractions are worse at this time.  Pt denies VB, LOF, decreased FM, vaginal discharge/odor/itching. Pt denies N/V, abdominal pain, constipation, diarrhea, or urinary problems. Pt denies fever, chills, fatigue, sweating or changes in appetite. Pt denies SOB or chest pain. Pt denies dizziness, HA, light-headedness, weakness.  Allergies? NKDA Prenatal care provider? [facility]   OB History     Gravida  2   Para  1   Term  1   Preterm      AB      Living  1      SAB      IAB      Ectopic      Multiple  0   Live Births  1           Past Medical History:  Diagnosis Date   Abdominal pain, epigastric 09/15/2020   Medical history non-contributory    Post term pregnancy at [redacted] weeks gestation 10/12/2017   Vaginal delivery 10/13/2017    Past Surgical History:   Procedure Laterality Date   CHOLECYSTECTOMY N/A 11/15/2020   Procedure: LAPAROSCOPIC CHOLECYSTECTOMY;  Surgeon: Ralene Ok, MD;  Location: [facility] OR;  Service: General;  Laterality: N/A;    Family History  Problem Relation Age of Onset   Colon cancer Mother    Diabetes Father    Asthma Neg Hx    Heart disease Neg Hx    Hypertension Neg Hx    Stroke Neg Hx    Esophageal cancer Neg Hx    Pancreatic cancer Neg Hx    Liver disease Neg Hx     Social History   Tobacco Use   Smoking status: Never    Passive exposure: Never   Smokeless tobacco: Never  Vaping Use   Vaping Use: Never used  Substance Use Topics   Alcohol use: Never   Drug use: Never    Allergies: No Known Allergies  Medications Prior to Admission  Medication Sig Dispense Refill Last Dose   Blood Pressure Monitoring (BLOOD PRESSURE MONITOR AUTOMAT) DEVI 1 Device by Does not apply route daily. Automatic blood pressure cuff regular size. To monitor blood pressure regularly at home. ICD-10 code:Z34.90 1 each 0    Misc. Devices (GOJJI WEIGHT SCALE) MISC 1 Device by Does not apply route daily as needed. To weight self daily as needed at home. ICD-10 code:  Z34.90 1 each 0    Prenatal Vit-Fe Fumarate-FA (PRENATAL COMPLETE) 14-0.4 MG TABS Take 1 tablet by mouth daily. 60 tablet 0     Review of Systems  Constitutional:  Negative for chills, diaphoresis, fatigue and fever.  Eyes:  Negative for visual disturbance.  Respiratory:  Negative for shortness of breath.   Cardiovascular:  Negative for chest pain.  Gastrointestinal:  Negative for abdominal pain, constipation, diarrhea, nausea and vomiting.  Genitourinary:  Negative for dysuria, flank pain, frequency, pelvic pain, urgency, vaginal bleeding and vaginal discharge.       Contractions  Neurological:  Negative for dizziness, weakness, light-headedness and headaches.   Physical Exam   Blood pressure (!) 92/40, pulse 92, temperature 98 F (36.7 C), temperature  source Oral, resp. rate 18, height $RemoveBefor [id number]$4\' 11"01/10/2021, SpO2 99 %.  Patient Vitals for the past 24 hrs:  BP Temp Temp src Pulse Resp SpO2 Height Weight  08/14/21 0811 (!) 92/40 -- -- 92 -- -- -- --  08/14/21 0454 (!) 102/48 98 F (36.7 C) Oral 93 18 99 % -- --  08/14/21 0051 (!) 106/57 -- -- 84 -- -- -- --  08/14/21 0007 101/61 97.8 F (36.6 C) Oral 94 18 100 % -- --  08/13/21 2213 (!) 90/58 98.2 F (36.8 C) Oral 86 18 100 % -- --  08/13/21 2034 -- -- -- -- -- -- 4\' 11"  (1.499 m) 61.9 kg  08/13/21 2033 105/64 97.9 F (36.6 C) Oral (!) 112 20 100 % -- --   Physical Exam Vitals and nursing note reviewed. Exam conducted with a chaperone present.  Constitutional:      General: She is not in acute distress.    Appearance: Normal appearance. She is not ill-appearing, toxic-appearing or diaphoretic.  HENT:     Head: Normocephalic and atraumatic.  Pulmonary:     Effort: Pulmonary effort is normal.  Abdominal:     Palpations: Abdomen is soft.  Genitourinary:    Labia:        Right: No rash, tenderness or lesion.        Left: No rash, tenderness or lesion.   Skin:    General: Skin is warm and dry.  Neurological:     Mental Status: She is alert and oriented to person, place, and time.  Psychiatric:        Mood and Affect: Mood normal.        Behavior: Behavior normal.        Thought Content: Thought content normal.        Judgment: Judgment normal.   Results for orders placed or performed during the hospital encounter of 08/13/21 (from the past 24 hour(s))  Urinalysis, Routine w reflex microscopic Urine, Clean Catch     Status: Abnormal   Collection Time: 08/13/21  9:02 PM  Result Value Ref Range   Color, Urine YELLOW YELLOW   APPearance CLEAR CLEAR   Specific Gravity, Urine 1.013 1.005 - 1.030   pH 6.0 5.0 - 8.0   Glucose, UA NEGATIVE NEGATIVE mg/dL   Hgb urine dipstick SMALL (A) NEGATIVE   Bilirubin Urine NEGATIVE NEGATIVE    Ketones, ur NEGATIVE NEGATIVE mg/dL   Protein, ur NEGATIVE NEGATIVE mg/dL   Nitrite NEGATIVE NEGATIVE   Leukocytes,Ua MODERATE (A) NEGATIVE   RBC / HPF 0-5 0 - 5 RBC/hpf   WBC, UA 21-50 0 - 5 WBC/hpf   Bacteria, UA NONE SEEN NONE SEEN  Squamous Epithelial / LPF 0-5 0 - 5   WBC Clumps PRESENT    No results found.  MAU Course  Procedures  MDM -cervix checked on arrival to MAU, 4 hours after initial exam, 1/50/posterior -consulted with Dr. Alysia Penna who agrees with plan for terbutaline and recommends additional bag of fluid -cervix rechecked 3 hours later 1.5/posterior/softer than previous exam EFM: reactive       -baseline: 115       -variability: moderate       -accels: present, 15x15       -decels: absent       -TOCO: irritability/ctx -after interventions, patient continues to rate pain as 2/10 and reports improvement, but also states that what she is feeling is more pressure than pain, which is intermittent -consulted again with Dr. Alysia Penna who recommends, 10mg  morphine IM, 12.5mg  IM phenergan and discharge home. Patient reports she has not used opioids before and due to small stature, will give 5mg  morphine at this time. -upon entering room to discharge patient, fetal HR noted to be in 100s, with ~19min fetal HR deceleration to 60s with return to baseline. Fetal deceleration also happened on arrival to MAU that was approximately , but no additional decelerations over the course of the tracing until time of considered discharge. DR. 0m called, no additional intervention required at this time, can continue to monitor patient in MAU. BPP ordered -BPP: 6/8 (-2 for breathing, sonographer did notice 2 decels during scan into 80s/upper 90s) -EFM: reactive       -baseline: 120       -variability: moderate       -accels: present, 15x15       -decels: absent       -TOCO: irritability -reviewed tracing and patient history with Dr. Alysia Penna, pt OK to be discharged home -pt discharged to  home in stable condition  Orders Placed This Encounter  Procedures   Culture, OB Urine    Standing Status:   Standing    Number of Occurrences:   1   8/8 MFM FETAL BPP WO NON STRESS    Standing Status:   Standing    Number of Occurrences:   1    Order Specific Question:   Symptom/Reason for Exam    Answer:   Fetal heart rate decelerations affecting management of mother Adrian [place]   Urinalysis, Routine w reflex microscopic    Standing Status:   Standing    Number of Occurrences:   1   Continuous tocometry    Standing Status:   Standing    Number of Occurrences:   1   Saline lock IV    Standing Status:   Standing    Number of Occurrences:   1   Discharge patient    Order Specific Question:   Discharge disposition    Answer:   01-Home or Self Care [1]    Order Specific Question:   Discharge patient date    Answer:   08/14/2021   Meds ordered this encounter  Medications   betamethasone acetate-betamethasone sodium phosphate (CELESTONE) injection 12 mg   terbutaline (BRETHINE) injection 0.25 mg   lactated ringers bolus 1,000 mL   Assessment and Plan   1. Preterm contractions   2. Fetal heart rate decelerations affecting management of mother   3. [redacted] weeks gestation of pregnancy   4. NST (non-stress test) reactive    Allergies as of 08/14/2021   No Known Allergies      Medication List  TAKE these medications    Blood Pressure Monitor Automat Devi 1 Device by Does not apply route daily. Automatic blood pressure cuff regular size. To monitor blood pressure regularly at home. ICD-10 code:Z34.90   Gojji Weight Scale Misc 1 Device by Does not apply route daily as needed. To weight self daily as needed at home. ICD-10 code: Z34.90   Prenatal Complete 14-0.4 MG Tabs Take 1 tablet by mouth daily.       -message sent to [facility] to schedule for visit this week -BMZ tonight in MAU around 930PM -fetal kick counts discussed -return MAU precautions given -pt discharged  to home in stable condition   Elmyra Ricks E Lord Lancour 08/14/2021, 8:15 AM

## 2021-08-14 NOTE — MAU Note (Signed)
..  Catherine Nixon is a 20 y.o. at [redacted]w[redacted]d here in MAU reporting: For her betamethasone injection. Denies contractions. Reports she has had a headache that began around 73, has not taken anything for the pain. States that last week she had this pain and she took tylenol, which helped.  +FM. Denies vaginal bleeding or leaking of fluid.  Pain score: 6/10 Vitals:   08/14/21 2148  BP: (!) 104/55  Pulse: (!) 105  Resp: 16  Temp: 98.3 F (36.8 C)  SpO2: 100%     FHT: 130 Lab orders placed from triage:

## 2021-08-14 NOTE — MAU Note (Signed)
Pt arrived via [facility] from [facility]. C/O vaginal bleeding. Given BMZ at [facility].  Pain is 3/10.

## 2021-08-14 NOTE — MAU Provider Note (Signed)
History Catherine Nixon is a 20 y.o. G2P1001 at [redacted]w[redacted]d who presents for second dose of betamethasone.  Denies contractions, leaking of fluid, or vaginal bleeding.  Reports good fetal.  Physical exam BP (!) 104/55 (BP Location: Right Arm)   Pulse (!) 105   Temp 98.3 F (36.8 C) (Oral)   Resp 16   Ht 4\' 11"  (1.499 m)   Wt 63.5 kg   LMP 01/10/2021 (Exact Date)   SpO2 100%   BMI 28.28 kg/m   Physical Examination: General appearance - alert, well appearing, and in no distress Mental status - normal mood, behavior, speech, dress, motor activity, and thought processes Eyes - sclera anicteric Chest - normal respiratory effort   Assessment/Plan 1. Preterm uterine contractions in third trimester, antepartum   2. [redacted] weeks gestation of pregnancy    -Given injection without issue.  She is asymptomatic.  Stable for discharge.  Instructed to keep follow-up appointment and reviewed reasons to return to MAU    03/12/2021, NP

## 2021-08-15 ENCOUNTER — Other Ambulatory Visit: Payer: Self-pay | Admitting: *Deleted

## 2021-08-15 DIAGNOSIS — B9689 Other specified bacterial agents as the cause of diseases classified elsewhere: Secondary | ICD-10-CM

## 2021-08-15 DIAGNOSIS — B3731 Acute candidiasis of vulva and vagina: Secondary | ICD-10-CM

## 2021-08-15 DIAGNOSIS — O26899 Other specified pregnancy related conditions, unspecified trimester: Secondary | ICD-10-CM

## 2021-08-15 DIAGNOSIS — N898 Other specified noninflammatory disorders of vagina: Secondary | ICD-10-CM

## 2021-08-15 DIAGNOSIS — N760 Acute vaginitis: Secondary | ICD-10-CM

## 2021-08-15 MED ORDER — METRONIDAZOLE 500 MG PO TABS: 500.0000 mg | ORAL_TABLET | Freq: Two times a day (BID) | ORAL | 0 refills | Status: DC

## 2021-08-15 MED ORDER — TERCONAZOLE 0.4 % VA CREA: 1.0000 | TOPICAL_CREAM | Freq: Every day | VAGINAL | 0 refills | Status: DC

## 2021-08-17 ENCOUNTER — Other Ambulatory Visit: Payer: Self-pay

## 2021-08-17 ENCOUNTER — Ambulatory Visit (INDEPENDENT_AMBULATORY_CARE_PROVIDER_SITE_OTHER): Payer: Medicaid Other | Admitting: Obstetrics and Gynecology

## 2021-08-17 VITALS — BP 95/54 | HR 86 | Temp 98.0°F | Wt 137.6 lb

## 2021-08-17 DIAGNOSIS — O26899 Other specified pregnancy related conditions, unspecified trimester: Secondary | ICD-10-CM

## 2021-08-17 DIAGNOSIS — H9319 Tinnitus, unspecified ear: Secondary | ICD-10-CM

## 2021-08-17 DIAGNOSIS — R519 Headache, unspecified: Secondary | ICD-10-CM

## 2021-08-17 DIAGNOSIS — R42 Dizziness and giddiness: Secondary | ICD-10-CM

## 2021-08-17 DIAGNOSIS — Z3A31 31 weeks gestation of pregnancy: Secondary | ICD-10-CM | POA: Diagnosis not present

## 2021-08-17 DIAGNOSIS — Z3480 Encounter for supervision of other normal pregnancy, unspecified trimester: Secondary | ICD-10-CM | POA: Diagnosis not present

## 2021-08-17 DIAGNOSIS — O4703 False labor before 37 completed weeks of gestation, third trimester: Secondary | ICD-10-CM | POA: Insufficient documentation

## 2021-08-17 DIAGNOSIS — R5383 Other fatigue: Secondary | ICD-10-CM | POA: Diagnosis not present

## 2021-08-17 DIAGNOSIS — R110 Nausea: Secondary | ICD-10-CM

## 2021-08-17 MED ORDER — ONDANSETRON 4 MG PO TBDP: 4.0000 mg | ORAL_TABLET | Freq: Three times a day (TID) | ORAL | 2 refills | Status: DC | PRN

## 2021-08-17 MED ORDER — CYCLOBENZAPRINE HCL 10 MG PO TABS
10.0000 mg | ORAL_TABLET | Freq: Three times a day (TID) | ORAL | 2 refills | Status: DC | PRN
Start: 2021-08-17 — End: 2021-09-13

## 2021-08-18 LAB — COMPREHENSIVE METABOLIC PANEL
ALT: 23 IU/L (ref 0–32)
AST: 14 IU/L (ref 0–40)
Albumin/Globulin Ratio: 1.5 (ref 1.2–2.2)
Albumin: 3.5 g/dL — ABNORMAL LOW (ref 3.9–5.0)
Alkaline Phosphatase: 138 IU/L — ABNORMAL HIGH (ref 44–121)
BUN/Creatinine Ratio: 12 (ref 9–23)
BUN: 7 mg/dL (ref 6–20)
Bilirubin Total: <0.2 mg/dL (ref 0.0–1.2)
CO2: 23 mmol/L (ref 20–29)
Calcium: 8.5 mg/dL — ABNORMAL LOW (ref 8.7–10.2)
Chloride: 103 mmol/L (ref 96–106)
Creatinine, Ser: 0.6 mg/dL (ref 0.57–1.00)
Globulin, Total: 2.3 g/dL (ref 1.5–4.5)
Glucose: 91 mg/dL (ref 70–99)
Potassium: 3.4 mmol/L — ABNORMAL LOW (ref 3.5–5.2)
Sodium: 138 mmol/L (ref 134–144)
Total Protein: 5.8 g/dL — ABNORMAL LOW (ref 6.0–8.5)
eGFR: 130 mL/min/{1.73_m2} (ref >59)

## 2021-08-18 LAB — CBC
Hematocrit: 29.2 % — ABNORMAL LOW (ref 34.0–46.6)
Hemoglobin: 9.6 g/dL — ABNORMAL LOW (ref 11.1–15.9)
MCH: 27 pg (ref 26.6–33.0)
MCHC: 32.9 g/dL (ref 31.5–35.7)
MCV: 82 fL (ref 79–97)
Platelets: 320 10*3/uL (ref 150–450)
RBC: 3.55 x10E6/uL — ABNORMAL LOW (ref 3.77–5.28)
RDW: 12 % (ref 11.7–15.4)
WBC: 10.3 10*3/uL (ref 3.4–10.8)

## 2021-08-18 LAB — TSH: TSH: 0.988 u[IU]/mL (ref 0.450–4.500)

## 2021-08-18 LAB — PROTEIN / CREATININE RATIO, URINE
Creatinine, Urine: 55.6 mg/dL
Protein, Ur: 11.2 mg/dL
Protein/Creat Ratio: 201 mg/g creat — ABNORMAL HIGH (ref 0–200)

## 2021-08-19 ENCOUNTER — Encounter: Payer: Self-pay | Admitting: Obstetrics and Gynecology

## 2021-08-19 NOTE — Progress Notes (Signed)
LOW-RISK PREGNANCY OFFICE VISIT Patient name: Catherine Nixon MRN [id number]  Date of birth: Feb 25, 2001 Chief Complaint:   Routine Prenatal Visit  History of Present Illness:   Catherine Nixon is a 20 y.o. G86P1001 female at [redacted]w[redacted]d with an Estimated Date of Delivery: 10/17/21 being seen today for ongoing management of a low-risk pregnancy.  Today she reports irregular contractions, dizziness, h/a's every day, nausea, and ringing in her ear; all for 2 weeks.She received BMZ on 11/6 & 11/7. Contractions: Irregular. Vag. Bleeding: None.  Movement: Present. denies leaking of fluid. Review of Systems:   Pertinent items are noted in HPI Denies abnormal vaginal discharge w/ itching/odor/irritation, headaches, visual changes, shortness of breath, chest pain, abdominal pain, severe nausea/vomiting, or problems with urination or bowel movements unless otherwise stated above. Pertinent History Reviewed:  Reviewed past medical,surgical, social, obstetrical and family history.  Reviewed problem list, medications and allergies. Physical Assessment:   Vitals:   08/17/21 1502  BP: (!) 95/54  Pulse: 86  Temp: 98 F (36.7 C)  Weight: 137 lb 9.6 oz (62.4 kg)  Body mass index is 27.79 kg/m.        Physical Examination:   General appearance: Well appearing, and in no distress  Mental status: Alert, oriented to person, place, and time  Skin: Warm & dry  Cardiovascular: Normal heart rate noted  Respiratory: Normal respiratory effort, no distress  Abdomen: Soft, gravid, nontender  Pelvic: Cervical exam performed  Dilation: 1 Effacement (%): Thick Station: Ballotable  Extremities: Edema: None  Fetal Status: Fetal Heart Rate (bpm): 131 Fundal Height: 30 cm Movement: Present    *Consult with Dr. Ilda Basset @ 1526 - notified of patient's complaints, assessments, lab results, recommended tx plan orthostatic VS, TSH, agree with PEC labs, increase water and salt intake Assessment & Plan:  1) Low-risk pregnancy  G2P1001 at [redacted]w[redacted]d with an Estimated Date of Delivery: 10/17/21   2) Supervision of other normal pregnancy, antepartum   3) [redacted] weeks gestation of pregnancy   4) Pregnancy headache, antepartum  - Protein / creatinine ratio, urine,  - CBC,  - Comp Met (CMET),  - Rx for cyclobenzaprine (FLEXERIL) 10 MG tablet  5) Dizziness, nonspecific  - Protein / creatinine ratio, urine,  - CBC,  - Comp Met (CMET),  - TSH  6) Preterm uterine contractions in third trimester, antepartum  7) Tinnitus, unspecified laterality  - TSH  8) Fatigue, unspecified type  - TSH  9) Nausea  - Rx for ondansetron (ZOFRAN ODT) 4 MG disintegrating tablet    Meds:  Meds ordered this encounter  Medications   cyclobenzaprine (FLEXERIL) 10 MG tablet    Sig: Take 1 tablet (10 mg total) by mouth every 8 (eight) hours as needed for muscle spasms. PRN Headaches    Dispense:  30 tablet    Refill:  2   ondansetron (ZOFRAN ODT) 4 MG disintegrating tablet    Sig: Take 1 tablet (4 mg total) by mouth every 8 (eight) hours as needed for nausea or vomiting.    Dispense:  20 tablet    Refill:  2   Labs/procedures today: PEC labs, TSH, orthostatic VS  Plan:  Continue routine obstetrical care   Reviewed: Preterm labor symptoms and general obstetric precautions including but not limited to vaginal bleeding, contractions, leaking of fluid and fetal movement were reviewed in detail with the patient.  All questions were answered. Has home bp cuff. Check bp weekly, let us know if >140/90.   Follow-up: Return in about  1 week (around 08/24/2021) for Return OB visit.  Orders Placed This Encounter  Procedures   Protein / creatinine ratio, urine   CBC   Comp Met (CMET)   TSH   Laury Deep MSN, CNM 08/17/2021 3:30 PM

## 2021-08-23 ENCOUNTER — Ambulatory Visit: Payer: Medicaid Other

## 2021-08-31 IMAGING — CR DG HIP (WITH OR WITHOUT PELVIS) 2-3V*L*
3 series · 3 of 3 positions shown · non-contrast
Comparison: None.

CLINICAL DATA: Pain status post fall

EXAM:
DG HIP (WITH OR WITHOUT PELVIS) 2-3V LEFT

[pelvis ap]
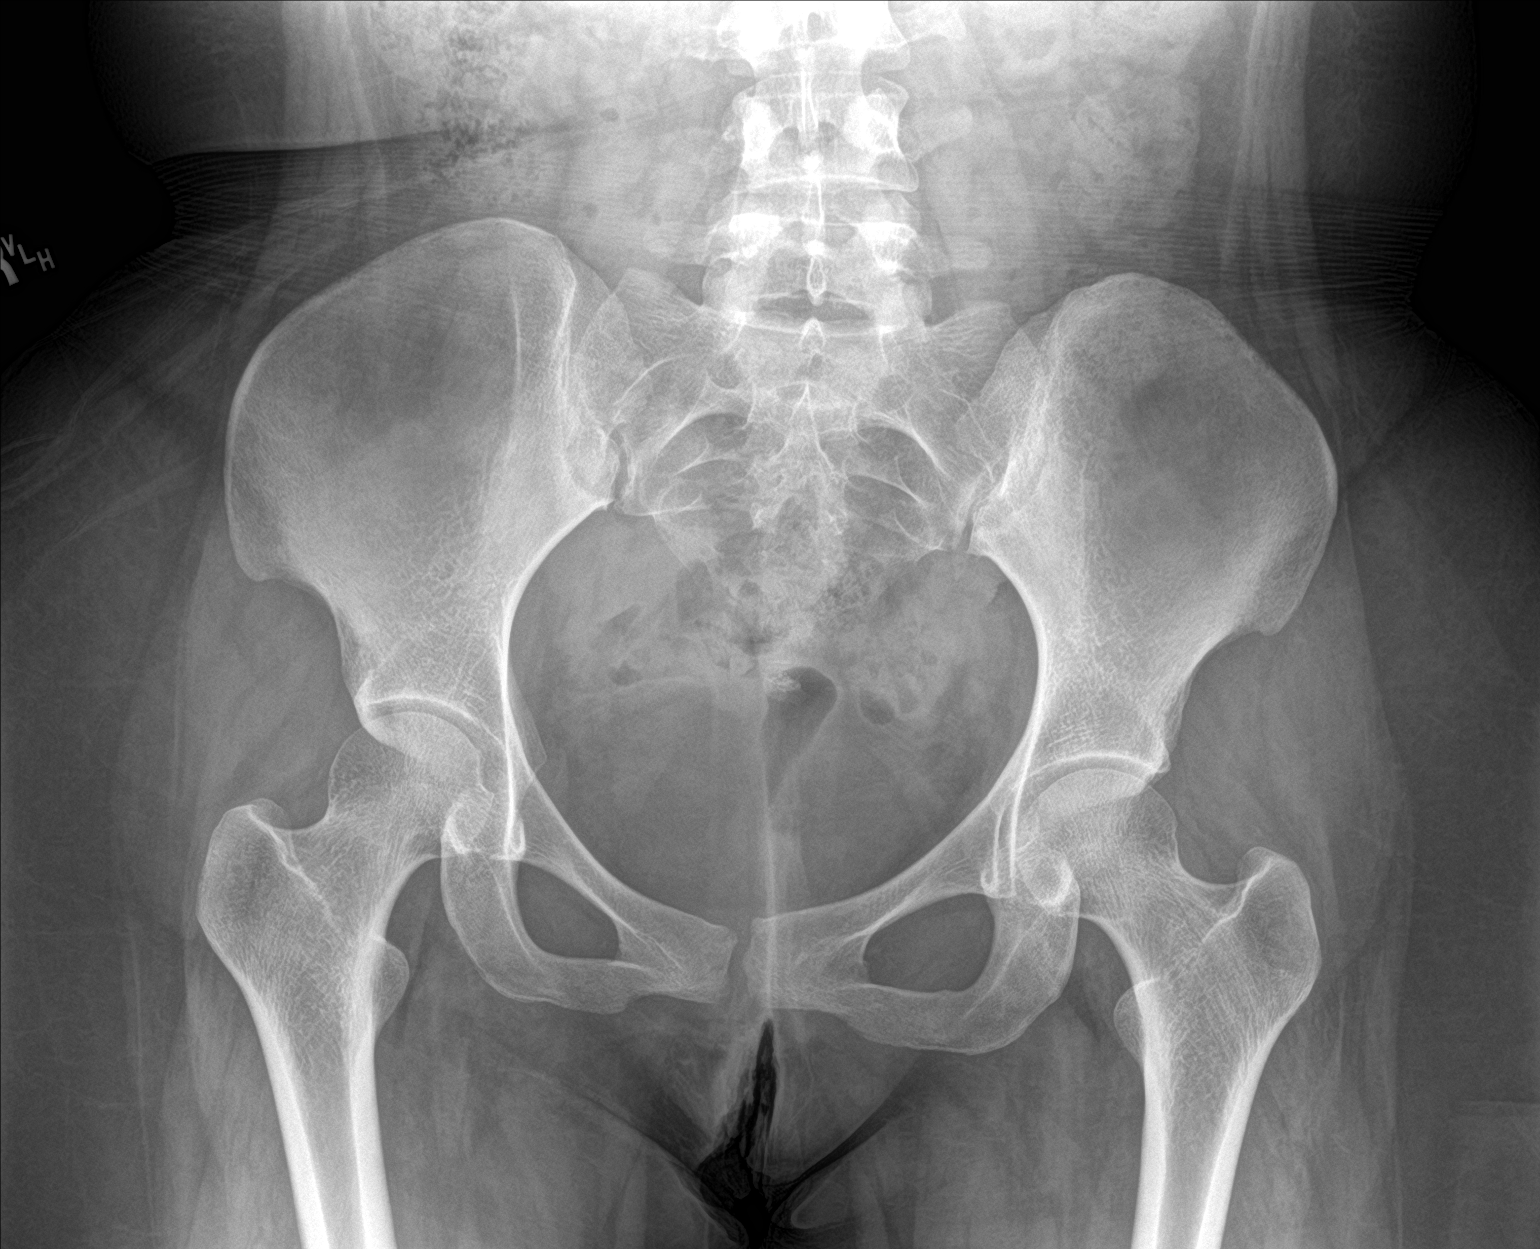

[hip ap]
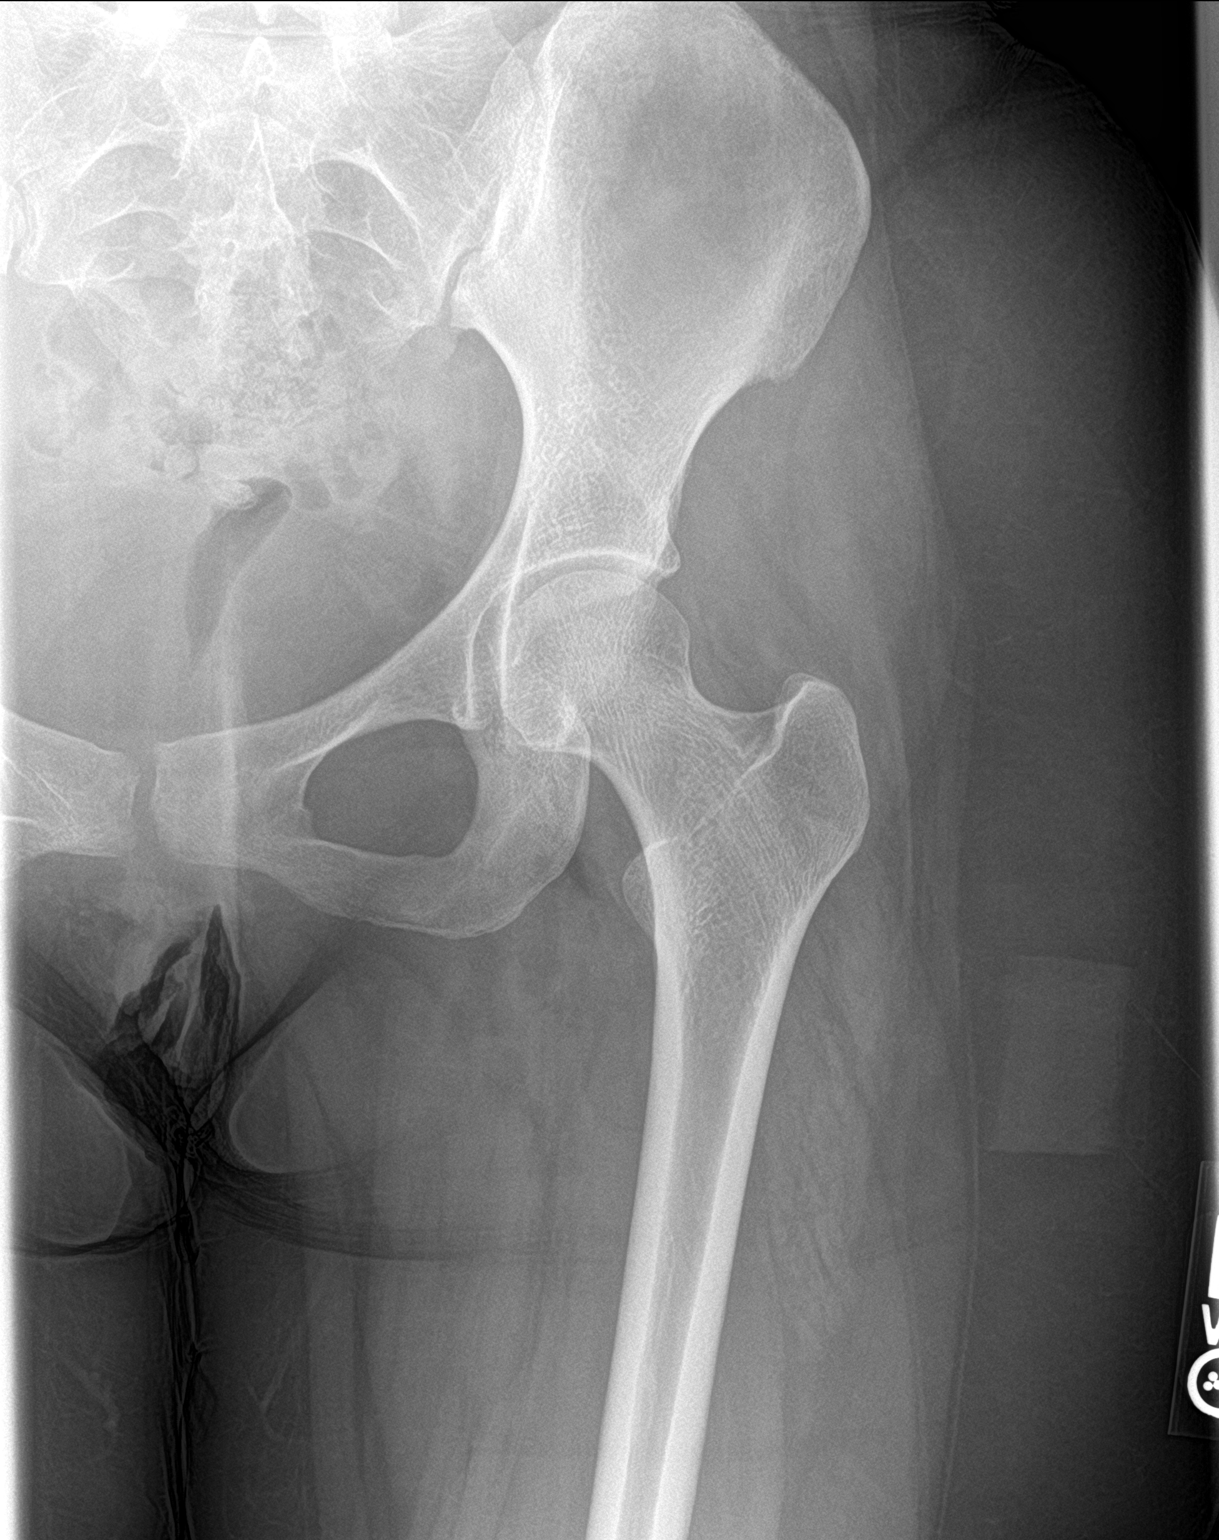

[hip lat]
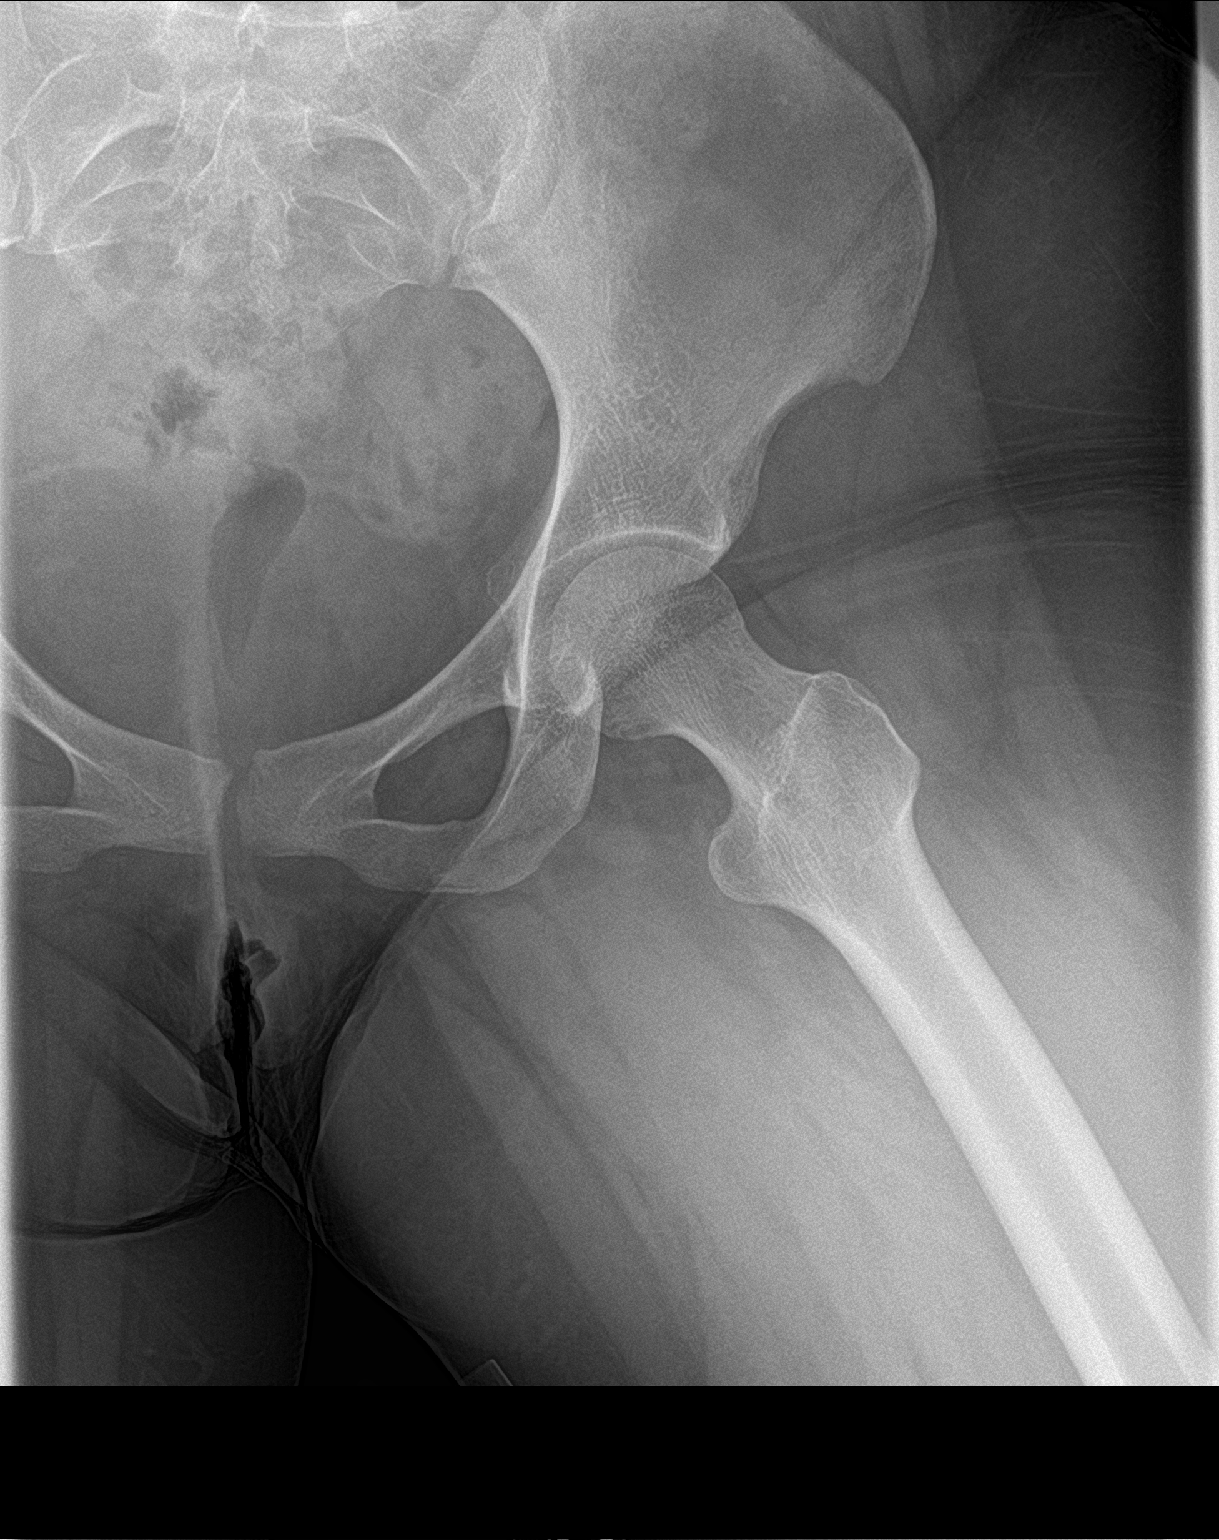

[3 of 3 positions shown; findings below may reference images not displayed]

FINDINGS: There is no evidence of hip fracture or dislocation. There is no
evidence of arthropathy or other focal bone abnormality.
IMPRESSION: Negative.

## 2021-08-31 IMAGING — CR DG TIBIA/FIBULA 2V*R*
4 series · 4 of 4 positions shown · non-contrast
Comparison: None.

CLINICAL DATA: Pain status post fall

EXAM:
RIGHT TIBIA AND FIBULA - 2 VIEW

[tibia ap (1 of 2)]
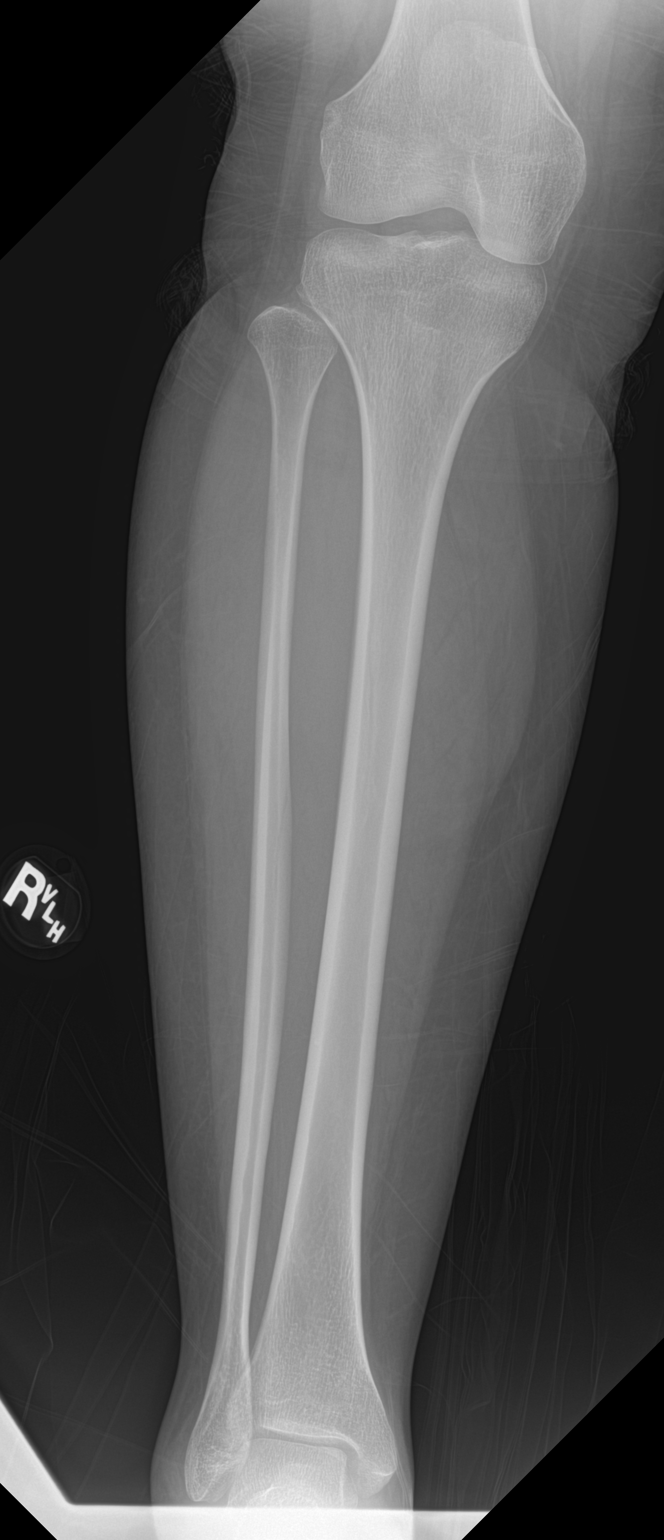

[tibia ap (2 of 2)]
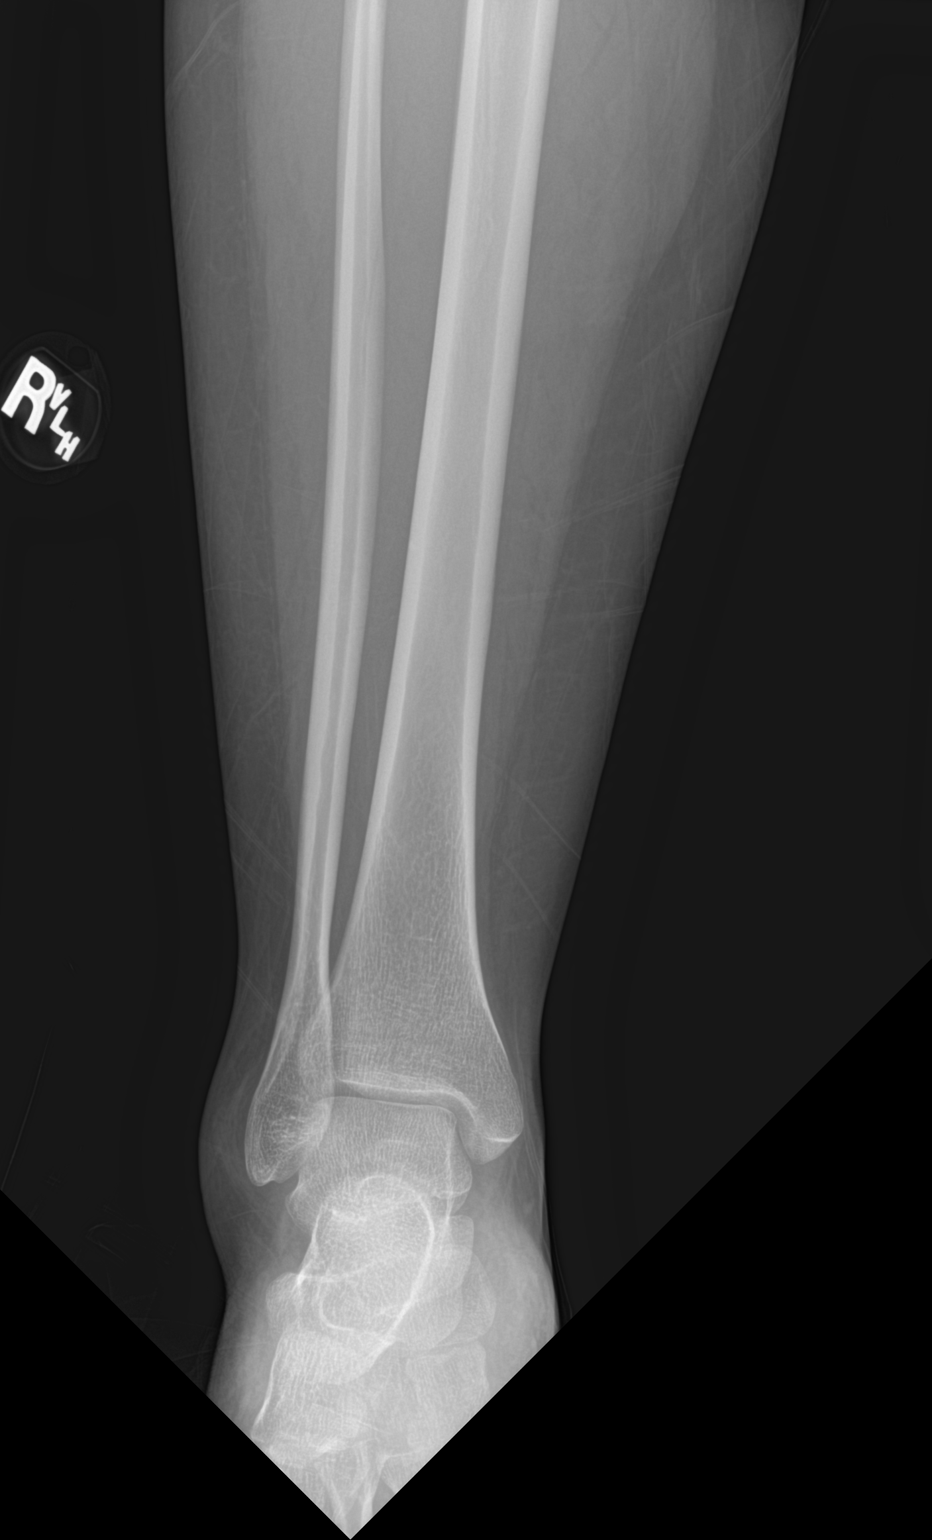

[tibia lat (1 of 2)]
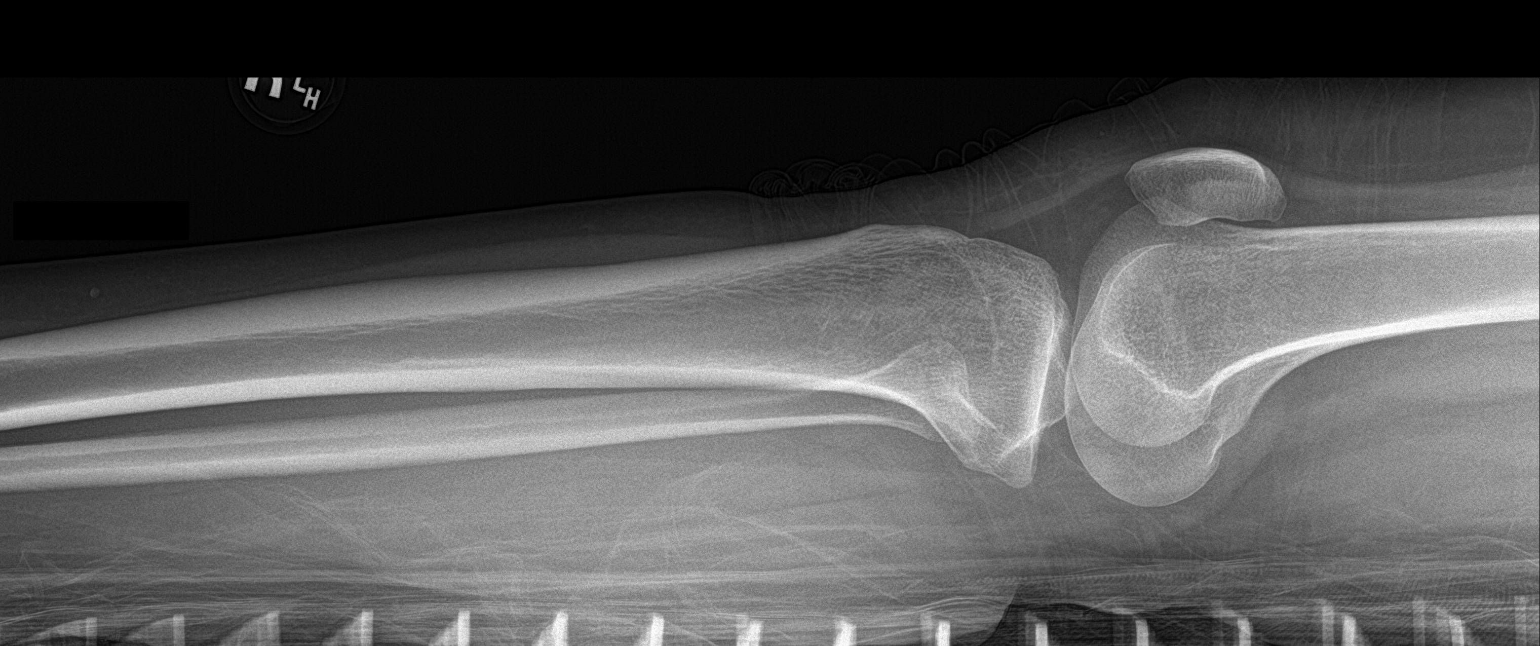

[tibia lat (2 of 2)]
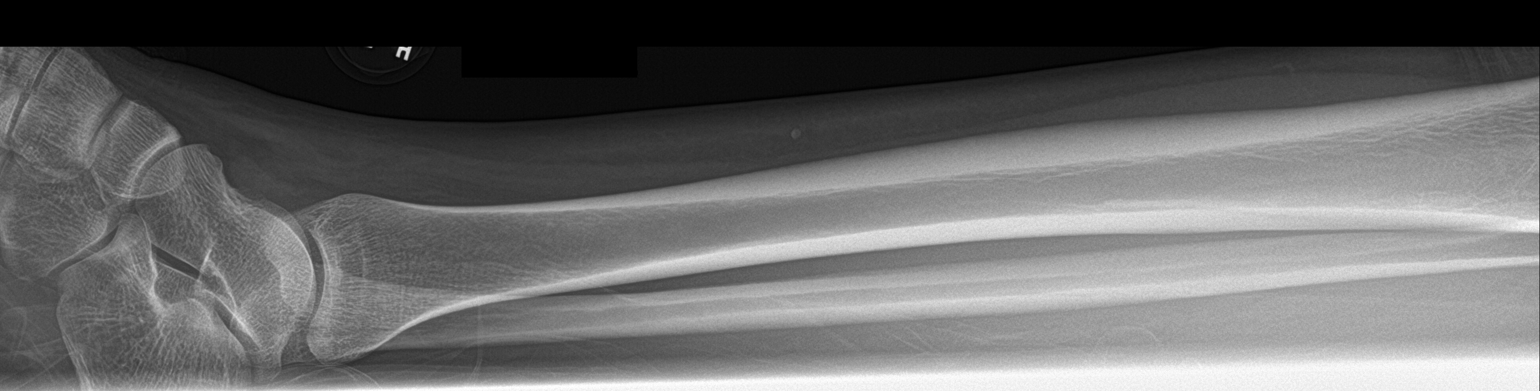

[4 of 4 positions shown; findings below may reference images not displayed]

FINDINGS: There is no evidence of fracture or other focal bone lesions. There
is soft tissue swelling about the lateral malleolus, better
appreciated on associated ankle radiograph.
IMPRESSION: Negative.

## 2021-09-06 ENCOUNTER — Other Ambulatory Visit (HOSPITAL_COMMUNITY)
Admission: RE | Admit: 2021-09-06 | Discharge: 2021-09-06 | Disposition: A | Discharge status: Home / Self Care | Payer: Medicaid Other | Source: Ambulatory Visit | Attending: Advanced Practice Midwife | Admitting: Advanced Practice Midwife

## 2021-09-06 ENCOUNTER — Other Ambulatory Visit: Payer: Self-pay

## 2021-09-06 ENCOUNTER — Ambulatory Visit (INDEPENDENT_AMBULATORY_CARE_PROVIDER_SITE_OTHER): Payer: Medicaid Other | Admitting: Advanced Practice Midwife

## 2021-09-06 ENCOUNTER — Encounter: Payer: Self-pay | Admitting: Advanced Practice Midwife

## 2021-09-06 VITALS — BP 100/66 | HR 94 | Temp 98.1°F | Wt 140.4 lb

## 2021-09-06 DIAGNOSIS — Z7251 High risk heterosexual behavior: Secondary | ICD-10-CM

## 2021-09-06 DIAGNOSIS — O26899 Other specified pregnancy related conditions, unspecified trimester: Secondary | ICD-10-CM | POA: Diagnosis not present

## 2021-09-06 DIAGNOSIS — Z3A34 34 weeks gestation of pregnancy: Secondary | ICD-10-CM | POA: Insufficient documentation

## 2021-09-06 DIAGNOSIS — N898 Other specified noninflammatory disorders of vagina: Secondary | ICD-10-CM | POA: Diagnosis not present

## 2021-09-06 DIAGNOSIS — Z3490 Encounter for supervision of normal pregnancy, unspecified, unspecified trimester: Secondary | ICD-10-CM | POA: Insufficient documentation

## 2021-09-06 DIAGNOSIS — Z3480 Encounter for supervision of other normal pregnancy, unspecified trimester: Secondary | ICD-10-CM

## 2021-09-06 NOTE — Progress Notes (Signed)
   PRENATAL VISIT NOTE  Subjective:  Catherine Nixon is a 20 y.o. G2P1001 at [redacted]w[redacted]d being seen today for ongoing prenatal care.  She is currently monitored for the following issues for this low-risk pregnancy and has GBS bacteriuria; Supervision of other normal pregnancy, antepartum; and Preterm uterine contractions in third trimester, antepartum on their problem list.  Patient reports  vaginal discharge .  Contractions: Irregular. Vag. Bleeding: None.  Movement: Present. Denies leaking of fluid.   The following portions of the patient's history were reviewed and updated as appropriate: allergies, current medications, past family history, past medical history, past social history, past surgical history and problem list.   Objective:   Vitals:   09/06/21 1520  BP: 100/66  Pulse: 94  Temp: 98.1 F (36.7 C)  Weight: 63.7 kg    Fetal Status: Fetal Heart Rate (bpm): 156   Movement: Present     General:  Alert, oriented and cooperative. Patient is in no acute distress.  Skin: Skin is warm and dry. No rash noted.   Cardiovascular: Normal heart rate noted  Respiratory: Normal respiratory effort, no problems with respiration noted  Abdomen: Soft, gravid, appropriate for gestational age.  Pain/Pressure: Absent     Pelvic: Cervical exam deferred        Extremities: Normal range of motion.  Edema: None  Mental Status: Normal mood and affect. Normal behavior. Normal judgment and thought content.   Assessment and Plan:  Pregnancy: G2P1001 at [redacted]w[redacted]d 1. Supervision of other normal pregnancy, antepartum - wet prep collected today   2. [redacted] weeks gestation of pregnancy - GBS at next visit   Preterm labor symptoms and general obstetric precautions including but not limited to vaginal bleeding, contractions, leaking of fluid and fetal movement were reviewed in detail with the patient. Please refer to After Visit Summary for other counseling recommendations.   Return in about 2 weeks (around  09/20/2021).  Future Appointments  Date Time Provider Department Center  09/06/2021  3:35 PM Armando Reichert, CNM [facility] None    Thressa Sheller DNP, CNM  09/06/21  3:28 PM

## 2021-09-08 LAB — CERVICOVAGINAL ANCILLARY ONLY
Bacterial Vaginitis (gardnerella): POSITIVE — AB
Candida Glabrata: NEGATIVE
Candida Vaginitis: POSITIVE — AB
Chlamydia: NEGATIVE
Comment: NEGATIVE
Comment: NEGATIVE
Comment: NEGATIVE
Comment: NEGATIVE
Comment: NEGATIVE
Comment: NORMAL
Neisseria Gonorrhea: NEGATIVE
Trichomonas: NEGATIVE

## 2021-09-13 ENCOUNTER — Other Ambulatory Visit: Payer: Self-pay

## 2021-09-13 ENCOUNTER — Other Ambulatory Visit (HOSPITAL_COMMUNITY)
Admission: RE | Admit: 2021-09-13 | Discharge: 2021-09-13 | Disposition: A | Discharge status: Home / Self Care | Payer: Medicaid Other | Source: Ambulatory Visit | Attending: Obstetrics and Gynecology | Admitting: Obstetrics and Gynecology

## 2021-09-13 ENCOUNTER — Ambulatory Visit (INDEPENDENT_AMBULATORY_CARE_PROVIDER_SITE_OTHER): Payer: Medicaid Other | Admitting: Obstetrics and Gynecology

## 2021-09-13 ENCOUNTER — Encounter: Payer: Self-pay | Admitting: Obstetrics and Gynecology

## 2021-09-13 VITALS — BP 111/74 | HR 99 | Temp 98.2°F | Wt 141.6 lb

## 2021-09-13 DIAGNOSIS — O26899 Other specified pregnancy related conditions, unspecified trimester: Secondary | ICD-10-CM | POA: Diagnosis not present

## 2021-09-13 DIAGNOSIS — R109 Unspecified abdominal pain: Secondary | ICD-10-CM

## 2021-09-13 DIAGNOSIS — Z789 Other specified health status: Secondary | ICD-10-CM

## 2021-09-13 DIAGNOSIS — N898 Other specified noninflammatory disorders of vagina: Secondary | ICD-10-CM | POA: Diagnosis not present

## 2021-09-13 DIAGNOSIS — O26891 Other specified pregnancy related conditions, first trimester: Secondary | ICD-10-CM

## 2021-09-13 DIAGNOSIS — Z3480 Encounter for supervision of other normal pregnancy, unspecified trimester: Secondary | ICD-10-CM

## 2021-09-13 DIAGNOSIS — Z3A35 35 weeks gestation of pregnancy: Secondary | ICD-10-CM | POA: Diagnosis not present

## 2021-09-13 NOTE — Progress Notes (Signed)
   LOW-RISK PREGNANCY OFFICE VISIT Patient name: Catherine Nixon MRN [id number]  Date of birth: 2001-06-01 Chief Complaint:   Routine Prenatal Visit  History of Present Illness:   Khaniya Tenaglia is a 20 y.o. G40P1001 female at [redacted]w[redacted]d with an Estimated Date of Delivery: 10/17/21 being seen today for ongoing management of a low-risk pregnancy.  Today she reports occasional contractions and pelvic pressure. Contractions: Irregular. Vag. Bleeding: None.  Movement: Present. denies leaking of fluid. Review of Systems:   Pertinent items are noted in HPI Denies abnormal vaginal discharge w/ itching/odor/irritation, headaches, visual changes, shortness of breath, chest pain, abdominal pain, severe nausea/vomiting, or problems with urination or bowel movements unless otherwise stated above. Pertinent History Reviewed:  Reviewed past medical,surgical, social, obstetrical and family history.  Reviewed problem list, medications and allergies. Physical Assessment:   Vitals:   09/13/21 1508  BP: 111/74  Pulse: 99  Temp: 98.2 F (36.8 C)  Weight: 141 lb 9.6 oz (64.2 kg)  Body mass index is 28.6 kg/m.        Physical Examination:   General appearance: Well appearing, and in no distress  Mental status: Alert, oriented to person, place, and time  Skin: Warm & dry  Cardiovascular: Normal heart rate noted  Respiratory: Normal respiratory effort, no distress  Abdomen: Soft, gravid, nontender  Pelvic: Cervical exam performed  Dilation: Closed Effacement (%): Thick Station: Ballotable, -3  Extremities: Edema: Trace  Fetal Status: Fetal Heart Rate (bpm): 152 Fundal Height: 34 cm Movement: Present Presentation: Vertex  No results found for this or any previous visit (from the past 24 hour(s)).  Assessment & Plan:  1) Low-risk pregnancy G2P1001 at [redacted]w[redacted]d with an Estimated Date of Delivery: 10/17/21   2) Supervision of other normal pregnancy, antepartum - Information provided on PTL and preventing PTB    3) Vaginal discharge during pregnancy, antepartum  - Cervicovaginal ancillary only  4) Abdominal pain during pregnancy, first trimester  5) Language barrier affecting health care - [employer] Video Spanish Interpreter, [place] #[id number] used for entire visit   6) [redacted] weeks gestation of pregnancy   Meds: No orders of the defined types were placed in this encounter.  Labs/procedures today: cervical exam  Plan:  Continue routine obstetrical care   Reviewed: Preterm labor symptoms and general obstetric precautions including but not limited to vaginal bleeding, contractions, leaking of fluid and fetal movement were reviewed in detail with the patient.  All questions were answered.   Follow-up: Return in about 1 week (around 09/20/2021) for Return OB w/GBS.  No orders of the defined types were placed in this encounter.  Raelyn Mora MSN, CNM 09/13/2021 3:16 PM

## 2021-09-13 NOTE — Patient Instructions (Signed)
Prevencin del parto prematuro Preventing Preterm Birth Se conoce como parto prematuro al nacimiento del beb entre las semanas 20 y 37 del Psychiatrist. Un embarazo a trmino comprende un mnimo de 37 semanas. El parto prematuro puede aumentar el riesgo de complicaciones para el beb porque no ha madurado completamente antes de Psychologist, clinical. Cmo puede afectar al beb el parto prematuro? Las complicaciones del parto prematuro pueden incluir las siguientes: Problemas respiratorios. Problemas de visin o audicin. Dificultad para alimentarse. Infecciones o inflamacin del tubo digestivo (colitis). [employer] al nacer o muy bajo peso al nacer. Dao cerebral que causa retrasos en el desarrollo y discapacidades de aprendizaje, y que afecta al movimiento y la coordinacin (parlisis cerebral). Mayor riesgo de padecer diabetes, enfermedades cardacas y presin arterial alta en el futuro. Qu puede aumentar mi riesgo de tener un parto prematuro? Antecedentes mdicos No se conoce la causa exacta de los partos prematuros. Los siguientes factores pueden hacerla ms propensa a tener un parto prematuro: Recibir un diagnstico de placenta previa. Esta es una afeccin en la que la placenta cubre la parte inferior del tero (cuello uterino), que se abre hacia la vagina. Ciertas afecciones del Psychiatrist actual y de Proofreader, como: Haber tenido un parto prematuro antes. Estar embarazada de ms de un beb. Tener embarazos seguidos con menos de 18 meses de diferencia. Ciertas anormalidades en el beb en gestacin. Hemorragia vaginal durante el embarazo. Quedar embarazada a travs de fertilizacin in vitro (FIV). Tener sobrepeso o [place]. Antecedentes mdicos de lo siguiente: ITS (infecciones de transmisin sexual) u otras infecciones en las vas urinarias y la vagina. Enfermedades a Air cabin crew (crnicas), como problemas de coagulacin sangunea, diabetes o hipertensin arterial. Cuello uterino  corto. Factores relacionados con el ambiente y el estilo de vida Consumir drogas o productos que contengan tabaco. Consumo de alcohol. Tener estrs y no contar con apoyo social. Violencia en el hogar (violencia domstica). Estar expuesta a ciertas sustancias qumicas o contaminantes del ambiente. Qu medidas puedo tomar para evitar un parto prematuro? Atencin mdica Lo ms importante que puede hacer para disminuir el riesgo de Warehouse manager un parto prematuro es Barista atencin mdica de rutina durante el Psychiatrist (cuidado prenatal). Concurra a todas las visitas de seguimiento. Esto es importante. Si corre un riesgo alto de Warehouse manager un parto prematuro: La podran derivar a un mdico que se especialice en el control de embarazos de Conservator, museum/gallery (perinatlogo). Tal vez le receten medicamentos para ayudar a Ship broker. Estilo de vida Ciertos cambios en el estilo de vida tambin pueden disminuir el riesgo de tener un parto prematuro: Espere al menos 6 meses despus de un embarazo para volver a Scientist, research (physical sciences). Antes de Kerry Kass, logre un peso saludable. Si tiene sobrepeso, trabaje con el mdico para adelgazar sin riegos. No consuma ningn producto que contenga nicotina o tabaco. Estos productos incluyen cigarrillos, tabaco para Theatre manager y aparatos de vapeo, como los Administrator, Civil Service. Si necesita ayuda para dejar de fumar, consulte al mdico. No beba alcohol. No consuma drogas. Siga una dieta saludable. Controle otros problemas mdicos que tenga, como por ejemplo, diabetes o presin arterial alta. Dnde buscar apoyo Para obtener ms ayuda, considere lo siguiente: Hablar con el mdico. Hablar con un terapeuta o un asesor de consumo de drogas si necesita ayuda para dejar de hacerlo. Trabajar con un nutricionista o un entrenador fsico para Technical brewer peso saludable. Unirse a un grupo de apoyo. Dnde buscar ms informacin Obtenga ms informacin sobre cmo prevenir un  Psychiatrist  prematuro en los siguientes sitios: Doctor, hospital for Disease Control and Prevention (Centros para el Control y [place]): StoreMirror.com.cy March of Dimes: marchofdimes.org American Pregnancy Association (Asociacin Estadounidense del Embarazo): americanpregnancy.org Comunquese con un mdico si: Tiene cualquiera de estos sntomas de trabajo de parto prematuro antes de las 37 semanas: Cambio o aumento de la secrecin vaginal. Prdida de lquido por la vagina. Presin o calambres en la parte inferior del abdomen. Dolor de espalda que no se calma o empeora. Endurecimiento regular (contracciones) en la parte inferior del abdomen. Solicite ayuda de inmediato si: Tiene contracciones dolorosas y regulares cada 5 minutos o menos. Rompe la bolsa. Resumen Parto prematuro significa tener al beb [employer] 20 a 37 del embarazo. El parto prematuro puede poner al beb en riesgo de complicaciones de [place]. No se conoce la causa exacta de los partos prematuros. Obtener un buen cuidado prenatal de rutina puede ayudar a Training and development officer. Concurra a [place]. Esto es importante. Comunquese con un mdico si tiene sntomas de trabajo de [employer]. Esta informacin no tiene Marine scientist el consejo del mdico. Asegrese de hacerle al mdico cualquier pregunta que tenga. Document Revised: 10/14/2020 Document Reviewed: 10/14/2020 Elsevier Patient Education  2022 [place] prematuro Preterm Labor La duracin normal de un embarazo es de 39 a 41 semanas. Se llama trabajo de parto prematuro cuando este se inicia antes de las 37 semanas de [place]. Los bebs que nacen de forma prematura y sobreviven pueden no estar completamente desarrollados y correr un mayor riesgo de tener problemas a largo plazo, como parlisis cerebral, retrasos en el desarrollo y problemas de la vista y el odo. Los bebs que nacen antes de tiempo pueden tener  problemas poco despus del nacimiento. Los bebs prematuros pueden estar relacionados con la regulacin del azcar en la sangre, la temperatura corporal, la frecuencia cardaca y la frecuencia respiratoria. Estos bebs suelen tener problemas para alimentarse. El riesgo de tener problemas es mayor para los bebs que nacen antes de las 2 semanas de [place]. Cules son las causas? Se desconoce la causa exacta de esta afeccin. Qu incrementa el riesgo? Es ms probable que tenga un parto prematuro si tiene ciertos factores de riesgo que guardan relacin con sus antecedentes mdicos, problemas en el embarazo en curso y en los [place], y factores relacionados con el estilo de vida. Antecedentes mdicos Tiene anormalidades en el tero, por ejemplo, el cuello uterino corto. Tiene ITS (infecciones de transmisin sexual) u otras infecciones en las vas urinarias y la vagina. Tiene enfermedades crnicas, como problemas de coagulacin sangunea, diabetes o hipertensin arterial. Tiene sobrepeso o bajo peso. Embarazo en curso y Building control surveyor anteriores Ha tenido trabajo de parto prematuro anteriormente. Tiene un embarazo gemelar o mltiple. Le han diagnosticado una afeccin en la que la placenta cubre el cuello uterino (placenta previa). Esper menos de 18 meses entre un parto y un Merchant navy officer. El beb en gestacin tiene algunas anormalidades. Tiene sangrado vaginal durante el embarazo. Qued embarazada a travs de la fertilizacin in vitro (FIV). Factores relacionados con el ambiente y el estilo de vida Consume productos que contienen tabaco o toma bebidas alcohlicas. Consume drogas. Tiene estrs y no cuenta con apoyo social. Sufre violencia domstica. Est expuesta a ciertas sustancias qumicas o contaminantes ambientales. Otros factores Es Garment/textile technologist de [place] de 93 aos de edad. Cules son los signos o sntomas? Los sntomas de esta afeccin incluyen: Marketing executive similares a los que  ocurren durante el perodo menstrual. Los calambres pueden presentarse con diarrea. Dolor en el abdomen o en la parte inferior de la espalda. Contracciones regulares que se pueden sentir como un endurecimiento del abdomen. Una sensacin de mayor presin en la pelvis. Aumento de la secrecin de mucosidad acuosa o sanguinolenta de la vagina. Rotura de bolsa (rotura de saco amnitico). Cmo se diagnostica? Esta afeccin se diagnostica en funcin de lo siguiente: Los antecedentes mdicos y un examen fsico. Un examen plvico. Sherlyn Lees. Monitorear el tero para Tree surgeon. Otros estudios, incluidos los siguientes: Un hisopado del cuello uterino para Engineer, manufacturing una sustancia qumica llamada fibronectina fetal. Anlisis de [place]. Cmo se trata? El tratamiento de [address] afeccin depende del tiempo de su [place], su estado y la salud de su beb. El tratamiento puede incluir: Tomar medicamentos como, por ejemplo: Medicamentos hormonales. Estos se pueden administrar de forma temprana en el embarazo para ayudar a Visual merchandiser. Medicamentos para TEFL teacher las contracciones. Medicamentos que ayudan a [employer] del beb. Estos se pueden recetar si el riesgo de parto es [place]. Medicamentos para ayudar a proteger a su beb de complicaciones cerebrales y nerviosas, como la parlisis cerebral. Reposo en cama. Si el trabajo de parto se inicia antes de las 34 semanas de [place], es posible que deba hospitalizarse. Llevar a cabo el parto. Siga estas instrucciones en su casa:  No consuma ningn producto que contenga nicotina o tabaco. Estos productos incluyen cigarrillos, tabaco para mascar y aparatos de vapeo, como los Administrator, Civil Service. Si necesita ayuda para dejar de fumar, consulte al mdico. No beba alcohol. Use los medicamentos de venta libre y los recetados solamente como se lo haya indicado el mdico. Haga reposo como se lo haya indicado el mdico. Retome sus  actividades normales segn lo indicado por el mdico. Pregntele al mdico qu actividades son seguras para usted. Concurra a todas las visitas de seguimiento. Esto es importante. Cmo se previene? Para aumentar las probabilidades de tener un embarazo a trmino, Financial planner en cuenta lo siguiente: No consuma drogas ni use medicamentos que no le hayan recetado Academic librarian. Hable con el mdico antes de tomar suplementos a base de hierbas aunque los [employer]. Asegrese de llegar a un peso Office manager. Tenga cuidado con las infecciones. Si cree que puede tener una infeccin, consulte al mdico para que la revisen. Los sntomas de infeccin pueden incluir los siguientes: [place]. Secrecin vaginal anormal o con mal olor. Ardor o dolor al [employer]. Necesidad urgente de Geographical information systems officer. Miccin frecuente u orinar pequeas cantidades con frecuencia. Sangre en la orina u orina que huele mal o de manera inusual. Dnde buscar ms informacin U.S. Department of Health and [employer], Office on [employer] (Oficina para la Salud de la Mujer del Departamento de Salud y Servicios Humanos de los EE. UU.): http://hoffman.com/ [employer] of Obstetricians and Gynecologists (Colegio Estadounidense de Obstetras y Scientific laboratory technician): www.acog.org Centers for Disease Control and Prevention, Preterm Birth (Centros para Air traffic controller y Engineer, agricultural Prevencin de [place], [place] prematuro): FootballExhibition.com.br Comunquese con un mdico si: Piensa que est teniendo un trabajo de parto prematuro. Tiene signos o sntomas de trabajo de [employer]. Tiene sntomas de infeccin. Solicite ayuda de inmediato si: Tiene contracciones dolorosas y regulares cada 5 minutos o menos. Rompe la bolsa. Resumen Se llama trabajo de parto prematuro cuando se inicia antes de las 37 semanas de [place]. El parto prematuro aumenta el riesgo del beb de desarrollar problemas a Air cabin crew. Es ms  probable que  tenga un parto prematuro si tiene ciertos factores de riesgo que guardan relacin con sus antecedentes mdicos, problemas en el embarazo en curso y en los [place], y factores relacionados con el estilo de vida. Concurra a [place]. Esto es importante. Comunquese con un mdico si tiene signos o sntomas de trabajo de [employer]. Esta informacin no tiene Marine scientist el consejo del mdico. Asegrese de hacerle al mdico cualquier pregunta que tenga. Document Revised: 10/18/2020 Document Reviewed: 10/18/2020 Elsevier Patient Education  2022 [employer].

## 2021-09-14 LAB — CERVICOVAGINAL ANCILLARY ONLY
Bacterial Vaginitis (gardnerella): POSITIVE — AB
Candida Glabrata: NEGATIVE
Candida Vaginitis: POSITIVE — AB
Chlamydia: NEGATIVE
Comment: NEGATIVE
Comment: NEGATIVE
Comment: NEGATIVE
Comment: NEGATIVE
Comment: NEGATIVE
Comment: NORMAL
Neisseria Gonorrhea: NEGATIVE
Trichomonas: NEGATIVE

## 2021-09-15 ENCOUNTER — Other Ambulatory Visit: Payer: Self-pay | Admitting: *Deleted

## 2021-09-15 DIAGNOSIS — N898 Other specified noninflammatory disorders of vagina: Secondary | ICD-10-CM

## 2021-09-15 DIAGNOSIS — B3731 Acute candidiasis of vulva and vagina: Secondary | ICD-10-CM

## 2021-09-15 DIAGNOSIS — B9689 Other specified bacterial agents as the cause of diseases classified elsewhere: Secondary | ICD-10-CM

## 2021-09-15 DIAGNOSIS — N760 Acute vaginitis: Secondary | ICD-10-CM

## 2021-09-15 MED ORDER — METRONIDAZOLE 500 MG PO TABS: 500.0000 mg | ORAL_TABLET | Freq: Two times a day (BID) | ORAL | 0 refills | Status: AC

## 2021-09-15 MED ORDER — TERCONAZOLE 0.4 % VA CREA: 1.0000 | TOPICAL_CREAM | Freq: Every day | VAGINAL | 0 refills | Status: DC

## 2021-09-19 ENCOUNTER — Ambulatory Visit: Payer: Self-pay

## 2021-09-20 ENCOUNTER — Ambulatory Visit (INDEPENDENT_AMBULATORY_CARE_PROVIDER_SITE_OTHER): Payer: Medicaid Other

## 2021-09-20 ENCOUNTER — Other Ambulatory Visit: Payer: Self-pay

## 2021-09-20 VITALS — BP 96/60 | HR 84 | Temp 98.4°F | Wt 144.2 lb

## 2021-09-20 DIAGNOSIS — R8271 Bacteriuria: Secondary | ICD-10-CM

## 2021-09-20 DIAGNOSIS — Z3A36 36 weeks gestation of pregnancy: Secondary | ICD-10-CM

## 2021-09-20 DIAGNOSIS — Z789 Other specified health status: Secondary | ICD-10-CM

## 2021-09-20 DIAGNOSIS — Z3480 Encounter for supervision of other normal pregnancy, unspecified trimester: Secondary | ICD-10-CM

## 2021-09-20 NOTE — Progress Notes (Signed)
° °  PRENATAL VISIT NOTE  Subjective:  Catherine Nixon is a 20 y.o. G2P1001 at [redacted]w[redacted]d being seen today for ongoing prenatal care. She is currently monitored for the following issues for this low-risk pregnancy and has GBS bacteriuria; Supervision of other normal pregnancy, antepartum; Preterm uterine contractions in third trimester, antepartum; and Language barrier affecting health care on their problem list.  Patient reports intermittent low back pain and pelvic pressure. Pain is worse with walking.  Contractions: Irregular. Vag. Bleeding: None.  Movement: Present. Denies leaking of fluid.   The following portions of the patient's history were reviewed and updated as appropriate: allergies, current medications, past family history, past medical history, past social history, past surgical history and problem list.   Objective:   Vitals:   09/20/21 1521  BP: 96/60  Pulse: 84  Temp: 98.4 F (36.9 C)  Weight: 144 lb 3.2 oz (65.4 kg)    Fetal Status: Fetal Heart Rate (bpm): 126 Fundal Height: 35 cm Movement: Present     General:  Alert, oriented and cooperative. Patient is in no acute distress.  Skin: Skin is warm and dry. No rash noted.   Cardiovascular: Normal heart rate noted  Respiratory: Normal respiratory effort, no problems with respiration noted  Abdomen: Soft, gravid, appropriate for gestational age.  Pain/Pressure: Present     Pelvic: Cervical exam deferred        Extremities: Normal range of motion.  Edema: None  Mental Status: Normal mood and affect. Normal behavior. Normal judgment and thought content.   Assessment and Plan:  Pregnancy: G2P1001 at [redacted]w[redacted]d 1. Supervision of other normal pregnancy, antepartum - Routine OB. Doing well - Reports intermittent back pain/pelvic pressure. Reassurance provided. Heat/ice, adequate water intake, Tylenol recommended - Anticipatory guidance for upcoming appointments provided  2. [redacted] weeks gestation of pregnancy   3. GBS bacteriuria -  Treat in labor  4. Language barrier affecting health care - Spanish interpretor used for today's visit  Term labor symptoms and general obstetric precautions including but not limited to vaginal bleeding, contractions, leaking of fluid and fetal movement were reviewed in detail with the patient. Please refer to After Visit Summary for other counseling recommendations.   Return in about 1 week (around 09/27/2021).  Future Appointments  Date Time Provider Department Center  09/27/2021  2:15 PM Bernerd Limbo, CNM [facility] None  10/04/2021  1:55 PM Raelyn Mora, CNM [facility] None  10/12/2021  2:15 PM Raelyn Mora, CNM [facility] None    Brand Males, CNM 09/20/21 4:01 PM

## 2021-09-27 ENCOUNTER — Other Ambulatory Visit: Payer: Self-pay

## 2021-09-27 ENCOUNTER — Ambulatory Visit (INDEPENDENT_AMBULATORY_CARE_PROVIDER_SITE_OTHER): Payer: Medicaid Other | Admitting: Certified Nurse Midwife

## 2021-09-27 VITALS — BP 109/69 | HR 96 | Temp 98.3°F | Wt 147.6 lb

## 2021-09-27 DIAGNOSIS — Z3493 Encounter for supervision of normal pregnancy, unspecified, third trimester: Secondary | ICD-10-CM

## 2021-09-27 DIAGNOSIS — Z3A37 37 weeks gestation of pregnancy: Secondary | ICD-10-CM

## 2021-09-27 NOTE — Progress Notes (Signed)
° °  PRENATAL VISIT NOTE  Subjective:  Catherine Nixon is a 20 y.o. G2P1001 at [redacted]w[redacted]d being seen today for ongoing prenatal care.  She is currently monitored for the following issues for this low-risk pregnancy and has GBS bacteriuria; Supervision of other normal pregnancy, antepartum; Preterm uterine contractions in third trimester, antepartum; and Language barrier affecting health care on their problem list.  Patient reports occasional contractions.  Contractions: Irregular. Vag. Bleeding: None.  Movement: Present. Denies leaking of fluid.   The following portions of the patient's history were reviewed and updated as appropriate: allergies, current medications, past family history, past medical history, past social history, past surgical history and problem list.   Objective:   Vitals:   09/27/21 1331  BP: 109/69  Pulse: 96  Temp: 98.3 F (36.8 C)  Weight: 147 lb 9.6 oz (67 kg)    Fetal Status: Fetal Heart Rate (bpm): 135 Fundal Height: 36 cm Movement: Present  Presentation: Vertex  General:  Alert, oriented and cooperative. Patient is in no acute distress.  Skin: Skin is warm and dry. No rash noted.   Cardiovascular: Normal heart rate noted  Respiratory: Normal respiratory effort, no problems with respiration noted  Abdomen: Soft, gravid, appropriate for gestational age.  Pain/Pressure: Present     Pelvic: Cervical exam deferred        Extremities: Normal range of motion.  Edema: None  Mental Status: Normal mood and affect. Normal behavior. Normal judgment and thought content.   Assessment and Plan:  Pregnancy: G2P1001 at [redacted]w[redacted]d 1. Supervision of low-risk pregnancy, third trimester - Doing well, feeling regular and vigorous fetal movement   2. [redacted] weeks gestation of pregnancy - Routine OB care   Term labor symptoms and general obstetric precautions including but not limited to vaginal bleeding, contractions, leaking of fluid and fetal movement were reviewed in detail with the  patient. Please refer to After Visit Summary for other counseling recommendations.   Return in about 1 week (around 10/04/2021) for IN-PERSON, LOB.  Future Appointments  Date Time Provider Department Center  09/27/2021  2:15 PM Bernerd Limbo, CNM [facility] None  10/04/2021  1:55 PM Raelyn Mora, CNM [facility] None  10/12/2021  2:15 PM Raelyn Mora, CNM [facility] None    Bernerd Limbo, [place]

## 2021-10-04 ENCOUNTER — Other Ambulatory Visit: Payer: Self-pay

## 2021-10-04 ENCOUNTER — Ambulatory Visit (INDEPENDENT_AMBULATORY_CARE_PROVIDER_SITE_OTHER): Payer: Medicaid Other | Admitting: Obstetrics and Gynecology

## 2021-10-04 VITALS — BP 96/57 | HR 80 | Temp 97.9°F | Wt 145.2 lb

## 2021-10-04 DIAGNOSIS — Z789 Other specified health status: Secondary | ICD-10-CM

## 2021-10-04 DIAGNOSIS — Z3493 Encounter for supervision of normal pregnancy, unspecified, third trimester: Secondary | ICD-10-CM

## 2021-10-04 DIAGNOSIS — R8271 Bacteriuria: Secondary | ICD-10-CM

## 2021-10-04 NOTE — Progress Notes (Addendum)
° °  LOW-RISK PREGNANCY OFFICE VISIT Patient name: Catherine Nixon MRN [id number]  Date of birth: Aug 18, 2001 Chief Complaint:   Routine Prenatal Visit  History of Present Illness:   Catherine Nixon is a 20 y.o. G13P1001 female at [redacted]w[redacted]d with an Estimated Date of Delivery: 10/17/21 being seen today for ongoing management of a low-risk pregnancy.  Today she reports  increased pelvic pressure . Contractions: Not present. Vag. Bleeding: None.  Movement: Present. denies leaking of fluid. Review of Systems:   Pertinent items are noted in HPI Denies abnormal vaginal discharge w/ itching/odor/irritation, headaches, visual changes, shortness of breath, chest pain, abdominal pain, severe nausea/vomiting, or problems with urination or bowel movements unless otherwise stated above. Pertinent History Reviewed:  Reviewed past medical,surgical, social, obstetrical and family history.  Reviewed problem list, medications and allergies. Physical Assessment:   Vitals:   10/04/21 1353  BP: (!) 96/57  Pulse: 80  Temp: 97.9 F (36.6 C)  Weight: 145 lb 3.2 oz (65.9 kg)  Body mass index is 29.33 kg/m.        Physical Examination:   General appearance: Well appearing, and in no distress  Mental status: Alert, oriented to person, place, and time  Skin: Warm & dry  Cardiovascular: Normal heart rate noted  Respiratory: Normal respiratory effort, no distress  Abdomen: Soft, gravid, nontender  Pelvic: Cervical exam deferred         Extremities: Edema: None  Fetal Status: Fetal Heart Rate (bpm): 140 Fundal Height: 35 cm Movement: Present Presentation: Vertex  No results found for this or any previous visit (from the past 24 hour(s)).  Assessment & Plan:  1) Low-risk pregnancy G2P1001 at [redacted]w[redacted]d with an Estimated Date of Delivery: 10/17/21   2) Supervision of low-risk pregnancy, third trimester  3) GBS bacteriuria  4) Language barrier affecting health care  - Patient had requested in-person interpreter, but  they were not here d/t patient being early for her appointment - Patient declined use of the [employer] video interpreter  Meds: No orders of the defined types were placed in this encounter.  Labs/procedures today: none  Plan:  Continue routine obstetrical care   Reviewed: Term labor symptoms and general obstetric precautions including but not limited to vaginal bleeding, contractions, leaking of fluid and fetal movement were reviewed in detail with the patient.  All questions were answered. Check bp weekly, let us know if >140/90.   Follow-up: Return in about 1 week (around 10/11/2021) for Return OB visit.  No orders of the defined types were placed in this encounter.  Raelyn Mora MSN, CNM 10/04/2021 2:24 PM

## 2021-10-08 NOTE — L&D Delivery Note (Signed)
Delivery Note Catherine Nixon is a 21 y.o. G2P1001 at [redacted]w[redacted]d admitted for IOL for DFM at term.   GBS Status: Positive/-- (06/29 0000) Maximum Maternal Temperature: 98.4  Labor course: Initial SVE: 1.5/50/-2. Augmentation with: Cytotec. She then progressed to complete.  ROM: 1h 88m with meconium-stained fluid  Birth: At [phone] a viable female was delivered via spontaneous vaginal delivery (Presentation:cephalic;LOA). Nuchal cord present: No. Shoulders and body delivered in usual fashion. Infant placed directly on mom's abdomen for bonding/skin-to-skin, baby dried and stimulated. Cord clamped x 2 after 1 minute and cut by FOB. Cord blood collected. The placenta separated spontaneously and delivered via gentle cord traction.  Pitocin infused rapidly IV per protocol.  Fundus firm with massage.  Placenta inspected and appears to be intact with a 3 VC.  Placenta/Cord with the following complications: none. Cord pH: n/a Sponge and instrument count were correct x2.  Intrapartum complications:  None Anesthesia:  epidural Episiotomy: none Lacerations:  none Suture Repair:  n/a EBL (mL): 150   Infant: APGAR (1 MIN): 8   APGAR (5 MINS): 9  Infant weight: pending  Mom to postpartum.  Baby to Couplet care / Skin to Skin. Placenta to L&D   Plans to Breastfeed Contraception: Nexplanon Circumcision: N/A  Note sent to [facility]: [place] for pp visit.   Brand Males CNM 10/21/2021 8:51 AM

## 2021-10-12 ENCOUNTER — Ambulatory Visit (INDEPENDENT_AMBULATORY_CARE_PROVIDER_SITE_OTHER): Payer: Medicaid Other | Admitting: Obstetrics and Gynecology

## 2021-10-12 ENCOUNTER — Other Ambulatory Visit: Payer: Self-pay

## 2021-10-12 ENCOUNTER — Other Ambulatory Visit: Payer: Self-pay | Admitting: Obstetrics and Gynecology

## 2021-10-12 VITALS — BP 103/63 | HR 103 | Temp 97.7°F | Wt 148.6 lb

## 2021-10-12 DIAGNOSIS — Z3A39 39 weeks gestation of pregnancy: Secondary | ICD-10-CM

## 2021-10-12 DIAGNOSIS — Z3493 Encounter for supervision of normal pregnancy, unspecified, third trimester: Secondary | ICD-10-CM

## 2021-10-12 DIAGNOSIS — R8271 Bacteriuria: Secondary | ICD-10-CM

## 2021-10-12 NOTE — Progress Notes (Signed)
° °  LOW-RISK PREGNANCY OFFICE VISIT Patient name: Catherine Nixon MRN [id number]  Date of birth: 2000-11-28 Chief Complaint:   Routine Prenatal Visit  History of Present Illness:   Catherine Nixon is a 21 y.o. G72P1001 female at [redacted]w[redacted]d with an Estimated Date of Delivery: 10/17/21 being seen today for ongoing management of a low-risk pregnancy.  Today she reports no complaints. Contractions: Not present. Vag. Bleeding: None.  Movement: Present. denies leaking of fluid. Review of Systems:   Pertinent items are noted in HPI Denies abnormal vaginal discharge w/ itching/odor/irritation, headaches, visual changes, shortness of breath, chest pain, abdominal pain, severe nausea/vomiting, or problems with urination or bowel movements unless otherwise stated above. Pertinent History Reviewed:  Reviewed past medical,surgical, social, obstetrical and family history.  Reviewed problem list, medications and allergies. Physical Assessment:   Vitals:   10/12/21 1426  BP: 103/63  Pulse: (!) 103  Temp: 97.7 F (36.5 C)  Weight: 148 lb 9.6 oz (67.4 kg)  Body mass index is 30.01 kg/m.        Physical Examination:   General appearance: Well appearing, and in no distress  Mental status: Alert, oriented to person, place, and time  Skin: Warm & dry  Cardiovascular: Normal heart rate noted  Respiratory: Normal respiratory effort, no distress  Abdomen: Soft, gravid, nontender  Pelvic: Cervical exam performed  Dilation: 1 Effacement (%): Thick Station: Ballotable  Extremities: Edema: None  Fetal Status: Fetal Heart Rate (bpm): 140 Fundal Height: 36 cm Movement: Present Presentation: Vertex  No results found for this or any previous visit (from the past 24 hour(s)).  Assessment & Plan:  1) Low-risk pregnancy G2P1001 at [redacted]w[redacted]d with an Estimated Date of Delivery: 10/17/21   2) Supervision of low-risk pregnancy, third trimester - IOL scheduled for 41 weeks on 10/24/2021 @ 0700 >> orders placed - Await  labor  3) GBS bacteriuria - IP abx  4) [redacted] weeks gestation of pregnancy    Meds: No orders of the defined types were placed in this encounter.  Labs/procedures today: cervical exam  Plan:  Continue routine obstetrical care   Reviewed: Term labor symptoms and general obstetric precautions including but not limited to vaginal bleeding, contractions, leaking of fluid and fetal movement were reviewed in detail with the patient.  All questions were answered. Has home bp cuff. Check bp weekly, let us know if >140/90.   Follow-up: Return in about 1 week (around 10/19/2021) for Return OB visit.  No orders of the defined types were placed in this encounter.  Raelyn Mora MSN, CNM 10/12/2021 2:39 PM

## 2021-10-12 NOTE — Progress Notes (Signed)
IOL orders entered  Ardelia Wrede, CNM  

## 2021-10-17 ENCOUNTER — Encounter (HOSPITAL_COMMUNITY): Payer: Self-pay | Admitting: *Deleted

## 2021-10-17 ENCOUNTER — Telehealth (HOSPITAL_COMMUNITY): Payer: Self-pay | Admitting: *Deleted

## 2021-10-17 NOTE — Telephone Encounter (Signed)
Preadmission screen Interpreter number [phone] Pt c/o vaginal discharge.  Originally was clear but becoming yellowish.  States was on medicine and it stopped but has returned.  Instructed pt to call her Doctor office and request an interpreter and let them know.  Office called by this RN and notified.

## 2021-10-18 ENCOUNTER — Other Ambulatory Visit: Payer: Self-pay | Admitting: Advanced Practice Midwife

## 2021-10-20 ENCOUNTER — Encounter: Payer: Self-pay | Admitting: Obstetrics and Gynecology

## 2021-10-20 ENCOUNTER — Inpatient Hospital Stay (HOSPITAL_COMMUNITY)
Admission: AD | Admit: 2021-10-20 | Discharge: 2021-10-23 | DRG: 806 | Disposition: A | Discharge status: Home / Self Care | Payer: Medicaid Other | Attending: Obstetrics and Gynecology | Admitting: Obstetrics and Gynecology

## 2021-10-20 ENCOUNTER — Ambulatory Visit (INDEPENDENT_AMBULATORY_CARE_PROVIDER_SITE_OTHER): Payer: Medicaid Other | Admitting: Obstetrics and Gynecology

## 2021-10-20 ENCOUNTER — Other Ambulatory Visit: Payer: Self-pay

## 2021-10-20 ENCOUNTER — Encounter (HOSPITAL_COMMUNITY): Payer: Self-pay | Admitting: Family Medicine

## 2021-10-20 VITALS — BP 98/63 | HR 77 | Temp 98.2°F | Wt 150.0 lb

## 2021-10-20 DIAGNOSIS — Z789 Other specified health status: Secondary | ICD-10-CM | POA: Diagnosis present

## 2021-10-20 DIAGNOSIS — Z3480 Encounter for supervision of other normal pregnancy, unspecified trimester: Secondary | ICD-10-CM

## 2021-10-20 DIAGNOSIS — Z3A41 41 weeks gestation of pregnancy: Secondary | ICD-10-CM | POA: Diagnosis not present

## 2021-10-20 DIAGNOSIS — O368130 Decreased fetal movements, third trimester, not applicable or unspecified: Secondary | ICD-10-CM | POA: Diagnosis not present

## 2021-10-20 DIAGNOSIS — O9081 Anemia of the puerperium: Secondary | ICD-10-CM | POA: Diagnosis not present

## 2021-10-20 DIAGNOSIS — O480 Post-term pregnancy: Secondary | ICD-10-CM | POA: Diagnosis not present

## 2021-10-20 DIAGNOSIS — R8271 Bacteriuria: Secondary | ICD-10-CM

## 2021-10-20 DIAGNOSIS — O99820 Streptococcus B carrier state complicating pregnancy: Secondary | ICD-10-CM | POA: Diagnosis not present

## 2021-10-20 DIAGNOSIS — Z3A40 40 weeks gestation of pregnancy: Secondary | ICD-10-CM

## 2021-10-20 DIAGNOSIS — D62 Acute posthemorrhagic anemia: Secondary | ICD-10-CM | POA: Diagnosis not present

## 2021-10-20 DIAGNOSIS — D509 Iron deficiency anemia, unspecified: Secondary | ICD-10-CM | POA: Diagnosis not present

## 2021-10-20 DIAGNOSIS — Z20822 Contact with and (suspected) exposure to covid-19: Secondary | ICD-10-CM | POA: Diagnosis not present

## 2021-10-20 DIAGNOSIS — O99824 Streptococcus B carrier state complicating childbirth: Secondary | ICD-10-CM | POA: Diagnosis not present

## 2021-10-20 LAB — CBC
HCT: 31.9 % — ABNORMAL LOW (ref 36.0–46.0)
Hemoglobin: 9.8 g/dL — ABNORMAL LOW (ref 12.0–15.0)
MCH: 24.7 pg — ABNORMAL LOW (ref 26.0–34.0)
MCHC: 30.7 g/dL (ref 30.0–36.0)
MCV: 80.4 fL (ref 80.0–100.0)
Platelets: 320 10*3/uL (ref 150–400)
RBC: 3.97 MIL/uL (ref 3.87–5.11)
RDW: 14.9 % (ref 11.5–15.5)
WBC: 7.6 10*3/uL (ref 4.0–10.5)
nRBC: 0 % (ref 0.0–0.2)

## 2021-10-20 LAB — RESP PANEL BY RT-PCR (FLU A&B, COVID) ARPGX2
Influenza A by PCR: NEGATIVE
Influenza B by PCR: NEGATIVE
SARS Coronavirus 2 by RT PCR: NEGATIVE

## 2021-10-20 MED ORDER — SODIUM CHLORIDE 0.9 % IV SOLN
2.0000 g | Freq: Once | INTRAVENOUS | Status: AC
  Administered 2021-10-20: 2 g via INTRAVENOUS
  Filled 2021-10-20: qty 2000

## 2021-10-20 MED ORDER — OXYTOCIN BOLUS FROM INFUSION
333.0000 mL | Freq: Once | INTRAVENOUS | Status: AC
  Administered 2021-10-21: 333 mL via INTRAVENOUS

## 2021-10-20 MED ORDER — MISOPROSTOL 50MCG HALF TABLET
50.0000 ug | ORAL_TABLET | ORAL | Status: DC | PRN
  Administered 2021-10-20 – 2021-10-21 (×2): 50 ug via BUCCAL
  Filled 2021-10-20 (×2): qty 1

## 2021-10-20 MED ORDER — LIDOCAINE HCL (PF) 1 % IJ SOLN: 30.0000 mL | INTRAMUSCULAR | Status: DC | PRN

## 2021-10-20 MED ORDER — OXYTOCIN-SODIUM CHLORIDE 30-0.9 UT/500ML-% IV SOLN
2.5000 [IU]/h | INTRAVENOUS | Status: DC
  Administered 2021-10-21: 2.5 [IU]/h via INTRAVENOUS

## 2021-10-20 MED ORDER — LACTATED RINGERS IV SOLN: 500.0000 mL | INTRAVENOUS | Status: DC | PRN

## 2021-10-20 MED ORDER — ONDANSETRON HCL 4 MG/2ML IJ SOLN
4.0000 mg | Freq: Four times a day (QID) | INTRAMUSCULAR | Status: DC | PRN
  Administered 2021-10-21: 4 mg via INTRAVENOUS
  Filled 2021-10-20: qty 2

## 2021-10-20 MED ORDER — TERBUTALINE SULFATE 1 MG/ML IJ SOLN: 0.2500 mg | Freq: Once | INTRAMUSCULAR | Status: DC | PRN

## 2021-10-20 MED ORDER — HYDROXYZINE HCL 50 MG PO TABS: 50.0000 mg | ORAL_TABLET | Freq: Four times a day (QID) | ORAL | Status: DC | PRN

## 2021-10-20 MED ORDER — ACETAMINOPHEN 325 MG PO TABS
650.0000 mg | ORAL_TABLET | ORAL | Status: DC | PRN
  Administered 2021-10-21: 650 mg via ORAL
  Filled 2021-10-20: qty 2

## 2021-10-20 MED ORDER — CLINDAMYCIN PHOSPHATE 900 MG/50ML IV SOLN: 900.0000 mg | Freq: Three times a day (TID) | INTRAVENOUS | Status: DC

## 2021-10-20 MED ORDER — OXYTOCIN-SODIUM CHLORIDE 30-0.9 UT/500ML-% IV SOLN
1.0000 m[IU]/min | INTRAVENOUS | Status: DC
  Filled 2021-10-20: qty 500

## 2021-10-20 MED ORDER — LACTATED RINGERS IV SOLN: INTRAVENOUS | Status: DC

## 2021-10-20 MED ORDER — FENTANYL CITRATE (PF) 100 MCG/2ML IJ SOLN
100.0000 ug | INTRAMUSCULAR | Status: DC | PRN
  Administered 2021-10-21 (×2): 100 ug via INTRAVENOUS
  Filled 2021-10-20 (×2): qty 2

## 2021-10-20 MED ORDER — SODIUM CHLORIDE 0.9 % IV SOLN
1.0000 g | INTRAVENOUS | Status: DC
  Administered 2021-10-21 (×2): 1 g via INTRAVENOUS
  Filled 2021-10-20 (×2): qty 1000

## 2021-10-20 NOTE — H&P (Addendum)
OBSTETRIC ADMISSION HISTORY AND PHYSICAL  Catherine Nixon is a 21 y.o. female G2P1001 with IUP at [redacted]w[redacted]d by LMP presenting for decreased fetal movement. She reports decreased fetal movement x3 days. Prior to arrival in MAU, she last felt baby move around 1330 today. She has felt movements since arrival. She reports contractions every 15 minutes. She was seen in the office today and was offered IOL and originally declined due to childcare issues. No LOF, no VB, no blurry vision, headaches or peripheral edema, and RUQ pain.  She plans on breast/formula feeding. She requests Nexplanon for birth control. She received her prenatal care at  [facility]    Dating: By LMP --->  Estimated Date of Delivery: 10/17/21  Sono:    @[redacted]w[redacted]d , CWD, normal anatomy, breech presentation, posterior lie, 521g, 54% EFW   Prenatal History/Complications:  -Preterm uterine contractions in third trimester -GBS bacteriuria treated with amoxicillin 04/07/21   Past Medical History: Past Medical History:  Diagnosis Date   Abdominal pain, epigastric 09/15/2020   Medical history non-contributory    Post term pregnancy at [redacted] weeks gestation 10/12/2017   Vaginal delivery 10/13/2017    Past Surgical History: Past Surgical History:  Procedure Laterality Date   CHOLECYSTECTOMY N/A 11/15/2020   Procedure: LAPAROSCOPIC CHOLECYSTECTOMY;  Surgeon: Ralene Ok, MD;  Location: [facility];  Service: General;  Laterality: N/A;    Obstetrical History: OB History     Gravida  2   Para  1   Term  1   Preterm      AB      Living  1      SAB      IAB      Ectopic      Multiple  0   Live Births  1           Social History Social History   Socioeconomic History   Marital status: Single    Spouse name: Not on file   Number of children: 1   Years of education: Not on file   Highest education level: High school graduate  Occupational History   Occupation: Shipping    Comment: [employer]  Tobacco  Use   Smoking status: Never    Passive exposure: Never   Smokeless tobacco: Never  Vaping Use   Vaping Use: Never used  Substance and Sexual Activity   Alcohol use: Never   Drug use: Never   Sexual activity: Yes    Comment: FOB in [place]   Other Topics Concern   Not on file  Social History Narrative   Not on file   Social Determinants of Health   Financial Resource Strain: Low Risk    Difficulty of Paying Living Expenses: Not hard at all  Food Insecurity: No Food Insecurity   Worried About Charity fundraiser in the Last Year: Never true   [place] in the Last Year: Never true  Transportation Needs: No Transportation Needs   Lack of Transportation (Medical): No   Lack of Transportation (Non-Medical): No  Physical Activity: Unknown   Days of Exercise per Week: 7 days   Minutes of Exercise per Session: Not on file  Stress: No Stress Concern Present   Feeling of Stress : Not at all  Social Connections: Moderately Isolated   Frequency of Communication with Friends and Family: More than three times a week   Frequency of Social Gatherings with Friends and Family: More than three times a week   Attends Religious  Services: Never   Database administrator or Organizations: No   Attends Engineer, structural: Never   Marital Status: Married    Family History: Family History  Problem Relation Age of Onset   Colon cancer Mother    Diabetes Father    Asthma Neg Hx    Heart disease Neg Hx    Hypertension Neg Hx    Stroke Neg Hx    Esophageal cancer Neg Hx    Pancreatic cancer Neg Hx    Liver disease Neg Hx     Allergies: No Known Allergies  Medications Prior to Admission  Medication Sig Dispense Refill Last Dose   Misc. Devices (GOJJI WEIGHT SCALE) MISC 1 Device by Does not apply route daily as needed. To weight self daily as needed at home. ICD-10 code: Z34.90 1 each 0 10/20/2021   Blood Pressure Monitoring (BLOOD PRESSURE MONITOR AUTOMAT) DEVI 1 Device by  Does not apply route daily. Automatic blood pressure cuff regular size. To monitor blood pressure regularly at home. ICD-10 code:Z34.90 (Patient not taking: Reported on 10/20/2021) 1 each 0    ferrous sulfate 325 (65 FE) MG EC tablet Take 325 mg by mouth 3 (three) times daily with meals. (Patient not taking: Reported on 10/20/2021)      Prenatal Vit-Fe Fumarate-FA (PRENATAL COMPLETE) 14-0.4 MG TABS Take 1 tablet by mouth daily. 60 tablet 0    terconazole (TERAZOL 7) 0.4 % vaginal cream Place 1 applicator vaginally at bedtime. (Patient not taking: Reported on 10/20/2021) 45 g 0      Review of Systems   All systems reviewed and negative except as stated in HPI  Blood pressure 118/62, pulse 86, temperature 98.4 F (36.9 C), temperature source Oral, resp. rate 20, weight 68.7 kg, last menstrual period 01/10/2021, SpO2 98 %. General appearance: alert and no distress Lungs: normal WOB Heart: well perfused Abdomen: soft, non-tender;gravid Extremities: no erythema, edema or tenderness to palpation of BLEs Presentation: cephalic Fetal monitoringBaseline: 125 bpm, Variability: Good {> 6 bpm), Accelerations: Reactive, and Decelerations: Absent Uterine activity ctx q 2-4 minutes lasting 90-120 seconds long Dilation: 1.5 Effacement (%): 50 Station: -2 Exam by:: D.Simpson,CNM   Prenatal labs: ABO, Rh: --/--/O POS (01/13 1914) Antibody: NEG (01/13 1914) Rubella: 1.02 (07/12 1401) RPR: Non Reactive (10/21 0833)  HBsAg: Negative (07/12 1401)  HIV: Non Reactive (10/21 0833)  GBS:    1 hr Glucola nomral Genetic screening  NIPS LR female, AFP negative, Horizon silent alpha thal carrier Anatomy US normal with EIF  Prenatal Transfer Tool  Maternal Diabetes: No Genetic Screening: Abnormal:  Results: Other: silent alpha thal carrier Maternal Ultrasounds/Referrals: Normal Fetal Ultrasounds or other Referrals:  None Maternal Substance Abuse:  No Significant Maternal Medications:  None Significant  Maternal Lab Results: Group B Strep positive  Results for orders placed or performed during the hospital encounter of 10/20/21 (from the past 24 hour(s))  Resp Panel by RT-PCR (Flu A&B, Covid) Nasopharyngeal Swab   Collection Time: 10/20/21  7:14 PM   Specimen: Nasopharyngeal Swab; Nasopharyngeal(NP) swabs in vial transport medium  Result Value Ref Range   SARS Coronavirus 2 by RT PCR NEGATIVE NEGATIVE   Influenza A by PCR NEGATIVE NEGATIVE   Influenza B by PCR NEGATIVE NEGATIVE  CBC   Collection Time: 10/20/21  7:14 PM  Result Value Ref Range   WBC 7.6 4.0 - 10.5 K/uL   RBC 3.97 3.87 - 5.11 MIL/uL   Hemoglobin 9.8 (L) 12.0 - 15.0 g/dL  HCT 31.9 (L) 36.0 - 46.0 %   MCV 80.4 80.0 - 100.0 fL   MCH 24.7 (L) 26.0 - 34.0 pg   MCHC 30.7 30.0 - 36.0 g/dL   RDW 14.9 11.5 - 15.5 %   Platelets 320 150 - 400 K/uL   nRBC 0.0 0.0 - 0.2 %  Type and screen   Collection Time: 10/20/21  7:14 PM  Result Value Ref Range   ABO/RH(D) O POS    Antibody Screen NEG    Sample Expiration      10/23/2021,2359 Performed at [facility], [place] [address] [place], [place] [address]     Patient Active Problem List   Diagnosis Date Noted   Post term pregnancy at [redacted] weeks gestation 10/20/2021   Language barrier affecting health care 09/13/2021   Preterm uterine contractions in third trimester, antepartum 08/17/2021   Supervision of other normal pregnancy, antepartum 04/18/2021   GBS bacteriuria 04/07/2021    Assessment/Plan:  Gianah Hudley is a 21 y.o. G2P1001 at [redacted]w[redacted]d here for IOL d/t decreased fetal movements.  #Decreased fetal movement: NST reactive and reassuring in MAU today. Continue monitoring. #Labor: cervix 1.5/50/-2. One dose of Cytotec given at 2025. Plan for SVD #Pain: plan for epidural #FWB: Category I #ID: GBS positive. GBS bacteriuria Treated with amoxicillin 04/07/21. Will start penicillin protocol.  #MOF: Breast/formula #MOC: Glennie Hawk, DO  10/20/2021, 9:58  PM  GME ATTESTATION:  I saw and evaluated the patient. I agree with the findings and the plan of care as documented in the residents note.  Darrelyn Hillock, DO OB Fellow, [facility] 10/21/2021 2:41 AM

## 2021-10-20 NOTE — MAU Note (Signed)
Presents with c/o decreased FM x3 days, but has only felt one movement in the last 3 hours.  Reports had ate and drank cold water approximately 1 hour ago.  Denies VB or LOF.  Reports seen in office today, reported decreased FM @ visit.

## 2021-10-20 NOTE — Progress Notes (Signed)
° °  PRENATAL VISIT NOTE  Subjective:  Catherine Nixon is a 21 y.o. G2P1001 at [redacted]w[redacted]d being seen today for ongoing prenatal care.  She is currently monitored for the following issues for this low-risk pregnancy and has GBS bacteriuria; Supervision of other normal pregnancy, antepartum; Preterm uterine contractions in third trimester, antepartum; and Language barrier affecting health care on their problem list.  Patient reports  decreased fetal movement since 6 am .  Contractions: Not present. Vag. Bleeding: None.  Movement: (!) Decreased. Denies leaking of fluid.   The following portions of the patient's history were reviewed and updated as appropriate: allergies, current medications, past family history, past medical history, past social history, past surgical history and problem list.   Objective:   Vitals:   10/20/21 1105  BP: 98/63  Pulse: 77  Temp: 98.2 F (36.8 C)  Weight: 150 lb (68 kg)    Fetal Status: Fetal Heart Rate (bpm): NST   Movement: (!) Decreased     General:  Alert, oriented and cooperative. Patient is in no acute distress.  Skin: Skin is warm and dry. No rash noted.   Cardiovascular: Normal heart rate noted  Respiratory: Normal respiratory effort, no problems with respiration noted  Abdomen: Soft, gravid, appropriate for gestational age.  Pain/Pressure: Present     Pelvic: Cervical exam deferred        Extremities: Normal range of motion.  Edema: None  Mental Status: Normal mood and affect. Normal behavior. Normal judgment and thought content.   Assessment and Plan:  Pregnancy: G2P1001 at [redacted]w[redacted]d 1. Supervision of other normal pregnancy, antepartum Patient is doing well Discussed starting IOL today but patient declined due to child care and desires to keep IOL on 10/24/21. Patient reports good fetal movement now NST reviewed and category I- baseline 120, mod variability, +accels, no decels Advised patient to present to MAU if decreased or no fetal movement is  felt  2. GBS bacteriuria Prophylaxis in labor  Term labor symptoms and general obstetric precautions including but not limited to vaginal bleeding, contractions, leaking of fluid and fetal movement were reviewed in detail with the patient. Please refer to After Visit Summary for other counseling recommendations.   Return in about 6 weeks (around 12/01/2021) for postpartum.  Future Appointments  Date Time Provider Department Center  10/24/2021  7:00 AM [facility] SCHED ROOM [facility] None    Catalina Antigua, MD

## 2021-10-20 NOTE — MAU Provider Note (Signed)
History     CSN: [id number]  Arrival date and time: 10/20/21 1742   None     Chief Complaint  Patient presents with   Decreased Fetal Movement   HPI Catherine Nixon is a 21 y.o G2P1001 at [redacted]w[redacted]d who presents to MAU for decreased fetal movement. Patient reports fetal movement has been decreased x3 days. She reports prior to arrival she last felt baby move around 1:30pm. Has felt movement since arrival, however movements are "slow". She was seen in the office today and was offered IOL today/tonight, however declined d/t childcare issues. She reports contractions every 15 mins. Denies LOF or vaginal bleeding.    OB History     Gravida  2   Para  1   Term  1   Preterm      AB      Living  1      SAB      IAB      Ectopic      Multiple  0   Live Births  1           Past Medical History:  Diagnosis Date   Abdominal pain, epigastric 09/15/2020   Medical history non-contributory    Post term pregnancy at [redacted] weeks gestation 10/12/2017   Vaginal delivery 10/13/2017    Past Surgical History:  Procedure Laterality Date   CHOLECYSTECTOMY N/A 11/15/2020   Procedure: LAPAROSCOPIC CHOLECYSTECTOMY;  Surgeon: Ralene Ok, MD;  Location: [facility] OR;  Service: General;  Laterality: N/A;    Family History  Problem Relation Age of Onset   Colon cancer Mother    Diabetes Father    Asthma Neg Hx    Heart disease Neg Hx    Hypertension Neg Hx    Stroke Neg Hx    Esophageal cancer Neg Hx    Pancreatic cancer Neg Hx    Liver disease Neg Hx     Social History   Tobacco Use   Smoking status: Never    Passive exposure: Never   Smokeless tobacco: Never  Vaping Use   Vaping Use: Never used  Substance Use Topics   Alcohol use: Never   Drug use: Never    Allergies: No Known Allergies  Medications Prior to Admission  Medication Sig Dispense Refill Last Dose   Misc. Devices (GOJJI WEIGHT SCALE) MISC 1 Device by Does not apply route daily as needed. To weight  self daily as needed at home. ICD-10 code: Z34.90 1 each 0 10/20/2021   Blood Pressure Monitoring (BLOOD PRESSURE MONITOR AUTOMAT) DEVI 1 Device by Does not apply route daily. Automatic blood pressure cuff regular size. To monitor blood pressure regularly at home. ICD-10 code:Z34.90 (Patient not taking: Reported on 10/20/2021) 1 each 0    ferrous sulfate 325 (65 FE) MG EC tablet Take 325 mg by mouth 3 (three) times daily with meals. (Patient not taking: Reported on 10/20/2021)      Prenatal Vit-Fe Fumarate-FA (PRENATAL COMPLETE) 14-0.4 MG TABS Take 1 tablet by mouth daily. 60 tablet 0    terconazole (TERAZOL 7) 0.4 % vaginal cream Place 1 applicator vaginally at bedtime. (Patient not taking: Reported on 10/20/2021) 45 g 0     Review of Systems  Constitutional: Negative.   Respiratory: Negative.    Cardiovascular: Negative.   Gastrointestinal:  Positive for abdominal pain (contractions).  Genitourinary: Negative.   Musculoskeletal: Negative.   Neurological: Negative.    Physical Exam   Patient Vitals for the past 24 hrs:  BP  Temp Temp src Pulse Resp SpO2 Weight  10/20/21 1751 118/62 98.4 F (36.9 C) Oral 86 20 98 % --  10/20/21 1749 -- -- -- -- -- -- 68.7 kg    Physical Exam Vitals and nursing note reviewed.  Constitutional:      General: She is not in acute distress. Eyes:     Extraocular Movements: Extraocular movements intact.     Pupils: Pupils are equal, round, and reactive to light.  Cardiovascular:     Rate and Rhythm: Normal rate.  Pulmonary:     Effort: Pulmonary effort is normal.  Abdominal:     Palpations: Abdomen is soft.     Tenderness: There is no abdominal tenderness.     Comments: Gravid   Genitourinary:    Comments: SVE: 1.5/60/-1, vertex Musculoskeletal:        General: Normal range of motion.     Cervical back: Normal range of motion.  Skin:    General: Skin is warm and dry.  Neurological:     General: No focal deficit present.     Mental Status: She is  alert and oriented to person, place, and time.  Psychiatric:        Mood and Affect: Mood normal.        Behavior: Behavior normal.        Thought Content: Thought content normal.        Judgment: Judgment normal.   NST: FHR: 120 bpm, moderate variability, +15x15 accels, no decels Toco: Q 9-46mins  MAU Course  Procedures NST  MDM NST reactive and reassuring, however given decreased fetal movement x3 days and >40 weeks, I recommend admission. Patient agreeable with POC.   Assessment and Plan  [redacted] weeks gestation of pregnancy Decreased fetal movement  - Admit to L&D. Labor team notified of patient admission   Liberty Sink 10/20/2021, 6:53 PM

## 2021-10-21 ENCOUNTER — Encounter (HOSPITAL_COMMUNITY): Payer: Self-pay | Admitting: Family Medicine

## 2021-10-21 ENCOUNTER — Inpatient Hospital Stay (HOSPITAL_COMMUNITY): Payer: Medicaid Other | Admitting: Anesthesiology

## 2021-10-21 DIAGNOSIS — O368130 Decreased fetal movements, third trimester, not applicable or unspecified: Secondary | ICD-10-CM | POA: Diagnosis not present

## 2021-10-21 DIAGNOSIS — Z3480 Encounter for supervision of other normal pregnancy, unspecified trimester: Secondary | ICD-10-CM | POA: Diagnosis not present

## 2021-10-21 DIAGNOSIS — Z3A41 41 weeks gestation of pregnancy: Secondary | ICD-10-CM | POA: Diagnosis not present

## 2021-10-21 DIAGNOSIS — D649 Anemia, unspecified: Secondary | ICD-10-CM | POA: Diagnosis not present

## 2021-10-21 DIAGNOSIS — O99820 Streptococcus B carrier state complicating pregnancy: Secondary | ICD-10-CM | POA: Diagnosis not present

## 2021-10-21 DIAGNOSIS — O480 Post-term pregnancy: Secondary | ICD-10-CM | POA: Diagnosis not present

## 2021-10-21 DIAGNOSIS — Z3A40 40 weeks gestation of pregnancy: Secondary | ICD-10-CM | POA: Diagnosis not present

## 2021-10-21 DIAGNOSIS — O9902 Anemia complicating childbirth: Secondary | ICD-10-CM | POA: Diagnosis not present

## 2021-10-21 LAB — TYPE AND SCREEN
ABO/RH(D): O POS
Antibody Screen: NEGATIVE

## 2021-10-21 LAB — RPR: RPR Ser Ql: NONREACTIVE

## 2021-10-21 MED ORDER — PHENYLEPHRINE 40 MCG/ML (10ML) SYRINGE FOR IV PUSH (FOR BLOOD PRESSURE SUPPORT)
80.0000 ug | PREFILLED_SYRINGE | INTRAVENOUS | Status: DC | PRN
  Filled 2021-10-21: qty 10

## 2021-10-21 MED ORDER — ONDANSETRON HCL 4 MG/2ML IJ SOLN: 4.0000 mg | INTRAMUSCULAR | Status: DC | PRN

## 2021-10-21 MED ORDER — WITCH HAZEL-GLYCERIN EX PADS: 1.0000 "application " | MEDICATED_PAD | CUTANEOUS | Status: DC | PRN

## 2021-10-21 MED ORDER — DIPHENHYDRAMINE HCL 25 MG PO CAPS: 25.0000 mg | ORAL_CAPSULE | Freq: Four times a day (QID) | ORAL | Status: DC | PRN

## 2021-10-21 MED ORDER — SENNOSIDES-DOCUSATE SODIUM 8.6-50 MG PO TABS
2.0000 | ORAL_TABLET | Freq: Every day | ORAL | Status: DC
  Administered 2021-10-22 – 2021-10-23 (×2): 2 via ORAL
  Filled 2021-10-21 (×2): qty 2

## 2021-10-21 MED ORDER — LIDOCAINE HCL (PF) 1 % IJ SOLN
INTRAMUSCULAR | Status: DC | PRN
  Administered 2021-10-21: 9 mL via EPIDURAL

## 2021-10-21 MED ORDER — PHENYLEPHRINE 40 MCG/ML (10ML) SYRINGE FOR IV PUSH (FOR BLOOD PRESSURE SUPPORT): 80.0000 ug | PREFILLED_SYRINGE | INTRAVENOUS | Status: DC | PRN

## 2021-10-21 MED ORDER — LACTATED RINGERS IV SOLN
500.0000 mL | Freq: Once | INTRAVENOUS | Status: AC
  Administered 2021-10-21: 500 mL via INTRAVENOUS

## 2021-10-21 MED ORDER — IBUPROFEN 600 MG PO TABS
600.0000 mg | ORAL_TABLET | Freq: Four times a day (QID) | ORAL | Status: DC
  Administered 2021-10-21 – 2021-10-23 (×6): 600 mg via ORAL
  Filled 2021-10-21 (×6): qty 1

## 2021-10-21 MED ORDER — DIPHENHYDRAMINE HCL 50 MG/ML IJ SOLN: 12.5000 mg | INTRAMUSCULAR | Status: DC | PRN

## 2021-10-21 MED ORDER — SIMETHICONE 80 MG PO CHEW: 80.0000 mg | CHEWABLE_TABLET | ORAL | Status: DC | PRN

## 2021-10-21 MED ORDER — EPHEDRINE 5 MG/ML INJ: 10.0000 mg | INTRAVENOUS | Status: DC | PRN

## 2021-10-21 MED ORDER — DIBUCAINE (PERIANAL) 1 % EX OINT: 1.0000 "application " | TOPICAL_OINTMENT | CUTANEOUS | Status: DC | PRN

## 2021-10-21 MED ORDER — ACETAMINOPHEN 325 MG PO TABS: 650.0000 mg | ORAL_TABLET | ORAL | Status: DC | PRN

## 2021-10-21 MED ORDER — COCONUT OIL OIL
1.0000 "application " | TOPICAL_OIL | Status: DC | PRN
  Administered 2021-10-23: 1 via TOPICAL

## 2021-10-21 MED ORDER — EPHEDRINE 5 MG/ML INJ
10.0000 mg | INTRAVENOUS | Status: DC | PRN
  Administered 2021-10-21: 10 mg via INTRAVENOUS
  Filled 2021-10-21: qty 5

## 2021-10-21 MED ORDER — ZOLPIDEM TARTRATE 5 MG PO TABS: 5.0000 mg | ORAL_TABLET | Freq: Every evening | ORAL | Status: DC | PRN

## 2021-10-21 MED ORDER — FENTANYL-BUPIVACAINE-NACL 0.5-0.125-0.9 MG/250ML-% EP SOLN
12.0000 mL/h | EPIDURAL | Status: DC | PRN
  Administered 2021-10-21: 12 mL/h via EPIDURAL
  Filled 2021-10-21: qty 250

## 2021-10-21 MED ORDER — ONDANSETRON HCL 4 MG PO TABS: 4.0000 mg | ORAL_TABLET | ORAL | Status: DC | PRN

## 2021-10-21 MED ORDER — PRENATAL MULTIVITAMIN CH
1.0000 | ORAL_TABLET | Freq: Every day | ORAL | Status: DC
  Administered 2021-10-21 – 2021-10-22 (×2): 1 via ORAL
  Filled 2021-10-21 (×2): qty 1

## 2021-10-21 MED ORDER — BENZOCAINE-MENTHOL 20-0.5 % EX AERO
1.0000 "application " | INHALATION_SPRAY | CUTANEOUS | Status: DC | PRN
  Administered 2021-10-21: 1 via TOPICAL
  Filled 2021-10-21: qty 56

## 2021-10-21 MED ORDER — TETANUS-DIPHTH-ACELL PERTUSSIS 5-2.5-18.5 LF-MCG/0.5 IM SUSY: 0.5000 mL | PREFILLED_SYRINGE | Freq: Once | INTRAMUSCULAR | Status: DC

## 2021-10-21 NOTE — Plan of Care (Signed)
Pt demonstrated understanding 

## 2021-10-21 NOTE — Anesthesia Preprocedure Evaluation (Signed)
Anesthesia Evaluation  Patient identified by MRN, date of birth, ID band Patient awake    Reviewed: Allergy & Precautions, H&P , Patient's Chart, lab work & pertinent test results  Airway Mallampati: III       Dental no notable dental hx.    Pulmonary neg pulmonary ROS,    Pulmonary exam normal        Cardiovascular negative cardio ROS Normal cardiovascular exam     Neuro/Psych negative neurological ROS  negative psych ROS   GI/Hepatic negative GI ROS, Neg liver ROS,   Endo/Other  negative endocrine ROS  Renal/GU negative Renal ROS  negative genitourinary   Musculoskeletal negative musculoskeletal ROS (+)   Abdominal Normal abdominal exam  (+)   Peds  Hematology  (+) anemia ,   Anesthesia Other Findings   Reproductive/Obstetrics (+) Pregnancy                             Anesthesia Physical  Anesthesia Plan  ASA: II  Anesthesia Plan: Epidural   Post-op Pain Management:    Induction:   PONV Risk Score and Plan:   Airway Management Planned:   Additional Equipment:   Intra-op Plan:   Post-operative Plan:   Informed Consent: I have reviewed the patients History and Physical, chart, labs and discussed the procedure including the risks, benefits and alternatives for the proposed anesthesia with the patient or authorized representative who has indicated his/her understanding and acceptance.       Plan Discussed with:   Anesthesia Plan Comments:         Anesthesia Quick Evaluation

## 2021-10-21 NOTE — Progress Notes (Signed)
LABOR PROGRESS NOTE  Catherine Nixon is a 21 y.o. G2P1001 at [redacted]w[redacted]d  admitted for IOL d/t DFM  Subjective: Pt is doing well. Would prefer to have one more dose of Cytotec before placing foley balloon.  Objective: BP 114/64    Pulse 62    Temp 98.4 F (36.9 C)    Resp 20    Wt 68.7 kg    LMP 01/10/2021 (Exact Date)    SpO2 98%    BMI 30.58 kg/m  or  Vitals:   10/20/21 1749 10/20/21 1751 10/21/21 0044  BP:  118/62 114/64  Pulse:  86 62  Resp:  20   Temp:  98.4 F (36.9 C) 98.4 F (36.9 C)  TempSrc:  Oral   SpO2:  98%   Weight: 68.7 kg       Dilation: 1.5 Effacement (%): 50 Station: -3 Presentation: Vertex Exam by:: Dr.Beard FHT: baseline rate 120, moderate varibility, accels present, no decels Toco: irregular ctx  Labs: Lab Results  Component Value Date   WBC 7.6 10/20/2021   HGB 9.8 (L) 10/20/2021   HCT 31.9 (L) 10/20/2021   MCV 80.4 10/20/2021   PLT 320 10/20/2021    Patient Active Problem List   Diagnosis Date Noted   Post term pregnancy at [redacted] weeks gestation 10/20/2021   Language barrier affecting health care 09/13/2021   Preterm uterine contractions in third trimester, antepartum 08/17/2021   Supervision of other normal pregnancy, antepartum 04/18/2021   GBS bacteriuria 04/07/2021    Assessment / Plan: 21 y.o. G2P1001 at [redacted]w[redacted]d here for IOL d/t DFM  Labor: No cervical change since last check. Will give second dose of Cytotec with plan to place foley balloon at next check Fetal Wellbeing:  cat 1 Pain Control:  plan for epidural  Anticipated MOD:  SVD  Erick Alley PGY1, family medicine resident  10/21/2021, 1:04 AM

## 2021-10-21 NOTE — Lactation Note (Addendum)
This note was copied from a baby's chart. Lactation Consultation Note  Patient Name: Catherine Nixon 21 GBTDV'V Date: 10/21/2021 Reason for consult: Initial assessment;Mother's request;Difficult latch;Term;Breastfeeding assistance Age: 21  Mom breastfed first child for 6 months. Currently with this pregnancy noted did not have leaking breast milk like before and did not note any breast changes.  LC arrival Mother states infant last fed at 11 am for 10 min. Since then, states not able to wake for a feeding. LC removed clothing, placed her STS attempted latch, not opening wide enough. LC then tried spoon feeding, infant not bringing out her tongue. Infant not taking bottle.  LC alerted RN Maralyn Sago Rifey. Infant not lifting tongue to see if there is a lingual attachment.  Mom keeping infant STS and working on latch.   Plan 1. To feed based on cues 8-12x 24hr period. Mom to offer breasts and look for signs of milk transfer 2. Mom to supplement with slow flow nipple and pace bottle feeding 5-7 ml per feeding.  3. I and O sheet reviewed  4. [facility] brochure provided.  All questions answered at the end of the visit.   Maternal Data Has patient been taught Hand Expression?: Yes Does the patient have breastfeeding experience prior to this delivery?: Yes How long did the patient breastfeed?: 6 months breast and bottle with formula  Feeding Mother's Current Feeding Choice: Breast Milk  LATCH Score                    Lactation Tools Discussed/Used Tools: Pump;Flanges Flange Size: 24 Breast pump type: Manual Pump Education: Setup, frequency, and cleaning;Milk Storage Reason for Pumping: elongate nipples Pumping frequency: pre pump 5-10 min before latching  Interventions Interventions: Breast feeding basics reviewed;Assisted with latch;Skin to skin;Breast massage;Hand express;Pre-pump if needed;Breast compression;Support pillows;Position options;Expressed milk;Hand pump;Education;LC  Psychologist, educational;Infant Driven Feeding Algorithm education  Discharge Pump: Manual WIC Program: Yes  Consult Status Consult Status: Follow-up Date: 10/22/21 Follow-up type: In-patient    Catherine Beane  Nixon 10/21/2021, 6:08 PM

## 2021-10-21 NOTE — Discharge Summary (Addendum)
Postpartum Discharge Summary  Date of Service updated: 10/23/2021    Patient Name: Catherine Nixon DOB: 30-Apr-2001 MRN: [id number]  Date of admission: 10/20/2021 Delivery date:10/21/2021  Delivering provider: Renee Harder  Date of discharge: 10/23/2021  Admitting diagnosis: Post term pregnancy at [redacted] weeks gestation [O48.0, Z3A.41] Intrauterine pregnancy: [redacted]w[redacted]d    Secondary diagnosis:  Principal Problem:   SVD (spontaneous vaginal delivery) Active Problems:   GBS bacteriuria   Supervision of other normal pregnancy, antepartum   Language barrier affecting health care   Post term pregnancy at [redacted] weeks gestation   Postpartum anemia (iron deficiency)  Additional problems: None    Discharge diagnosis: Term Pregnancy Delivered                                              Post partum procedures: None Augmentation: Cytotec Complications: None  Hospital course: Induction of Labor With Vaginal Delivery   21y.o. yo G2P1001 at 458w4das admitted to the hospital 10/20/2021 for induction of labor.  Indication for induction:  Decreased fetal movement .  Patient had an uncomplicated labor course as follows: Membrane Rupture Time/Date: 6:56 AM ,10/21/2021   Delivery Method:Vaginal, Spontaneous  Episiotomy: None  Lacerations:  None  Details of delivery can be found in separate delivery note.  Patient had a routine postpartum course and is meeting all milestones. Patient is discharged home 10/23/21.  Newborn Data: Birth date:10/21/2021  Birth time:8:35 AM  Gender:Female  Living status:Living  Apgars:8 ,9  Weight:2950 g   Magnesium Sulfate received: No BMZ received: No Rhophylac: N/A MMR: N/A T-DaP: Offered prior to discharge Flu: No Transfusion: No  Physical exam  Vitals:   10/22/21 0419 10/22/21 1700 10/22/21 2345 10/23/21 0521  BP: (!) 103/54 (!) 102/57 115/62 112/65  Pulse: 62 (!) 55 64 70  Resp: 18 18    Temp: 98.1 F (36.7 C) 98.8 F (37.1 C)    TempSrc: Oral      SpO2:      Weight:       General: well-appearing, alert, cooperative, and no distress with newborn laying on chest Lochia: appropriate Uterine Fundus: firm and below umbilicus  DVT Evaluation: No evidence of DVT seen on physical exam. Negative Homan's sign. No cords or calf tenderness. No significant calf/ankle edema.  Labs: Lab Results  Component Value Date   WBC 7.6 10/20/2021   HGB 9.8 (L) 10/20/2021   HCT 31.9 (L) 10/20/2021   MCV 80.4 10/20/2021   PLT 320 10/20/2021   CMP Latest Ref Rng & Units 08/17/2021  Glucose 70 - 99 mg/dL 91  BUN 6 - 20 mg/dL 7  Creatinine 0.57 - 1.00 mg/dL 0.60  Sodium 134 - 144 mmol/L 138  Potassium 3.5 - 5.2 mmol/L 3.4(L)  Chloride 96 - 106 mmol/L 103  CO2 20 - 29 mmol/L 23  Calcium 8.7 - 10.2 mg/dL 8.5(L)  Total Protein 6.0 - 8.5 g/dL 5.8(L)  Total Bilirubin 0.0 - 1.2 mg/dL <0.2  Alkaline Phos 44 - 121 IU/L 138(H)  AST 0 - 40 IU/L 14  ALT 0 - 32 IU/L 23   Edinburgh Score: Edinburgh Postnatal Depression Scale Screening Tool 10/22/2021  I have been able to laugh and see the funny side of things. 0  I have looked forward with enjoyment to things. 0  I have blamed myself unnecessarily when things went  wrong. 0  I have been anxious or worried for no good reason. 2  I have felt scared or panicky for no good reason. 0  Things have been getting on top of me. 0  I have been so unhappy that I have had difficulty sleeping. 0  I have felt sad or miserable. 0  I have been so unhappy that I have been crying. 0  The thought of harming myself has occurred to me. 0  Edinburgh Postnatal Depression Scale Total 2     After visit meds:  Allergies as of 10/23/2021   No Known Allergies      Medication List     STOP taking these medications    terconazole 0.4 % vaginal cream Commonly known as: TERAZOL 7       TAKE these medications    acetaminophen 500 MG tablet Commonly known as: TYLENOL Take 2 tablets (1,000 mg total) by mouth every 8  (eight) hours as needed (pain).   Blood Pressure Monitor Automat Devi 1 Device by Does not apply route daily. Automatic blood pressure cuff regular size. To monitor blood pressure regularly at home. ICD-10 code:Z34.90   ferrous sulfate 325 (65 FE) MG EC tablet Take 1 tablet (325 mg total) by mouth every other day. What changed: when to take this   Gojji Weight Scale Misc 1 Device by Does not apply route daily as needed. To weight self daily as needed at home. ICD-10 code: Z34.90   ibuprofen 600 MG tablet Commonly known as: ADVIL Take 1 tablet (600 mg total) by mouth every 6 (six) hours as needed.   Prenatal Complete 14-0.4 MG Tabs Take 1 tablet by mouth daily.         Discharge home in stable condition Infant Feeding: Breast Infant Disposition: home with mother Discharge instruction: per After Visit Summary and Postpartum booklet. Activity: Advance as tolerated. Pelvic rest for 6 weeks.  Diet: routine diet  Follow up Visit: Message sent to [facility] for PP visit  Please schedule this patient for a In person postpartum visit in 4 weeks with the following provider: Any provider. Additional Postpartum F/U: none   Low risk pregnancy complicated by:  none Delivery mode:  Vaginal, Spontaneous  Anticipated Birth Control:  IUD outpatient  10/23/2021 [name], MD  GME ATTESTATION:  I saw and evaluated the patient. I agree with the findings and the plan of care as documented in the residents note. I have made changes to documentation as necessary.  Progressing well postpartum. Meeting all goals. Will continue ferrous sulfate for iron deficiency anemia. Plans for IUD outpatient for contraception. Stable for discharge.   Spanish interpreter via [employer] interpreter used for entirety of visit [phone], [name]).  [name], MD OB Fellow, [place] for [place] 10/23/2021 9:06 AM

## 2021-10-21 NOTE — Anesthesia Postprocedure Evaluation (Signed)
Anesthesia Post Note  Patient: Catherine Nixon  Procedure(s) Performed: AN AD HOC LABOR EPIDURAL     Patient location during evaluation: Mother Baby Anesthesia Type: Epidural Level of consciousness: awake Pain management: satisfactory to patient Vital Signs Assessment: post-procedure vital signs reviewed and stable Respiratory status: spontaneous breathing Cardiovascular status: stable Anesthetic complications: no   No notable events documented.  Last Vitals:  Vitals:   10/21/21 1204 10/21/21 1452  BP: 104/68 (!) 97/58  Pulse: 74 63  Resp: 18   Temp: 36.7 C 37.1 C  SpO2:  99%    Last Pain:  Vitals:   10/21/21 1510  TempSrc:   PainSc: 0-No pain   Pain Goal: Patients Stated Pain Goal: 8 (10/20/21 2016)                 Casimer Lanius

## 2021-10-21 NOTE — Anesthesia Procedure Notes (Signed)
Epidural Patient location during procedure: OB Start time: 10/21/2021 5:29 AM End time: 10/21/2021 5:32 AM  Staffing Anesthesiologist: Lyn Hollingshead, MD Performed: anesthesiologist   Preanesthetic Checklist Completed: patient identified, IV checked, site marked, risks and benefits discussed, surgical consent, monitors and equipment checked, pre-op evaluation and timeout performed  Epidural Patient position: sitting Prep: DuraPrep and site prepped and draped Patient monitoring: continuous pulse ox and blood pressure Approach: midline Location: L3-L4 Injection technique: LOR air  Needle:  Needle type: Tuohy  Needle gauge: 17 G Needle length: 9 cm and 9 Needle insertion depth: 5 cm cm Catheter type: closed end flexible Catheter size: 19 Gauge Catheter at skin depth: 10 cm Test dose: negative and Other  Assessment Events: blood not aspirated, injection not painful, no injection resistance, no paresthesia and negative IV test

## 2021-10-22 ENCOUNTER — Encounter (HOSPITAL_COMMUNITY): Payer: Self-pay | Admitting: Family Medicine

## 2021-10-22 DIAGNOSIS — O9081 Anemia of the puerperium: Secondary | ICD-10-CM | POA: Diagnosis present

## 2021-10-22 MED ORDER — FERROUS SULFATE 325 (65 FE) MG PO TABS
325.0000 mg | ORAL_TABLET | ORAL | Status: DC
  Administered 2021-10-22: 325 mg via ORAL
  Filled 2021-10-22: qty 1

## 2021-10-22 MED ORDER — LIDOCAINE HCL 1 % IJ SOLN: 0.0000 mL | Freq: Once | INTRAMUSCULAR | Status: DC | PRN

## 2021-10-22 MED ORDER — ETONOGESTREL 68 MG ~~LOC~~ IMPL: 68.0000 mg | DRUG_IMPLANT | Freq: Once | SUBCUTANEOUS | Status: DC

## 2021-10-22 NOTE — Progress Notes (Signed)
Post Partum Day 1  Patient is Spanish-speaking only, interpreter present for this encounter.  Subjective: No complaints, up ad lib, voiding, tolerating PO, and + flatus. Baby is stable at bedside. Breastfeeding.   Objective: Blood pressure (!) 103/54, pulse 62, temperature 98.1 F (36.7 C), temperature source Oral, resp. rate 18, weight 68.7 kg, last menstrual period 01/10/2021, SpO2 100 %, unknown if currently breastfeeding.  Physical Exam:  General: alert and no distress Lochia: appropriate Uterine Fundus: firm DVT Evaluation: No evidence of DVT seen on physical exam. Negative Homan's sign. No cords or calf tenderness. No significant calf/ankle edema.  Recent Labs    10/20/21 1914  HGB 9.8*  HCT 31.9*    Assessment/Plan: Plan for discharge tomorrow, Breastfeeding, and Contraception Nexplanon IP vs IUD OP Counseled in detail about the different IUDs and Nexplanon, she will let us know if she wants Nexplanon prior to discharge. Printed information (in Spanish) given to her to review. Oral iron therapy given for mild postpartum anemia, acute on chronic anemia. Routine postpartum care.   LOS: 2 days   Jaynie Collins, MD 10/22/2021, 11:22 AM

## 2021-10-22 NOTE — Lactation Note (Signed)
This note was copied from a baby's chart. Lactation Consultation Note  Patient Name: Catherine Nixon FYBOF'B Date: 10/22/2021   Age:22 hours   LC Note:  Previous LC informed me that mother did not desire a lactation consult until day time on Sunday, January 15.   Maternal Data    Feeding Nipple Type: Nfant Standard Flow (white)  LATCH Score Latch: Repeated attempts needed to sustain latch, nipple held in mouth throughout feeding, stimulation needed to elicit sucking reflex.  Audible Swallowing: None  Type of Nipple: Inverted  Comfort (Breast/Nipple): Soft / non-tender  Hold (Positioning): Assistance needed to correctly position infant at breast and maintain latch.  LATCH Score: 4   Lactation Tools Discussed/Used    Interventions    Discharge    Consult Status      Cedra Villalon R Gracianna Vink 10/22/2021, 3:30 AM

## 2021-10-23 MED ORDER — ACETAMINOPHEN 325 MG PO TABS: 650.0000 mg | ORAL_TABLET | ORAL | Status: DC | PRN

## 2021-10-23 MED ORDER — IBUPROFEN 600 MG PO TABS: 600.0000 mg | ORAL_TABLET | Freq: Four times a day (QID) | ORAL | 0 refills | Status: DC | PRN

## 2021-10-23 MED ORDER — ACETAMINOPHEN 500 MG PO TABS: 1000.0000 mg | ORAL_TABLET | Freq: Three times a day (TID) | ORAL | 0 refills | Status: DC | PRN

## 2021-10-23 MED ORDER — FERROUS SULFATE 325 (65 FE) MG PO TBEC: 325.0000 mg | DELAYED_RELEASE_TABLET | ORAL | 3 refills | Status: DC

## 2021-10-23 NOTE — Lactation Note (Signed)
This note was copied from a baby's chart. Lactation Consultation Note  Patient Name: Catherine Nixon [id number] Date: 10/23/2021 Reason for consult: Follow-up assessment Age:23 hours  Spanish interpreter via video used. P2,Mother is breastfeeding and formula feeding. She states infant's latch has improved. Encouraged breastfeeding before offering formula to help establish her milk supply. Reviewed engorgement care and monitoring voids/stools.   Feeding Mother's Current Feeding Choice: Breast Milk and Formula   Lactation Tools Discussed/Used Tools: Pump  Interventions Interventions: Breast feeding basics reviewed;Hand pump;Education  Discharge  Reviewed engorgement care and monitoring voids/stools.   Consult Status Consult Status: Complete Date: 10/23/21    Vivianne Master [facility] 10/23/2021, 8:22 AM

## 2021-10-23 NOTE — Progress Notes (Signed)
Discharge instructions reviewed with patient via [employer] interpreter [phone], answered all questions and concerns, pt acknowledged instructions.

## 2021-10-24 ENCOUNTER — Inpatient Hospital Stay (HOSPITAL_COMMUNITY): Payer: Medicaid Other

## 2021-10-24 ENCOUNTER — Inpatient Hospital Stay (HOSPITAL_COMMUNITY): Admission: AD | Admit: 2021-10-24 | Payer: Medicaid Other | Source: Home / Self Care | Admitting: Family Medicine

## 2021-11-04 ENCOUNTER — Telehealth (HOSPITAL_COMMUNITY): Payer: Self-pay

## 2021-11-04 NOTE — Telephone Encounter (Signed)
°  No answer. Left message to return nurse call.  Marcelino Duster [facility] 11/04/2021,1132

## 2021-11-16 ENCOUNTER — Encounter: Payer: Self-pay | Admitting: Obstetrics and Gynecology

## 2021-11-16 ENCOUNTER — Ambulatory Visit (INDEPENDENT_AMBULATORY_CARE_PROVIDER_SITE_OTHER): Payer: Medicaid Other | Admitting: Obstetrics and Gynecology

## 2021-11-16 ENCOUNTER — Other Ambulatory Visit: Payer: Self-pay

## 2021-11-16 DIAGNOSIS — Z30430 Encounter for insertion of intrauterine contraceptive device: Secondary | ICD-10-CM | POA: Diagnosis not present

## 2021-11-16 DIAGNOSIS — Z789 Other specified health status: Secondary | ICD-10-CM | POA: Diagnosis not present

## 2021-11-16 MED ORDER — PARAGARD INTRAUTERINE COPPER IU IUD
1.0000 | INTRAUTERINE_SYSTEM | Freq: Once | INTRAUTERINE | Status: AC
  Administered 2021-11-16: 1 via INTRAUTERINE

## 2021-11-16 NOTE — Progress Notes (Signed)
Patient would like intrauterine device (Paragard) inserted today.

## 2021-11-16 NOTE — Progress Notes (Signed)
° ° °  IUD INSERTION PROCEDURE NOTE  Catherine Nixon is a 21 y.o. [id number] here for Paragard insertion. No GYN concerns.   She was counseled regarding the risks/benefits of IUD including insertion risk of infection, hemorrhage, damage to surrounding tissue and organs, uterine perforation. She was counseled regarding risks of IUD including implantation into uterine wall, migration outside of uterus, possible need for hysteroscopic or laparoscopic removal, ovarian cysts, expulsion. She was advised that risk of pregnancy is low with negative UPT but is not zero and IUD insertion may cause miscarriage. Reviewed that she is also at slightly higher risk for ectopic pregnancy and she should take a pregnancy test if she believes she may be pregnant. She was advised to use backup method of protection for one week. She verbalized understanding of all of the above and consent signed.   Last intercourse was ~4 weeks ago and unprotected Last pap smear was on 03/28/2020 and was Normal UPT today: Negative  IUD Insertion  Patient identified and an adequate time out was performed. Speculum placed in the vagina. The cervix was cleaned with Betadine x 2 and grasped anteriorly with a single tooth tenaculum.  A uterine sound was used to sound the uterus to 7 cm;  the IUD was then placed per manufacturer's recommendations. Strings trimmed to 3 cm. Tenaculum was removed, good hemostasis noted after use of silver nitrate swab and multiple large cotton-tipped swabs. Patient tolerated procedure well.   Patient was given post-procedure instructions.  She was reminded to have backup contraception for one week during this transition period between IUDs.  Patient was also asked to check IUD strings periodically and follow up in 4 weeks for IUD check.  Paragard IUD Exp: 01/2026 Lot: [id number]  Catherine Nixon, CNM 11/16/2021 12:00 PM

## 2021-11-16 NOTE — Progress Notes (Signed)
Post Partum Visit Note  Catherine Nixon is a 21 y.o. G83P2002 female who presents for a postpartum visit. She is  3.5  weeks postpartum following a normal spontaneous vaginal delivery.  I have fully reviewed the prenatal and intrapartum course. The delivery was at 40.4 gestational weeks.  Anesthesia: epidural. Postpartum course has been uncomplicated. Baby is doing well. Baby is feeding by both breast and bottle - Gerber Gentle . Bleeding staining only. Bowel function is normal. Bladder function is normal. Patient is not sexually active. Contraception method is none. Postpartum depression screening: negative.   The pregnancy intention screening data noted above was reviewed. Potential methods of contraception were discussed. The patient elected to proceed with No data recorded.   Edinburgh Postnatal Depression Scale - 11/16/21 1114       Edinburgh Postnatal Depression Scale:  In the Past 7 Days   I have been able to laugh and see the funny side of things. 0    I have looked forward with enjoyment to things. 0    I have blamed myself unnecessarily when things went wrong. 2    I have been anxious or worried for no good reason. 0    I have felt scared or panicky for no good reason. 0    Things have been getting on top of me. 1    I have been so unhappy that I have had difficulty sleeping. 0    I have felt sad or miserable. 0    I have been so unhappy that I have been crying. 0    The thought of harming myself has occurred to me. 0    Edinburgh Postnatal Depression Scale Total 3             Health Maintenance Due  Topic Date Due   COVID-19 Vaccine (1) Never done   HPV VACCINES (1 - 2-dose series) Never done    The following portions of the patient's history were reviewed and updated as appropriate: allergies, current medications, past family history, past medical history, past social history, past surgical history, and problem list.  Review of Systems Constitutional:  negative Eyes: negative Ears, nose, mouth, throat, and face: negative Respiratory: negative Cardiovascular: negative Gastrointestinal: negative Genitourinary:positive for spotting Integument/breast: negative Hematologic/lymphatic: negative Musculoskeletal:negative Neurological: negative Behavioral/Psych: negative Endocrine: negative Allergic/Immunologic: negative  Objective:  There were no vitals taken for this visit.   General:  alert, cooperative, appears stated age, and no distress   Breasts:  normal  Lungs: clear to auscultation bilaterally  Heart:  regular rate and rhythm, S1, S2 normal, no murmur, click, rub or gallop  Abdomen: soft, non-tender; bowel sounds normal; no masses,  no organomegaly   Wound N/a  GU exam:   Normal GU exam -- see IUD insertion procedural note       Assessment:   Encounter for routine postpartum follow-up - Normal postpartum exam.   Encounter for insertion of intrauterine contraceptive device (IUD)    Encounter for IUD insertion  - paragard intrauterine copper IUD 1 each  Language barrier affecting health care - [employer] Video Spanish Carlean Purl [phone] used for entire visit    Plan:   Essential components of care per ACOG recommendations:  1.  Mood and well being: Patient with negative depression screening today. Reviewed local resources for support.  - Patient tobacco use? No.   - hx of drug use? No.    2. Infant care and feeding:  -Patient currently breastmilk feeding?  Yes. Discussed returning to work and pumping. Reviewed importance of draining breast regularly to support lactation.  -Social determinants of health (SDOH) reviewed in EPIC. No concerns  3. Sexuality, contraception and birth spacing - Patient does not want a pregnancy in the next year.  Desired family size is 2 children.  - Reviewed forms of contraception in tiered fashion. Patient desired Paragard IUD today.   - Discussed birth spacing of  18 months  4. Sleep and fatigue -Encouraged family/partner/community support of 4 hrs of uninterrupted sleep to help with mood and fatigue  5. Physical Recovery  - Discussed patients delivery and complications. She describes her labor as good. - Patient had a Vaginal, no problems at delivery. Patient had no laceration. Perineal healing reviewed. Patient expressed understanding - Patient has urinary incontinence? No. - Patient is safe to resume physical and sexual activity  6.  Health Maintenance - HM due items addressed Yes - Last pap smear  Diagnosis  Date Value Ref Range Status  03/28/2020   Final   - Negative for intraepithelial lesion or malignancy (NILM)   Pap smear not done at today's visit.  -Breast Cancer screening indicated? No.   7. Chronic Disease/Pregnancy Condition follow up: None  - PCP follow up  Raelyn Mora, CNM Center for [employer], [facility]

## 2021-11-16 NOTE — Patient Instructions (Signed)
°  INSTRUCCIONES POSTERIORES AL PROCEDIMIENTO PARA LA COLOCACIN DEL DIU  1. Puede tomar ibuprofeno, Aleve o Tylenol para el dolor si es necesario. Los calambres deben resolverse en 24 horas.  2. Es posible que tenga una pequea cantidad de [place]. Debe usar Furniture conservator/restorer mini [employer] prximos das.  3. Puede tener relaciones despus de 24 horas. Si [place] esto para el control de la natalidad, es efectivo de inmediato.  4. Debe llamar si tiene dolor plvico, fiebre, sangrado abundante o secrecin vaginal maloliente. El sangrado irregular es comn durante los primeros meses despus de la colocacin de un DIU. No necesita llamar por este motivo a menos que est preocupado.  5. Dchese o bese normalmente  6. Debe tener una cita de seguimiento en 4 a 8 semanas para una nueva revisin y asegurarse de que no tenga ningn problema.

## 2021-11-23 ENCOUNTER — Ambulatory Visit: Payer: Medicaid Other | Admitting: Obstetrics and Gynecology

## 2021-12-14 ENCOUNTER — Encounter: Payer: Self-pay | Admitting: Obstetrics & Gynecology

## 2021-12-14 ENCOUNTER — Ambulatory Visit: Payer: Medicaid Other | Admitting: Obstetrics & Gynecology

## 2021-12-14 ENCOUNTER — Other Ambulatory Visit: Payer: Self-pay

## 2021-12-14 VITALS — BP 103/56 | HR 64 | Wt 138.0 lb

## 2021-12-14 DIAGNOSIS — Z30431 Encounter for routine checking of intrauterine contraceptive device: Secondary | ICD-10-CM

## 2021-12-14 NOTE — Progress Notes (Signed)
? ?  GYN VISIT ?Patient name: Catherine Nixon MRN [id number]  Date of birth: 2001-09-16 ?Chief Complaint:   ?Follow-up (IUD String Check) ? ?History of Present Illness:   ?Catherine Nixon is a 21 y.o. G81P2002  female being seen today for IUD follow-up- paragard placed at [facility] on 11/16/21.  Today she notes overall ok.   ? ?Not yet started a period as she is breastfeeding.  Notes some mild cramping and occasional headache.  Overall happy with the device.  Denies irregular discharge, odor, itching or bleeding. ? ?Spanish interpreter present for the entire visit ? ?No LMP recorded. (Menstrual status: IUD). ? ?Depression screen [facility] 2/9 07/28/2021 05/17/2021 05/10/2021 02/02/2021 01/04/2021  ?Decreased Interest 0 0 1 0 0  ?Down, Depressed, Hopeless 0 0 3 0 0  ?PHQ - 2 Score 0 0 4 0 0  ?Altered sleeping 2 0 2 - -  ?Tired, decreased energy 2 2 0 - -  ?Change in appetite 0 0 0 - -  ?Feeling bad or failure about yourself  0 0 2 - -  ?Trouble concentrating 2 0 0 - -  ?Moving slowly or fidgety/restless 0 0 0 - -  ?Suicidal thoughts 0 0 0 - -  ?PHQ-9 Score 6 2 8  - -  ?Difficult doing work/chores Not difficult at all Not difficult at all Somewhat difficult - -  ? ? ? ?Review of Systems:   ?Pertinent items are noted in HPI ?Denies fever/chills, dizziness, headaches, visual disturbances, fatigue, shortness of breath, chest pain, abdominal pain, vomiting, bowel movements, urination, or intercourse unless otherwise stated above.  ?Pertinent History Reviewed:  ?Reviewed past medical,surgical, social, obstetrical and family history.  ?Reviewed problem list, medications and allergies. ?Physical Assessment:  ? ?Vitals:  ? 12/14/21 0959  ?BP: (!) 103/56  ?Pulse: 64  ?Weight: 138 lb (62.6 kg)  ?Body mass index is 27.87 kg/m?. ? ?     Physical Examination:  ? General appearance: alert, well appearing, and in no distress ? Psych: mood appropriate, normal affect ? Skin: warm & dry  ? Cardiovascular: normal heart rate noted ? Respiratory: normal  respiratory effort, no distress ? Abdomen: soft, non-tender  ? Pelvic: normal external genitalia, vulva, vagina, cervix- strings visualized at os, uterus and adnexa ? Extremities: no edema  ? ?Chaperone:  Interpreter present    ? ?Assessment & Plan:  ?1) IUD check ?-device appears in proper location ?-encouraged self checks ? ?F/U in 52yr for annual ? ? ?No orders of the defined types were placed in this encounter. ? ? ?No follow-ups on file. ? ? ?3yr, DO ?Attending Obstetrician & Gynecologist, Faculty Practice ?[facility], [facility] ? ? ? ?

## 2021-12-26 ENCOUNTER — Ambulatory Visit (INDEPENDENT_AMBULATORY_CARE_PROVIDER_SITE_OTHER): Payer: Self-pay | Admitting: *Deleted

## 2021-12-26 ENCOUNTER — Telehealth: Payer: Medicaid Other | Admitting: Physician Assistant

## 2021-12-26 DIAGNOSIS — J039 Acute tonsillitis, unspecified: Secondary | ICD-10-CM

## 2021-12-26 MED ORDER — AMOXICILLIN 500 MG PO CAPS: 500.0000 mg | ORAL_CAPSULE | Freq: Two times a day (BID) | ORAL | 0 refills | Status: AC

## 2021-12-26 NOTE — Telephone Encounter (Signed)
FYI

## 2021-12-26 NOTE — Patient Instructions (Addendum)
?Catherine Nixon, thank you for joining [name], PA-C for today's virtual visit.  While this provider is not your primary care provider (PCP), if your PCP is located in our provider database this encounter information will be shared with them immediately following your visit. ? ?Consent: ?(Patient) [name] provided verbal consent for this virtual visit at the beginning of the encounter. ? ?Current Medications: ? ?Current Outpatient Medications:  ?  acetaminophen (TYLENOL) 500 MG tablet, Take 2 tablets (1,000 mg total) by mouth every 8 (eight) hours as needed (pain)., Disp: 60 tablet, Rfl: 0 ?  PARAGARD INTRAUTERINE COPPER IU, by Intrauterine route., Disp: , Rfl:   ? ?Medications ordered in this encounter:  ?No orders of the defined types were placed in this encounter. ?  ? ?*If you need refills on other medications prior to your next appointment, please contact your pharmacy* ? ?Follow-Up: ?Call back or seek an in-person evaluation if the symptoms worsen or if the condition fails to improve as anticipated. ? ?Other Instructions ?Faringitis estreptoc?cica, en adultos ?Strep Throat, Adult ?La faringitis estreptoc?cica es una infecci?n en la garganta causada por bacterias. Es frecuente [employer] meses de fr?o del a?o. Afecta principalmente a los ni?os que tienen entre 5 y 48 a?os. Sin embargo, las personas de todas las edades pueden contagiarse en cualquier momento del a?o. La infecci?n se transmite de [place] persona a otra (es contagiosa) a trav?s de la tos, el estornudo o el contacto cercano. ?El m?dico puede usar otras palabras para describir la infecci?n. Cuando la faringitis estreptoc?cica afecta las am?gdalas, se denomina amigdalitis. Cuando afecta la parte posterior de la garganta, se denomina faringitis. ??Cu?les son las causas? ?Esta afecci?n es provocada por las bacterias Streptococcus pyogenes. ??Qu? incrementa el riesgo? ?Es m?s probable que sufra esta afecci?n si: ?Cuida a ni?os en edad  escolar o est? cerca de ni?os en edad escolar. Los ni?os son m?s propensos a Programmer, applications y pueden transmit?rsela a Producer, television/film/video. ?Pasa tiempo en lugares en los que hay mucha gente, donde la infecci?n se puede diseminar f?cilmente. ?Tiene contacto cercano con alguien que tiene faringitis estreptoc?cica. ??Cu?les son los signos o s?ntomas? ?Los s?ntomas de esta afecci?n incluyen: ?Cristy Hilts o escalofr?os. ?Enrojecimiento, inflamaci?n o dolor de las am?gdalas o la garganta. ?Dolor o dificultad para tragar. ?Manchas blancas o amarillas en las am?gdalas o la garganta. ?Ganglios dolorosos al tacto en el cuello o debajo de la mand?bula. ?Mal aliento. ?Erupci?n roja en todo el cuerpo. Esto es poco frecuente. ??C?mo se diagnostica? ?Esta afecci?n se diagnostica mediante estudios para detectar la presencia y la cantidad de bacterias que causan la faringitis estreptoc?cica. Estos son: ?Prueba r?pida para estreptococos. Le pasan un hisopo por la garganta para extraer Truddie Coco y se comprueba la presencia de bacterias. Generalmente, los resultados est?n listos en cuesti?n de minutos. ?Cultivo de secreciones de Patent examiner. Le pasan un hisopo por la garganta para extraer Truddie Coco. La muestra se coloca en una taza que permite que las bacterias se reproduzcan. Generalmente, los resultados est?n listos en 1 o 2 d?as. ??C?mo se trata? ?El tratamiento de esta afecci?n puede incluir: ?Medicamentos que destruyen microbios (antibi?ticos). ?Medicamentos que [employer] o la fiebre. Estos incluyen: ?Ibuprofeno o acetaminofeno. ?Aspirina, solo para las [employer] de 18 a?os. ?Pastillas para la garganta. ?Aerosoles para Patent examiner. ?Siga estas indicaciones en su casa: ?Medicamentos ? ?Use los medicamentos de venta libre y los recetados solamente como se lo haya indicado el m?dico. ?Tome el antibi?tico como se  lo haya indicado el m?dico. No deje de tomar el antibi?tico aunque comience a sentirse  mejor. ?Comida y bebida ? ?Si tiene dificultad para tragar, intente consumir alimentos blandos hasta que el dolor de garganta mejore. ?Beba suficiente l?quido como para mantener la orina de color amarillo p?lido. ?Para aliviar el dolor, puede consumir lo siguiente: ?L?quidos calientes, como sopa y t?. ?L?quidos fr?os, como postres helados o helados de [place]. ?Indicaciones generales ?Haga g?rgaras con [employer] de agua y sal 3 o 4 veces al d?a, o cuando sea necesario. Para preparar la mezcla de agua y sal, disuelva totalmente de ? a 1 cucharadita (de 3 a 6 g) de sal en 1 taza (237 ml) de agua tibia. ?Descanse mucho. ?No concurra a la escuela o al [place] hasta que haya tomado los antibi?ticos durante 24 horas. ?No consuma ning?n producto que contenga nicotina o tabaco. Estos productos incluyen cigarrillos, tabaco para mascar y aparatos de vapeo, como los cigarrillos electr?nicos. Si necesita ayuda para dejar de fumar, consulte al m?dico. ?Es su responsabilidad retirar los [employer] de la prueba. Consulte al m?dico o pregunte en el departamento donde se realiza la prueba cu?ndo estar?n [employer]. ?Concurra a [place]. Esto es importante. ??C?mo se evita? ? ?No comparta alimentos, tazas ni art?culos personales que podr?an contagiar la infecci?n a Producer, television/film/video. ?L?vese las manos frecuentemente con agua y jab?n durante al menos 20 segundos. Use desinfectante para manos si no dispone de agua y jab?n. Aseg?rese de que todas las personas que viven en su casa se laven bien las manos. ?Haga que tambi?n se hagan los [employer] de la familia que tengan dolor de garganta o [place]. Pueden necesitar antibi?ticos si tienen faringitis estreptoc?cica. ?Comun?quese con un m?dico si: ?Tiene hinchaz?n en el cuello que se hace cada vez m?s grande. ?Aparece una erupci?n cut?nea, tos o dolor de o?dos. ?Tose y expectora una mucosidad espesa de color verde o amarillo amarronado, o con  [place]. ?Tiene dolor o molestias que no mejoran con medicamentos. ?Los s?ntomas Naval architect. ?Tiene fiebre. ?Solicite ayuda de inmediato si: ?Tiene s?ntomas nuevos, como v?mitos, dolor de cabeza intenso, rigidez o [employer] cuello, dolor en el pecho o falta de [place]. ?Le duele mucho la garganta, babea o tiene cambios en la visi?n. ?Siente que el cuello se le hincha o que la piel de esa zona se vuelve roja y sensible. ?Tiene signos de deshidrataci?n, como cansancio (fatiga), sequedad en la boca y disminuci?n de la micci?n. ?Comienza a sentir mucho sue?o, o no puede despertarse bien. ?Las articulaciones est?n enrojecidas o Agricultural engineer. ?Estos s?ntomas pueden representar un problema grave que constituye Engineer, maintenance (IT). No espere a ver si los s?ntomas desaparecen. Solicite atenci?n m?dica de inmediato. Comun?quese con el servicio de emergencias de su localidad (911 en los [place]). No conduzca por sus propios medios [employer]. ?Resumen ?La faringitis estreptoc?cica es una infecci?n en la garganta causada por las bacterias llamadas Streptococcus pyogenes. La infecci?n se transmite de [place] persona a otra (es contagiosa) a trav?s de la tos, el estornudo o el contacto cercano. ?[employer], incluidos los antibi?ticos, como se lo haya indicado el m?dico. No deje de tomar el antibi?tico aunque comience a sentirse mejor. ?Para evitar que se propaguen los microbios, l?vese bien las manos con agua y jab?n. P?dales a otras personas que tambi?n lo hagan. No comparta los alimentos, las tazas ni los art?culos personales. ?Busque ayuda de inmediato si tiene s?ntomas nuevos, como v?mitos,  dolor de cabeza intenso, rigidez o dolor en el cuello, dolor en el pecho o dificultad para respirar. ?Esta informaci?n no tiene Marine scientist el consejo del m?dico. Aseg?rese de hacerle al m?dico cualquier pregunta que tenga. ?Document Revised: 02/02/2021 Document Reviewed: 02/02/2021 ?Elsevier Patient Education ?  [place]. ? ? ?If you have been instructed to have an in-person evaluation today at a local Urgent Care facility, please use the link below. It will take you to a list of all of our available [place]

## 2021-12-26 NOTE — Telephone Encounter (Signed)
I returned pt's call.   Called in with cold symptoms.   Agent scheduled her with Gwinda Passe, NP for 12/28/2021. ? ?Used Spanish interpreter from [employer] [phone]  Katie ? ?Not able to leave message as message was,   "unable to receive calls right now". ? ? ?

## 2021-12-26 NOTE — Telephone Encounter (Signed)
?  Chief Complaint: Cold/cough/sore throat/low fever 100.5 ?Symptoms: IBID ?Frequency: 3 days ?Pertinent Negatives: Patient denies SOB high fever ?Disposition: [] ED /[x] Urgent Care (no appt availability in office) / [] Appointment(In office/virtual)/ []  [place] [facility]/ [] Home Care/ [] Refused Recommended Disposition /[] [facility]/ []  Follow-up with PCP ?Additional Notes: Pt is unable to make appt made with . Made virtual appt for pt for this afternoon. Pt has had S/S for past 3 days. Pt has a hx of infected tonsils. ? ? ?Reason for Disposition ? Fever present > 3 days (72 hours) ? ?Answer Assessment - Initial Assessment Questions ?1. ONSET: "When did the nasal discharge start?"  ?    yesterday ?2. AMOUNT: "How much discharge is there?"  ?    A little ?3. COUGH: "Do you have a cough?" If yes, ask: "Describe the color of your sputum" (clear, white, yellow, green) ?    Cough - 3 day ?4. RESPIRATORY DISTRESS: "Describe your breathing."  ?    no ?5. FEVER: "Do you have a fever?" If Yes, ask: "What is your temperature, how was it measured, and when did it start?" ?    yes ?6. SEVERITY: "Overall, how bad are you feeling right now?" (e.g., doesn't interfere with normal activities, staying home from school/work, staying in bed)  ?    sick ?7. OTHER SYMPTOMS: "Do you have any other symptoms?" (e.g., sore throat, earache, wheezing, vomiting) ?    Sore throat ?8. PREGNANCY: "Is there any chance you are pregnant?" "When was your last menstrual period?" ?    no ? ?Protocols used: Common Cold-A-AH ? ?

## 2021-12-26 NOTE — Progress Notes (Signed)
?Virtual Visit Consent  ? ?Catherine Nixon, you are scheduled for a virtual visit with a [facility] provider today.   ?  ?Just as with appointments in the office, your consent must be obtained to participate.  Your consent will be active for this visit and any virtual visit you may have with one of our providers in the next 365 days.   ?  ?If you have a MyChart account, a copy of this consent can be sent to you electronically.  All virtual visits are billed to your insurance company just like a traditional visit in the office.   ? ?As this is a virtual visit, video technology does not allow for your provider to perform a traditional examination.  This may limit your provider's ability to fully assess your condition.  If your provider identifies any concerns that need to be evaluated in person or the need to arrange testing (such as labs, EKG, etc.), we will make arrangements to do so.   ?  ?Although advances in technology are sophisticated, we cannot ensure that it will always work on either your end or our end.  If the connection with a video visit is poor, the visit may have to be switched to a telephone visit.  With either a video or telephone visit, we are not always able to ensure that we have a secure connection.    ? ?I need to obtain your verbal consent now.   Are you willing to proceed with your visit today?  ?  ?Catherine Nixon has provided verbal consent on 12/26/2021 for a virtual visit (video or telephone). ?  ?Piedad Climes, PA-C  ? ?Date: 12/26/2021 4:01 PM ? ? ?Virtual Visit via Video Note  ? ?IPiedad Climes, connected with  Catherine Nixon  ([id number], 2000-10-28) via [employer] interpretor on 12/26/21 at  3:45 PM EDT by a video-enabled telemedicine application and verified that I am speaking with the correct person using two identifiers. ? ?Location: ?Patient: Virtual Visit Location Patient: Home ?Provider: Virtual Visit Location Provider: Home Office ? ?Interpretor: [phone] through  [employer].  ?  ?I discussed the limitations of evaluation and management by telemedicine and the availability of in person appointments. The patient expressed understanding and agreed to proceed.   ? ?History of Present Illness: ?Catherine Nixon is a 21 y.o. who identifies as a female who was assigned female at birth, and is being seen today for possible tonsillitis. Interpretor present for the entirety of visit.  Patient endorses 2 days of sore throat, fever, aches, nasal congestion, now with exudate on tonsils bilaterally. Denies chest congestion or significant cough. Denies recent travel or sick contact. Has significant history of strep pharyngitis/tonsillitis since childhood. Averages 1-2 infections per year since adolescence.   Has recently given birth and is breastfeeding.  ? ? ? ? ? ?HPI: HPI  ?Problems:  ?Patient Active Problem List  ? Diagnosis Date Noted  ? Encounter for IUD insertion 11/16/2021  ? Encounter for routine postpartum follow-up 11/16/2021  ? Postpartum anemia 10/22/2021  ? Post term pregnancy at [redacted] weeks gestation 10/20/2021  ? Language barrier affecting health care 09/13/2021  ? Supervision of other normal pregnancy, antepartum 04/18/2021  ? GBS bacteriuria 04/07/2021  ? SVD (spontaneous vaginal delivery) 10/13/2017  ?  ?Allergies: No Known Allergies ?Medications:  ?Current Outpatient Medications:  ?  amoxicillin (AMOXIL) 500 MG capsule, Take 1 capsule (500 mg total) by mouth 2 (two) times daily for 10 days., Disp: 20 capsule, Rfl: 0 ?  PARAGARD INTRAUTERINE COPPER IU, by Intrauterine route., Disp: , Rfl:  ? ?Observations/Objective: ?Patient is well-developed, well-nourished in no acute distress.  ?Resting comfortably at home.  ?Head is normocephalic, atraumatic.  ?No labored breathing. ?Speech is clear and coherent with logical content.  ?Patient is alert and oriented at baseline.  ? ?Assessment and Plan: ?1. Tonsillitis ?- amoxicillin (AMOXIL) 500 MG capsule; Take 1 capsule (500 mg total)  by mouth 2 (two) times daily for 10 days.  Dispense: 20 capsule; Refill: 0 ? ?Recurrent issue for patient since adolescence. Will treat with Amoxicillin. Discussed safe for breastfeeding but reviewed things to look out for in the infant -- fussiness, decreased eating, loose stool. Supportive measures and safe OTC medications reviewed. Recommend discussing referral to ENT with PCP.  ? ?Follow Up Instructions: ?I discussed the assessment and treatment plan with the patient. The patient was provided an opportunity to ask questions and all were answered. The patient agreed with the plan and demonstrated an understanding of the instructions.  A copy of instructions were sent to the patient via MyChart unless otherwise noted below.  ? ?Patient has requested to receive PHI (AVS, Work Notes, etc) pertaining to this video visit through e-mail as they are currently without active MyChart. They have voiced understand that email is not considered secure and their health information could be viewed by someone other than the patient.  ? ?The patient was advised to call back or seek an in-person evaluation if the symptoms worsen or if the condition fails to improve as anticipated. ? ?Time:  ?I spent 10 minutes with the patient via telehealth technology discussing the above problems/concerns.   ? ?Piedad Climes, PA-C ?

## 2021-12-28 ENCOUNTER — Ambulatory Visit (INDEPENDENT_AMBULATORY_CARE_PROVIDER_SITE_OTHER): Payer: Medicaid Other | Admitting: Primary Care

## 2022-02-21 ENCOUNTER — Encounter (INDEPENDENT_AMBULATORY_CARE_PROVIDER_SITE_OTHER): Payer: Self-pay | Admitting: Primary Care

## 2022-02-21 ENCOUNTER — Ambulatory Visit (INDEPENDENT_AMBULATORY_CARE_PROVIDER_SITE_OTHER): Payer: Medicaid Other | Admitting: Primary Care

## 2022-02-21 VITALS — BP 116/79 | HR 56 | Temp 98.1°F | Ht 59.0 in | Wt 140.4 lb

## 2022-02-21 DIAGNOSIS — G245 Blepharospasm: Secondary | ICD-10-CM

## 2022-02-21 DIAGNOSIS — D649 Anemia, unspecified: Secondary | ICD-10-CM | POA: Diagnosis not present

## 2022-02-21 DIAGNOSIS — E876 Hypokalemia: Secondary | ICD-10-CM

## 2022-02-21 DIAGNOSIS — H539 Unspecified visual disturbance: Secondary | ICD-10-CM | POA: Diagnosis not present

## 2022-02-21 NOTE — Patient Instructions (Addendum)
Blefaroespasmo esencial benigno en los adultos ?Benign Essential Blepharospasm, Adult ?El blefaroespasmo esencial benigno (BEB) es una afecci?n del sistema nervioso que causa el cierre involuntario de los ojos. Con el transcurso del [place], los s?ntomas de esta afecci?n pueden volverse m?s frecuentes e intensos. Esto puede hacer que resulte dif?cil mantener los ojos abiertos y Optometrist actividades como ver televisi?n, Higher education careers adviser. Si esta afecci?n no es tratada, los ojos pueden cerrarse con fuerza durante largos per?odos de [place]. ??Cu?les son las causas? ?Se desconoce la causa exacta de esta afecci?n. Puede transmitirse en las familias a trav?s de un gen anormal. Los factores desencadenantes de los s?ntomas pueden ser los siguientes: ?El viento. ?La luz solar o luces brillantes. ?El ruido. ?Caminar al [employer]. ?Contaminaci?n del aire. ?Otros factores desencadenantes pueden incluir: ?Esfuerzo ocular por leer, ver la televisi?n, usar dispositivos electr?nicos o conducir. ?Fatiga. ?Estr?s. ??Qu? [place]? ?Es m?s probable que sufra esta afecci?n si: ?Tiene 50 a?os o m?s. ?Tiene antecedentes familiares de BEB. ?Es mujer. ?Existen problemas con la parte del cerebro que controla los movimientos. ?Tiene un trastorno Licensed conveyancer. ?Tiene antecedentes de enfermedades oculares o traumatismo en [employer]. ?Toma algunos medicamentos, como aquellos que se usan para el tratamiento de la enfermedad de Parkinson. ??Cu?les son los signos o s?ntomas? ?El primer s?ntoma de esta afecci?n es el parpadeo frecuente o contracciones oculares que no puede Chief Technology Officer. Puede ocurrir durante el d?a y Armed forces operational officer a la noche. Otros s?ntomas tempranos pueden ser los siguientes: ?Sequedad e irritaci?n ocular. ?Irritaci?n o dolor ocular a causa de luces brillantes (fotofobia). ?Puede sentir un alivio temporal de los s?ntomas al cantar, Academic librarian, Engineer, manufacturing systems o re?r. ?Los s?ntomas posteriores de esta afecci?n  incluyen los siguientes: ?Dena Billet?ar y Sales promotion account executive los ojos por m?s tiempo que lo usual. ?Espasmos musculares en la lengua y la mand?bula. ?Incapacidad para [employer] ojos abiertos durante largos per?odos de [place]. ?Con el tiempo, los s?ntomas pueden intensificarse y durar m?s tiempo. ??C?mo se diagnostica? ?Esta afecci?n se diagnostica en funci?n de sus s?ntomas, antecedentes m?dicos y de un examen f?sico. ??C?mo se trata? ?No hay cura para esta afecci?n, pero el tratamiento puede ayudar con los s?ntomas. Las opciones de tratamiento incluyen las siguientes: ?Midwife gotas oft?lmicas con efecto hidratante (l?grimas artificiales) en los ojos. Estas gotas alivian la irritaci?n y sequedad de los ojos. ?Administrarse una inyecci?n de toxina botul?nica en los m?sculos que controlan el movimiento de los p?rpados. Habitualmente hay que repetir este tratamiento cada 3 o 4 meses. ?Tomar medicamentos, como relajantes musculares y medicamentos para la ansiedad. ?Someterse a cirug?a para extirpar parte de los m?sculos de los p?rpados (miectom?a). Esto se puede realizar si las inyecciones de toxina botul?nica no son eficaces o dejan de serlo. ?Siga estas instrucciones en su casa: ?Estilo de vida ?Use gafas polarizadas que bloqueen la luz UV (ultravioleta). ?Use protecci?n en los ojos al estar al aire libre en d?as de viento. ?Duerma lo suficiente. ?Trate de Chief Technology Officer y Product/process development scientist las situaciones estresantes. ?No mire televisi?n o pase tiempo frente a la pantalla por per?odos prolongados. ?Evite cosas que pueden desencadenar la afecci?n. ?Instrucciones generales ?Inf?rmese todo lo que pueda sobre su afecci?n. ?Trabaje en estrecha colaboraci?n con el equipo de m?dicos. ?Use los medicamentos de venta libre y los recetados, incluidas las gotas oft?lmicas, solamente como se lo haya indicado el m?dico. ?Mantenga limpios los p?rpados. L?velos diariamente con agua y jab?n suave. Esto ayudar? a Mining engineer irritaci?n y las infecciones. ?No  conduzca si tiene un episodio  que afecta su visibilidad. ?Concurra a [place]. ?Comun?quese con un m?dico si: ?Los s?ntomas no se pueden Dispensing optician. ?Tiene los ojos enrojecidos, llorosos o secos. ?Tiene los ojos ca?dos. ?Se siente ansioso o deprimido. ?Solicite ayuda de inmediato si: ?No puede abrir los ojos. ?Resumen ?El blefaroespasmo esencial benigno (BEB) es una afecci?n del sistema nervioso que causa el cierre involuntario de los ojos. ?Se desconoce la causa exacta de esta afecci?n. Puede transmitirse en las familias a trav?s de un gen anormal. ?No hay cura para esta afecci?n, pero el tratamiento puede ayudar con los s?ntomas. ?Esta informaci?n no tiene Marine scientist el consejo del m?dico. Aseg?rese de hacerle al m?dico cualquier pregunta que tenga. ?Document Revised: 04/12/2021 Document Reviewed: 04/12/2021 ?Elsevier Patient Education ? [place]. ? ?

## 2022-02-21 NOTE — Progress Notes (Signed)
Acute Office Visit  Subjective:     Patient ID: Catherine Nixon, female    DOB: March 13, 2001, 21 y.o.   MRN: [id number]  Chief Complaint  Patient presents with   Fatigue    With dizziness, nausea and decreased appetite. 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  Also, she has been more fatigued than usual.  She has a history of anemia will evaluate. Will check blood work.I am so glad you call me to tell me about it she said said telephone please send the report in the next clinic report she is able to take.  Patient has No headache, No chest pain, No abdominal pain - No Nausea, No new weakness tingling or numbness, No Cough - shortness of breath.  ROS Comprehensive ROS Pertinent positive and negative noted in HPI       Objective:    BP 116/79 (BP Location: Right Arm, Patient Position: Sitting, Cuff Size: Normal)   Pulse (!) 56   Temp 98.1 F (36.7 C) (Oral)   Ht 4' 11" (1.499 m)   Wt 140 lb 6.4 oz (63.7 kg)   LMP 02/16/2022 (Approximate)   SpO2 100%   Breastfeeding Yes   BMI 28.36 kg/m  BP Readings from Last 3 Encounters:  02/21/22 116/79  12/14/21 (!) 103/56  10/23/21 112/65   Physical Exam Vitals reviewed.  Constitutional:      Appearance: Normal appearance.  HENT:     Head: Normocephalic.     Right Ear: Tympanic membrane and external ear normal.     Left Ear: Tympanic membrane and external ear normal.     Nose: Nose normal.  Eyes:     Extraocular Movements: Extraocular movements intact.  Cardiovascular:     Rate and Rhythm: Normal rate and regular rhythm.  Pulmonary:     Effort: Pulmonary effort is normal.     Breath sounds: Normal breath sounds.  Abdominal:     General: Bowel sounds are normal.     Palpations: Abdomen  is soft.  Musculoskeletal:        General: Normal range of motion.     Cervical back: Normal range of motion.  Skin:    General: Skin is warm and dry.  Neurological:     Mental Status: She is alert and oriented to person, place, and time.  Psychiatric:        Mood and Affect: Mood normal.        Behavior: Behavior normal.        Thought Content: Thought content normal.        Judgment: Judgment normal.   Assessment & Plan:  Milla was seen today for fatigue.  Diagnoses and all orders for this visit:  Anemia, unspecified type This means you have less iron in the body than expected. Increase iron-rich foods such as dark green leafy vegetables and dried fruit such as raisins and apricots.   -     CBC with Differential  Eye twitch twitch of her left eye explain usually due to muscle spasm can be caused by stress and eye strain.  Hypokalemia  The body needs potassium to control blood pressure and to keep the muscles and nervous system healthy. Here  are some healthy foods below that are high in potassium. Also you can get the white label salt of "NO SALT" salt substitute, 1/4 teaspoon of this is equivalent to 69mq potassium.  -     CMP14+EGFR  Vision changes blurriness from her right eye.  -     Ambulatory referral to OLake Hallie NP

## 2022-02-22 LAB — CMP14+EGFR
ALT: 20 IU/L (ref 0–32)
AST: 18 IU/L (ref 0–40)
Albumin/Globulin Ratio: 1.7 (ref 1.2–2.2)
Albumin: 4.4 g/dL (ref 3.9–5.0)
Alkaline Phosphatase: 138 IU/L — ABNORMAL HIGH (ref 44–121)
BUN/Creatinine Ratio: 22 (ref 9–23)
BUN: 12 mg/dL (ref 6–20)
Bilirubin Total: <0.2 mg/dL (ref 0.0–1.2)
CO2: 24 mmol/L (ref 20–29)
Calcium: 9.5 mg/dL (ref 8.7–10.2)
Chloride: 106 mmol/L (ref 96–106)
Creatinine, Ser: 0.54 mg/dL — ABNORMAL LOW (ref 0.57–1.00)
Globulin, Total: 2.6 g/dL (ref 1.5–4.5)
Glucose: 96 mg/dL (ref 70–99)
Potassium: 4.5 mmol/L (ref 3.5–5.2)
Sodium: 143 mmol/L (ref 134–144)
Total Protein: 7 g/dL (ref 6.0–8.5)
eGFR: 133 mL/min/{1.73_m2} (ref >59)

## 2022-02-22 LAB — CBC WITH DIFFERENTIAL/PLATELET
Basophils Absolute: 0 10*3/uL (ref 0.0–0.2)
Basos: 0 %
EOS (ABSOLUTE): 0.2 10*3/uL (ref 0.0–0.4)
Eos: 3 %
Hematocrit: 39.6 % (ref 34.0–46.6)
Hemoglobin: 12.6 g/dL (ref 11.1–15.9)
Immature Grans (Abs): 0 10*3/uL (ref 0.0–0.1)
Immature Granulocytes: 0 %
Lymphocytes Absolute: 1.9 10*3/uL (ref 0.7–3.1)
Lymphs: 28 %
MCH: 26.4 pg — ABNORMAL LOW (ref 26.6–33.0)
MCHC: 31.8 g/dL (ref 31.5–35.7)
MCV: 83 fL (ref 79–97)
Monocytes Absolute: 0.3 10*3/uL (ref 0.1–0.9)
Monocytes: 5 %
Neutrophils Absolute: 4.4 10*3/uL (ref 1.4–7.0)
Neutrophils: 64 %
Platelets: 321 10*3/uL (ref 150–450)
RBC: 4.77 x10E6/uL (ref 3.77–5.28)
RDW: 13.2 % (ref 11.7–15.4)
WBC: 6.8 10*3/uL (ref 3.4–10.8)

## 2022-03-08 ENCOUNTER — Emergency Department
Admission: EM | Admit: 2022-03-08 | Discharge: 2022-03-09 | Disposition: A | Discharge status: Home / Self Care | Payer: Medicaid Other | Attending: Emergency Medicine | Admitting: Emergency Medicine

## 2022-03-08 ENCOUNTER — Emergency Department: Payer: Medicaid Other

## 2022-03-08 ENCOUNTER — Encounter: Payer: Self-pay | Admitting: Emergency Medicine

## 2022-03-08 DIAGNOSIS — S3991XA Unspecified injury of abdomen, initial encounter: Secondary | ICD-10-CM | POA: Diagnosis not present

## 2022-03-08 DIAGNOSIS — M25522 Pain in left elbow: Secondary | ICD-10-CM | POA: Diagnosis not present

## 2022-03-08 DIAGNOSIS — Z041 Encounter for examination and observation following transport accident: Secondary | ICD-10-CM | POA: Diagnosis not present

## 2022-03-08 DIAGNOSIS — M7918 Myalgia, other site: Secondary | ICD-10-CM

## 2022-03-08 DIAGNOSIS — S299XXA Unspecified injury of thorax, initial encounter: Secondary | ICD-10-CM | POA: Diagnosis not present

## 2022-03-08 DIAGNOSIS — S7002XA Contusion of left hip, initial encounter: Secondary | ICD-10-CM | POA: Insufficient documentation

## 2022-03-08 DIAGNOSIS — S50812A Abrasion of left forearm, initial encounter: Secondary | ICD-10-CM | POA: Insufficient documentation

## 2022-03-08 DIAGNOSIS — M25552 Pain in left hip: Secondary | ICD-10-CM | POA: Diagnosis not present

## 2022-03-08 DIAGNOSIS — M25532 Pain in left wrist: Secondary | ICD-10-CM | POA: Diagnosis not present

## 2022-03-08 DIAGNOSIS — S199XXA Unspecified injury of neck, initial encounter: Secondary | ICD-10-CM | POA: Diagnosis not present

## 2022-03-08 DIAGNOSIS — Y92410 Unspecified street and highway as the place of occurrence of the external cause: Secondary | ICD-10-CM | POA: Diagnosis not present

## 2022-03-08 DIAGNOSIS — S0990XA Unspecified injury of head, initial encounter: Secondary | ICD-10-CM | POA: Insufficient documentation

## 2022-03-08 DIAGNOSIS — M79603 Pain in arm, unspecified: Secondary | ICD-10-CM | POA: Diagnosis not present

## 2022-03-08 DIAGNOSIS — R4020 Unspecified coma: Secondary | ICD-10-CM | POA: Diagnosis not present

## 2022-03-08 DIAGNOSIS — S79912A Unspecified injury of left hip, initial encounter: Secondary | ICD-10-CM | POA: Diagnosis present

## 2022-03-08 LAB — CBC WITH DIFFERENTIAL/PLATELET
Abs Immature Granulocytes: 0.04 10*3/uL (ref 0.00–0.07)
Basophils Absolute: 0 10*3/uL (ref 0.0–0.1)
Basophils Relative: 0 %
Eosinophils Absolute: 0.3 10*3/uL (ref 0.0–0.5)
Eosinophils Relative: 2 %
HCT: 38 % (ref 36.0–46.0)
Hemoglobin: 12.2 g/dL (ref 12.0–15.0)
Immature Granulocytes: 0 %
Lymphocytes Relative: 30 %
Lymphs Abs: 3.2 10*3/uL (ref 0.7–4.0)
MCH: 26.2 pg (ref 26.0–34.0)
MCHC: 32.1 g/dL (ref 30.0–36.0)
MCV: 81.5 fL (ref 80.0–100.0)
Monocytes Absolute: 0.5 10*3/uL (ref 0.1–1.0)
Monocytes Relative: 5 %
Neutro Abs: 6.6 10*3/uL (ref 1.7–7.7)
Neutrophils Relative %: 63 %
Platelets: 296 10*3/uL (ref 150–400)
RBC: 4.66 MIL/uL (ref 3.87–5.11)
RDW: 13.1 % (ref 11.5–15.5)
WBC: 10.6 10*3/uL — ABNORMAL HIGH (ref 4.0–10.5)
nRBC: 0 % (ref 0.0–0.2)

## 2022-03-08 LAB — BASIC METABOLIC PANEL
Anion gap: 7 (ref 5–15)
BUN: 18 mg/dL (ref 6–20)
CO2: 19 mmol/L — ABNORMAL LOW (ref 22–32)
Calcium: 9.1 mg/dL (ref 8.9–10.3)
Chloride: 112 mmol/L — ABNORMAL HIGH (ref 98–111)
Creatinine, Ser: 0.56 mg/dL (ref 0.44–1.00)
GFR, Estimated: >60 mL/min (ref >60)
Glucose, Bld: 111 mg/dL — ABNORMAL HIGH (ref 70–99)
Potassium: 3.3 mmol/L — ABNORMAL LOW (ref 3.5–5.1)
Sodium: 138 mmol/L (ref 135–145)

## 2022-03-08 LAB — SAMPLE TO BLOOD BANK

## 2022-03-08 MED ORDER — OXYCODONE-ACETAMINOPHEN 5-325 MG PO TABS
1.0000 | ORAL_TABLET | Freq: Once | ORAL | Status: AC
  Administered 2022-03-09: 1 via ORAL
  Filled 2022-03-08: qty 1

## 2022-03-08 MED ORDER — FENTANYL CITRATE PF 50 MCG/ML IJ SOSY: 50.0000 ug | PREFILLED_SYRINGE | Freq: Once | INTRAMUSCULAR | Status: DC

## 2022-03-08 MED ORDER — IOHEXOL 300 MG/ML  SOLN
100.0000 mL | Freq: Once | INTRAMUSCULAR | Status: AC | PRN
  Administered 2022-03-08: 100 mL via INTRAVENOUS
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
## 2022-03-08 NOTE — ED Notes (Signed)
Notified provider of pt's pain; awaiting orders.

## 2022-03-08 NOTE — Discharge Instructions (Addendum)
Please follow up with your ob/gyn to have a pelvic ultrasound performed to evaluate your IUD.

## 2022-03-10 ENCOUNTER — Encounter (INDEPENDENT_AMBULATORY_CARE_PROVIDER_SITE_OTHER): Payer: Self-pay | Admitting: Primary Care

## 2022-03-11 ENCOUNTER — Encounter: Payer: Self-pay | Admitting: Obstetrics & Gynecology

## 2022-03-12 ENCOUNTER — Telehealth: Payer: Self-pay

## 2022-03-12 NOTE — Telephone Encounter (Signed)
Transition Care Management Unsuccessful Follow-up Telephone Call  Date of discharge and from where:  03/09/2022 from [facility]  Attempts:  1st Attempt  Reason for unsuccessful TCM follow-up call:  Left voice message   Need an interpretor

## 2022-03-13 NOTE — Telephone Encounter (Signed)
Transition Care Management Unsuccessful Follow-up Telephone Call  Date of discharge and from where:  03/09/2022 from [facility]  Attempts:  2nd Attempt  Reason for unsuccessful TCM follow-up call:  Left voice message

## 2022-03-14 NOTE — Telephone Encounter (Signed)
Transition Care Management Unsuccessful Follow-up Telephone Call  Date of discharge and from where:  03/09/2022 from [facility]  Attempts:  3rd Attempt  Reason for unsuccessful TCM follow-up call:  Unable to reach patient

## 2022-03-15 ENCOUNTER — Other Ambulatory Visit: Payer: Self-pay | Admitting: Obstetrics & Gynecology

## 2022-03-15 ENCOUNTER — Encounter (INDEPENDENT_AMBULATORY_CARE_PROVIDER_SITE_OTHER): Payer: Self-pay | Admitting: Primary Care

## 2022-03-15 DIAGNOSIS — Z30431 Encounter for routine checking of intrauterine contraceptive device: Secondary | ICD-10-CM

## 2022-03-15 DIAGNOSIS — N939 Abnormal uterine and vaginal bleeding, unspecified: Secondary | ICD-10-CM

## 2022-03-15 NOTE — Progress Notes (Signed)
Order for pelvic US 

## 2022-03-19 ENCOUNTER — Ambulatory Visit: Payer: Medicaid Other | Admitting: Advanced Practice Midwife

## 2022-03-23 ENCOUNTER — Ambulatory Visit (HOSPITAL_COMMUNITY)
Admission: RE | Admit: 2022-03-23 | Discharge: 2022-03-23 | Disposition: A | Discharge status: Home / Self Care | Payer: Medicaid Other | Source: Ambulatory Visit | Attending: Obstetrics & Gynecology | Admitting: Obstetrics & Gynecology

## 2022-03-23 DIAGNOSIS — Z30431 Encounter for routine checking of intrauterine contraceptive device: Secondary | ICD-10-CM | POA: Insufficient documentation

## 2022-03-23 DIAGNOSIS — N939 Abnormal uterine and vaginal bleeding, unspecified: Secondary | ICD-10-CM | POA: Diagnosis not present

## 2022-04-06 ENCOUNTER — Ambulatory Visit: Payer: Medicaid Other | Admitting: Obstetrics & Gynecology

## 2022-04-06 ENCOUNTER — Encounter: Payer: Self-pay | Admitting: Obstetrics & Gynecology

## 2022-04-06 VITALS — BP 90/60 | HR 63 | Wt 140.0 lb

## 2022-04-06 DIAGNOSIS — Z30432 Encounter for removal of intrauterine contraceptive device: Secondary | ICD-10-CM

## 2022-04-06 DIAGNOSIS — Z3202 Encounter for pregnancy test, result negative: Secondary | ICD-10-CM

## 2022-04-06 DIAGNOSIS — Z30430 Encounter for insertion of intrauterine contraceptive device: Secondary | ICD-10-CM | POA: Diagnosis not present

## 2022-04-06 LAB — POCT URINE PREGNANCY: Preg Test, Ur: NEGATIVE

## 2022-04-06 MED ORDER — PARAGARD INTRAUTERINE COPPER IU IUD
1.0000 | INTRAUTERINE_SYSTEM | Freq: Once | INTRAUTERINE | Status: AC
  Administered 2022-04-06: 1 via INTRAUTERINE
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
## 2022-04-17 ENCOUNTER — Ambulatory Visit: Payer: Medicaid Other | Admitting: Women's Health

## 2022-04-24 ENCOUNTER — Encounter (INDEPENDENT_AMBULATORY_CARE_PROVIDER_SITE_OTHER): Payer: Self-pay | Admitting: Primary Care

## 2022-05-17 ENCOUNTER — Ambulatory Visit: Payer: Medicaid Other | Admitting: Adult Health

## 2022-05-23 ENCOUNTER — Encounter: Payer: Self-pay | Admitting: Adult Health

## 2022-05-23 ENCOUNTER — Ambulatory Visit (INDEPENDENT_AMBULATORY_CARE_PROVIDER_SITE_OTHER): Payer: Medicaid Other | Admitting: Adult Health

## 2022-05-23 VITALS — BP 97/61 | HR 74 | Ht <= 58 in | Wt 142.0 lb

## 2022-05-23 DIAGNOSIS — Z975 Presence of (intrauterine) contraceptive device: Secondary | ICD-10-CM | POA: Insufficient documentation

## 2022-05-23 DIAGNOSIS — Z30431 Encounter for routine checking of intrauterine contraceptive device: Secondary | ICD-10-CM | POA: Diagnosis not present

## 2022-05-23 NOTE — Progress Notes (Signed)
  Subjective:     Patient ID: Catherine Nixon, female   DOB: 02-02-2001, 21 y.o.   MRN: [id number]  HPI Catherine Nixon is a 21 year old Hispanic female, single, G2P2 in for IUD check, she had paragard removed and replaced 04/06/22.  Interpreter is with her.  Lab Results  Component Value Date   DIAGPAP  03/28/2020    - Negative for intraepithelial lesion or malignancy (NILM)   PCP is Westley Hummer NP  Review of Systems Can feel strings No complaints  Reviewed past medical,surgical, social and family history. Reviewed medications and allergies.     Objective:   Physical Exam BP 97/61 (BP Location: Left Arm, Patient Position: Sitting, Cuff Size: Normal)   Pulse 74   Ht 4\' 10"  (1.473 m)   Wt 142 lb (64.4 kg)   LMP 05/10/2022   Breastfeeding No   BMI 29.68 kg/m     Skin warm and dry.Pelvic: external genitalia is normal in appearance no lesions, vagina: pink and moist,urethra has no lesions or masses noted, cervix is bulbous, +IUD strings at os, uterus: normal size, shape and contour, non tender, no masses felt, adnexa: no masses or tenderness noted. Bladder is non tender and no masses felt. Fall risk is low  Upstream - 05/23/22 1542       Pregnancy Intention Screening   Does the patient want to become pregnant in the next year? No    Does the patient's partner want to become pregnant in the next year? No    Would the patient like to discuss contraceptive options today? No      Contraception Wrap Up   Current Method IUD or IUS    End Method IUD or IUS            Examination chaperoned by 05/25/22 LPN  Assessment:     1. IUD check up +strings at os  2. IUD (intrauterine device) in place Paragard placed 04/06/22    Plan:     Return in about 10 months for pap and physical

## 2022-05-24 ENCOUNTER — Ambulatory Visit (INDEPENDENT_AMBULATORY_CARE_PROVIDER_SITE_OTHER): Payer: Medicaid Other | Admitting: Primary Care

## 2022-07-07 IMAGING — US US ABDOMEN LIMITED
1 series · 14 of 25 positions shown · non-contrast
Comparison: 08/26/2020

CLINICAL DATA: Right upper quadrant pain.

EXAM:
ULTRASOUND ABDOMEN LIMITED RIGHT UPPER QUADRANT

[Series 1: us abdomen limited ruq (liver/gb) · 65 acquisitions, 14 frames shown]
[im 1/65]
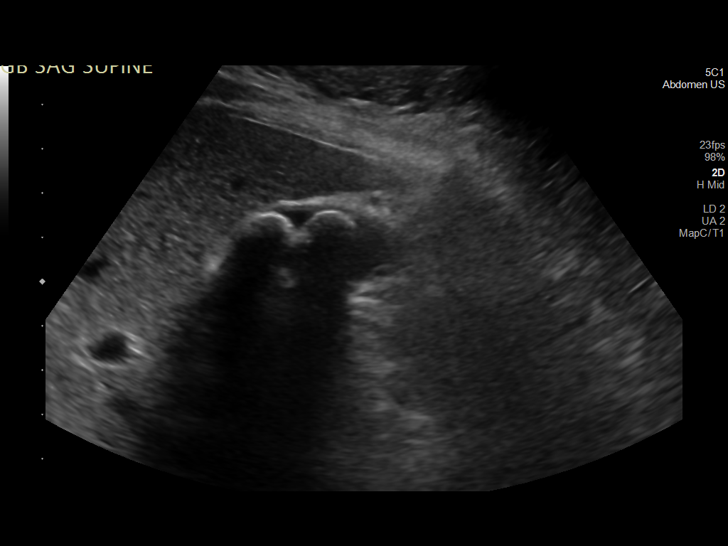
[im 6/65]
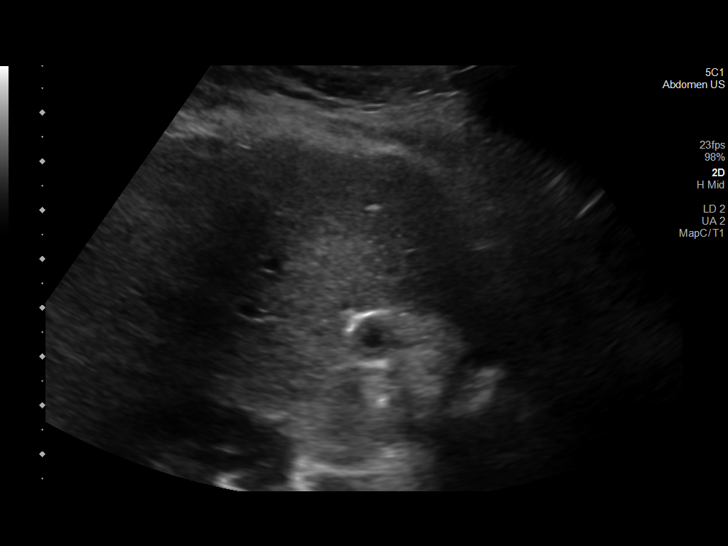
[im 11/65]
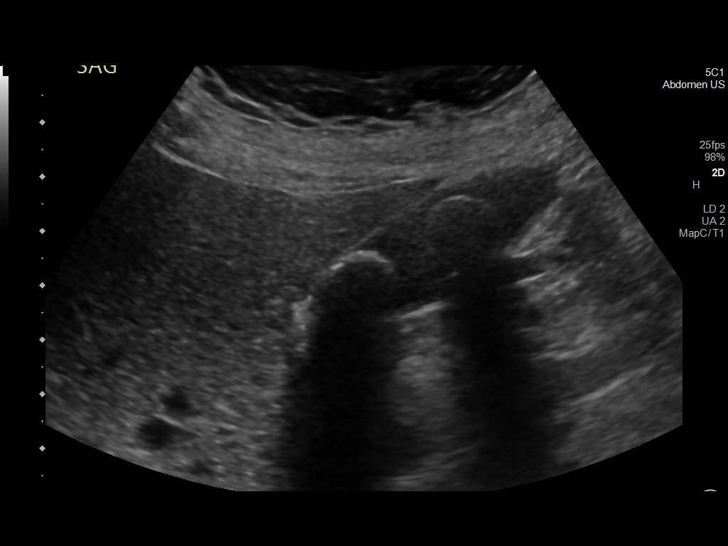
[im 17/65]
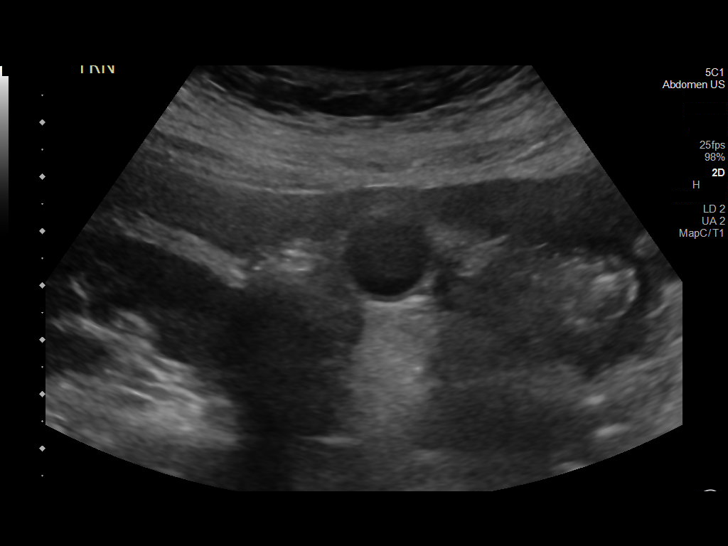
[im 22/65]
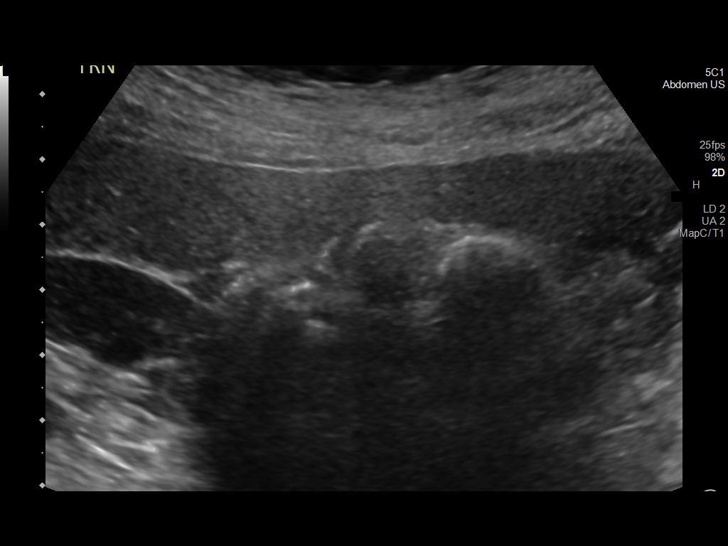
[im 25/65]
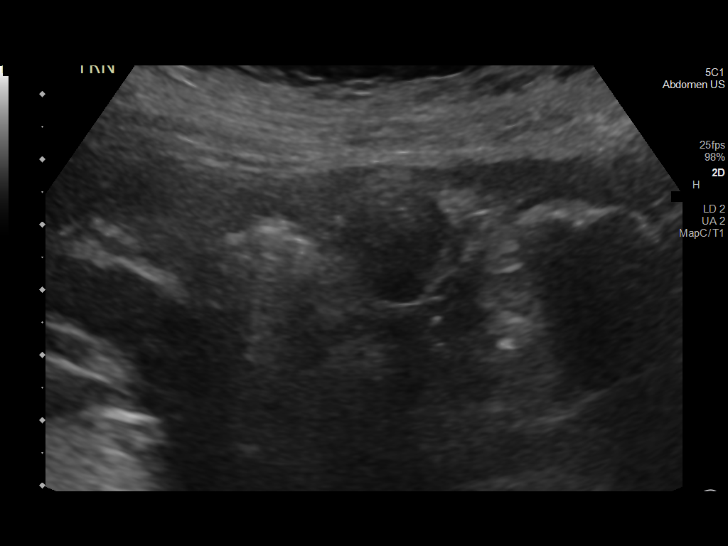
[im 30/65]
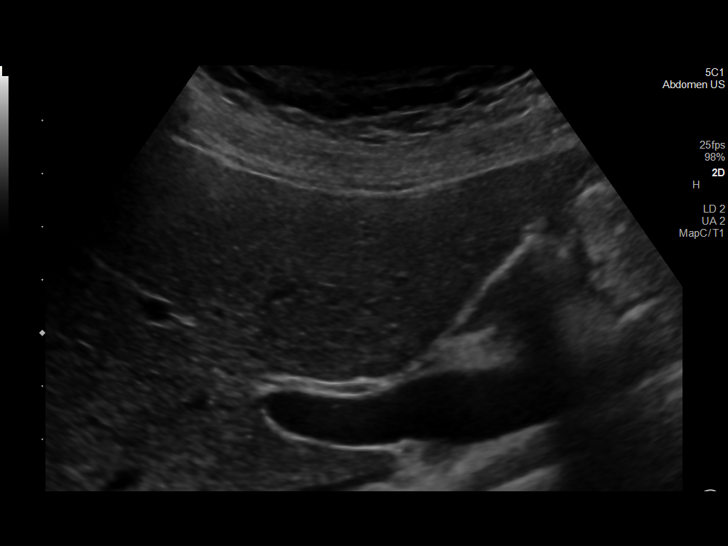
[im 35/65]
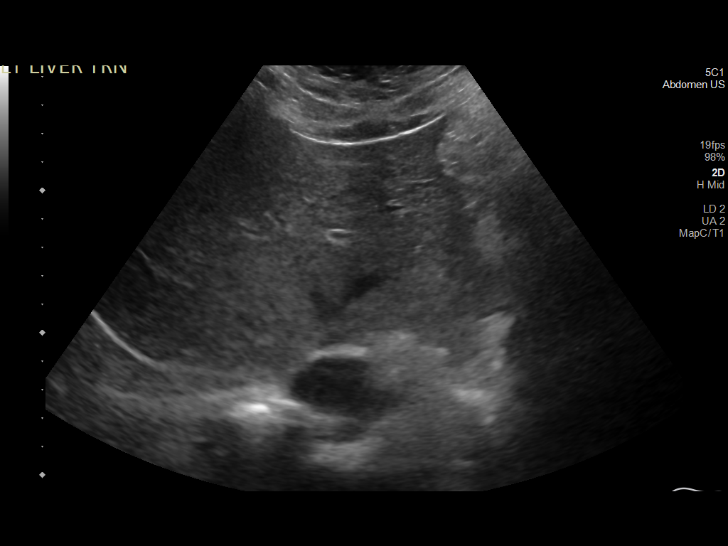
[im 41/65]
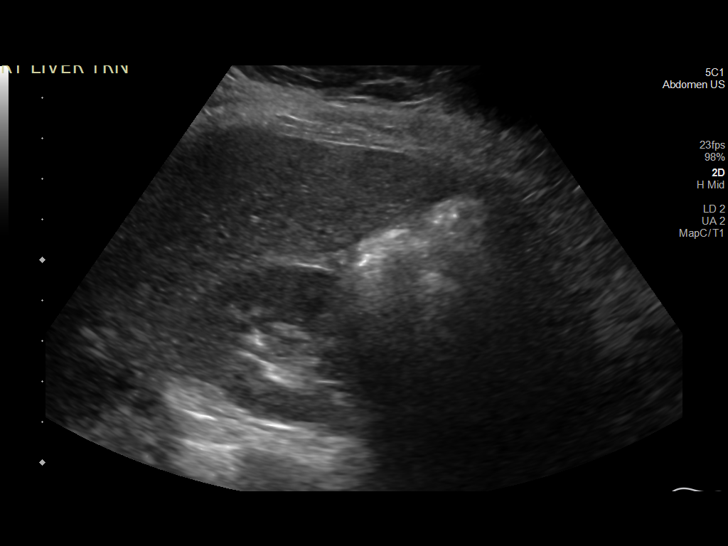
[im 43/65]
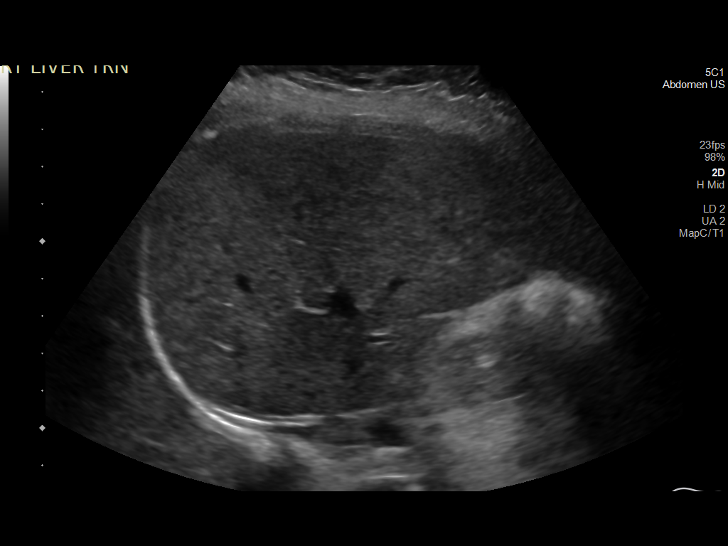
[im 49/65]
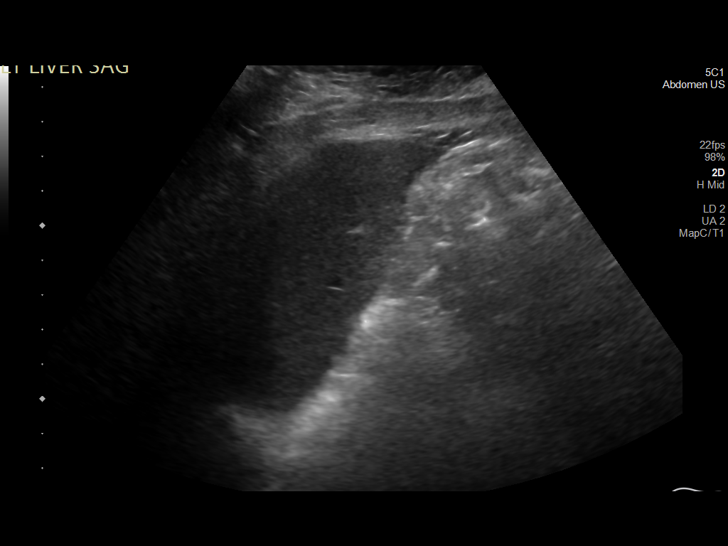
[im 54/65]
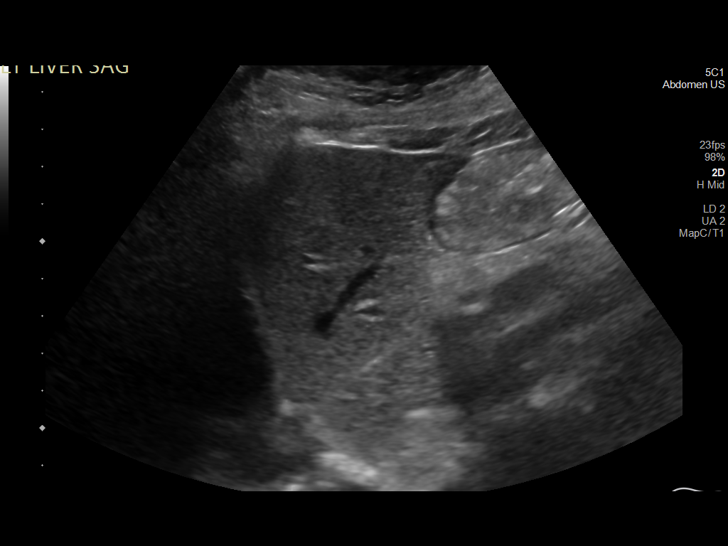
[im 59/65]
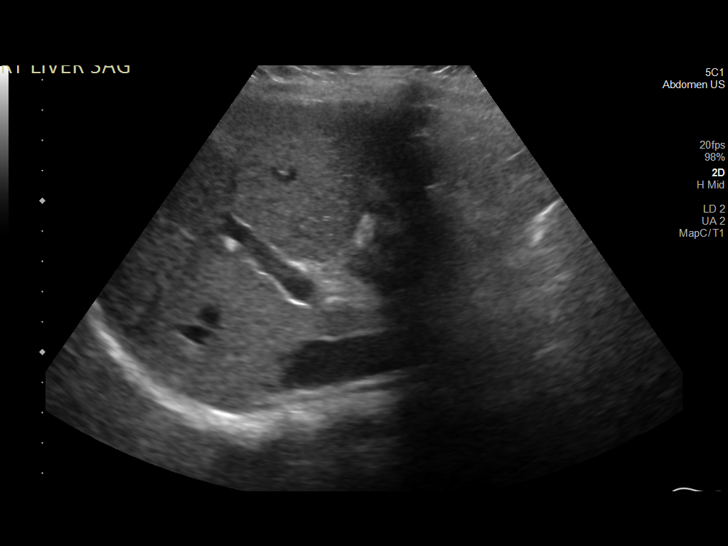
[im 65/65]
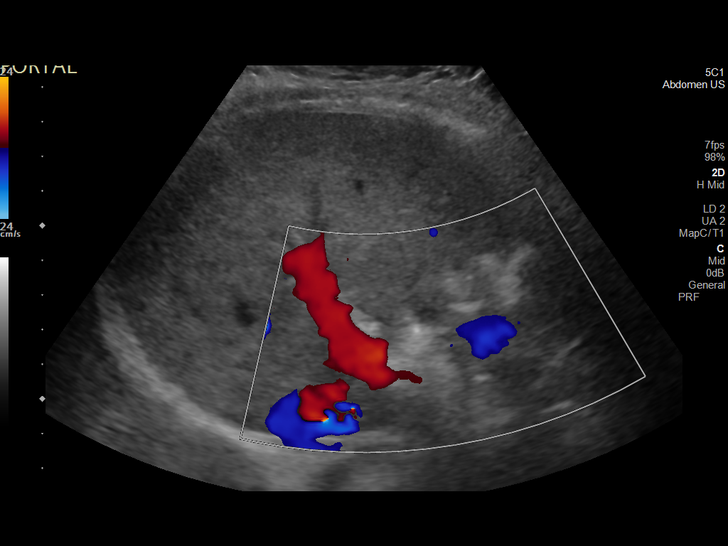

[14 of 25 positions shown; findings below may reference images not displayed]

FINDINGS: Gallbladder:

Multiple stones in the gallbladder. Largest measures 1.8 cm
diameter. Gallbladder is contracted. Patient indicates NPO for 6
hours. Murphy's sign is negative but is unreliable as the patient
has had pain medication. No wall thickening or edema.

Common bile duct:

Diameter: 2 mm, normal

Liver:

A 5 mm shadowing echogenic focus is demonstrated in the left liver,
likely representing a granuloma. Otherwise, no focal liver lesions
are identified. Portal vein is patent on color Doppler imaging with
normal direction of blood flow towards the liver.

Other: None.
IMPRESSION: Cholelithiasis. No additional changes to suggest acute
cholecystitis. Murphy's sign is negative but unreliable due to pain
medication.

## 2022-09-25 ENCOUNTER — Encounter (INDEPENDENT_AMBULATORY_CARE_PROVIDER_SITE_OTHER): Payer: Self-pay | Admitting: Primary Care

## 2022-09-26 ENCOUNTER — Other Ambulatory Visit (INDEPENDENT_AMBULATORY_CARE_PROVIDER_SITE_OTHER): Payer: Self-pay | Admitting: Primary Care

## 2022-09-26 MED ORDER — AMOXICILLIN-POT CLAVULANATE 875-125 MG PO TABS: 1.0000 | ORAL_TABLET | Freq: Two times a day (BID) | ORAL | 0 refills | Status: DC

## 2022-09-26 MED ORDER — FLUCONAZOLE 150 MG PO TABS: 150.0000 mg | ORAL_TABLET | Freq: Every day | ORAL | 1 refills | Status: DC

## 2022-10-14 ENCOUNTER — Encounter (INDEPENDENT_AMBULATORY_CARE_PROVIDER_SITE_OTHER): Payer: Self-pay | Admitting: Primary Care

## 2023-01-18 ENCOUNTER — Emergency Department (HOSPITAL_COMMUNITY)
Admission: EM | Admit: 2023-01-18 | Discharge: 2023-01-18 | Discharge status: Against Medical Advice | Payer: Medicaid Other | Source: Home / Self Care

## 2023-01-18 ENCOUNTER — Other Ambulatory Visit: Payer: Self-pay

## 2023-01-18 ENCOUNTER — Emergency Department: Payer: Self-pay

## 2023-01-18 ENCOUNTER — Emergency Department
Admission: EM | Admit: 2023-01-18 | Discharge: 2023-01-18 | Disposition: A | Discharge status: Home / Self Care | Payer: Self-pay | Attending: Emergency Medicine | Admitting: Emergency Medicine

## 2023-01-18 ENCOUNTER — Encounter: Payer: Self-pay | Admitting: Intensive Care

## 2023-01-18 DIAGNOSIS — J020 Streptococcal pharyngitis: Secondary | ICD-10-CM | POA: Insufficient documentation

## 2023-01-18 DIAGNOSIS — Z1152 Encounter for screening for COVID-19: Secondary | ICD-10-CM | POA: Insufficient documentation

## 2023-01-18 LAB — URINALYSIS, ROUTINE W REFLEX MICROSCOPIC
Bacteria, UA: NONE SEEN
Bilirubin Urine: NEGATIVE
Glucose, UA: NEGATIVE mg/dL
Hgb urine dipstick: NEGATIVE
Ketones, ur: 5 mg/dL — AB
Leukocytes,Ua: NEGATIVE
Nitrite: NEGATIVE
Protein, ur: NEGATIVE mg/dL
Specific Gravity, Urine: 1.018 (ref 1.005–1.030)
pH: 8 (ref 5.0–8.0)

## 2023-01-18 LAB — COMPREHENSIVE METABOLIC PANEL
ALT: 16 U/L (ref 0–44)
AST: 21 U/L (ref 15–41)
Albumin: 4.2 g/dL (ref 3.5–5.0)
Alkaline Phosphatase: 99 U/L (ref 38–126)
Anion gap: 13 (ref 5–15)
BUN: 12 mg/dL (ref 6–20)
CO2: 20 mmol/L — ABNORMAL LOW (ref 22–32)
Calcium: 8.9 mg/dL (ref 8.9–10.3)
Chloride: 101 mmol/L (ref 98–111)
Creatinine, Ser: 0.53 mg/dL (ref 0.44–1.00)
GFR, Estimated: >60 mL/min (ref >60)
Glucose, Bld: 109 mg/dL — ABNORMAL HIGH (ref 70–99)
Potassium: 3.5 mmol/L (ref 3.5–5.1)
Sodium: 134 mmol/L — ABNORMAL LOW (ref 135–145)
Total Bilirubin: 0.8 mg/dL (ref 0.3–1.2)
Total Protein: 7.4 g/dL (ref 6.5–8.1)

## 2023-01-18 LAB — RESP PANEL BY RT-PCR (RSV, FLU A&B, COVID)  RVPGX2
Influenza A by PCR: NEGATIVE
Influenza B by PCR: NEGATIVE
Resp Syncytial Virus by PCR: NEGATIVE
SARS Coronavirus 2 by RT PCR: NEGATIVE

## 2023-01-18 LAB — CBC WITH DIFFERENTIAL/PLATELET
Abs Immature Granulocytes: 0.04 10*3/uL (ref 0.00–0.07)
Basophils Absolute: 0 10*3/uL (ref 0.0–0.1)
Basophils Relative: 0 %
Eosinophils Absolute: 0 10*3/uL (ref 0.0–0.5)
Eosinophils Relative: 0 %
HCT: 42.5 % (ref 36.0–46.0)
Hemoglobin: 14 g/dL (ref 12.0–15.0)
Immature Granulocytes: 0 %
Lymphocytes Relative: 7 %
Lymphs Abs: 0.9 10*3/uL (ref 0.7–4.0)
MCH: 27.6 pg (ref 26.0–34.0)
MCHC: 32.9 g/dL (ref 30.0–36.0)
MCV: 83.7 fL (ref 80.0–100.0)
Monocytes Absolute: 0.4 10*3/uL (ref 0.1–1.0)
Monocytes Relative: 3 %
Neutro Abs: 11.4 10*3/uL — ABNORMAL HIGH (ref 1.7–7.7)
Neutrophils Relative %: 90 %
Platelets: 222 10*3/uL (ref 150–400)
RBC: 5.08 MIL/uL (ref 3.87–5.11)
RDW: 13.1 % (ref 11.5–15.5)
WBC: 12.7 10*3/uL — ABNORMAL HIGH (ref 4.0–10.5)
nRBC: 0 % (ref 0.0–0.2)

## 2023-01-18 LAB — GROUP A STREP BY PCR: Group A Strep by PCR: DETECTED — AB

## 2023-01-18 LAB — MONONUCLEOSIS SCREEN: Mono Screen: NEGATIVE

## 2023-01-18 LAB — LACTIC ACID, PLASMA: Lactic Acid, Venous: 0.7 mmol/L (ref 0.5–1.9)

## 2023-01-18 LAB — POC URINE PREG, ED: Preg Test, Ur: NEGATIVE

## 2023-01-18 MED ORDER — AMOXICILLIN 500 MG PO CAPS
1000.0000 mg | ORAL_CAPSULE | Freq: Once | ORAL | Status: AC
  Administered 2023-01-18: 1000 mg via ORAL
  Filled 2023-01-18: qty 2

## 2023-01-18 MED ORDER — ONDANSETRON 4 MG PO TBDP: 4.0000 mg | ORAL_TABLET | Freq: Three times a day (TID) | ORAL | 0 refills | Status: DC | PRN

## 2023-01-18 MED ORDER — LIDOCAINE VISCOUS HCL 2 % MT SOLN: 10.0000 mL | OROMUCOSAL | 0 refills | Status: DC | PRN

## 2023-01-18 MED ORDER — SODIUM CHLORIDE 0.9 % IV SOLN: Freq: Once | INTRAVENOUS | Status: AC

## 2023-01-18 MED ORDER — AMOXICILLIN 875 MG PO TABS: 875.0000 mg | ORAL_TABLET | Freq: Two times a day (BID) | ORAL | 0 refills | Status: DC

## 2023-01-18 MED ORDER — LOPERAMIDE HCL 2 MG PO TABS: 2.0000 mg | ORAL_TABLET | Freq: Four times a day (QID) | ORAL | 0 refills | Status: DC | PRN

## 2023-01-18 NOTE — ED Triage Notes (Addendum)
Patient presents with fever and headache. Diarrhea a few days ago but has subsided. Patient came from [facility] who gave her Tylenol

## 2023-01-18 NOTE — ED Provider Notes (Signed)
[facility] Provider Note  Patient Contact: 3:26 PM (approximate)   History   Fever and Headache   HPI  Catherine Nixon is a 22 y.o. female who presents emergency department complaining headache, congestion, sore throat, cough, nausea, diarrhea and headache.  Patient also endorses body aches, fevers and chills.  Symptoms have been ongoing for the last 3 to 4 days.  Patient has not been taking medications at home.  She went to [place] [facility], was noted to have a fever and was tachycardic and was sent to the emergency department.  Patient denies any kind of confusion, visual changes, neck pain or stiffness, shortness of breath, abdominal pain.  No dysuria, polyuria, hematuria.     Physical Exam   Triage Vital Signs: ED Triage Vitals  Enc Vitals Group     BP 01/18/23 1447 130/80     Pulse Rate 01/18/23 1447 (!) 135     Resp 01/18/23 1447 20     Temp 01/18/23 1446 (!) 103.1 F (39.5 C)     Temp Source 01/18/23 1446 Oral     SpO2 01/18/23 1447 99 %     Weight 01/18/23 1447 145 lb (65.8 kg)     Height 01/18/23 1447  (1.473 m)     Head Circumference --      Peak Flow --      Pain Score 01/18/23 1447 10     Pain Loc --      Pain Edu? --      Excl. in GC? --     Most recent vital signs: Vitals:   01/18/23 1446 01/18/23 1447  BP:  130/80  Pulse:  (!) 135  Resp:  20  Temp: (!) 103.1 F (39.5 C)   SpO2:  99%     General: Alert and in no acute distress. Head: No acute traumatic findings ENT:      Ears: TMs are bulging bilaterally.      Nose: Moderate congestion/rhinnorhea.  Tenderness with percussion over the frontal and maxillary sinuses.      Mouth/Throat: Mucous membranes are moist.  Tonsils are erythematous, edematous bilaterally.  Uvula is midline. Neck: No stridor. No cervical spine tenderness to palpation. Hematological/Lymphatic/Immunilogical: No cervical lymphadenopathy.* Cardiovascular:  Good peripheral perfusion Respiratory:  Normal respiratory effort without tachypnea or retractions. Lungs CTAB. Good air entry to the bases with no decreased or absent breath sounds Gastrointestinal: Bowel sounds 4 quadrants. Soft and nontender to palpation. No guarding or rigidity. No palpable masses. No distention. No CVA tenderness.   Musculoskeletal: Full range of motion to all extremities.  Neurologic:  No gross focal neurologic deficits are appreciated.  Skin:   No rash noted Other:   ED Results / Procedures / Treatments   Labs (all labs ordered are listed, but only abnormal results are displayed) Labs Reviewed  GROUP A STREP BY PCR - Abnormal; Notable for the following components:      Result Value   Group A Strep by PCR DETECTED (*)    All other components within normal limits  CBC WITH DIFFERENTIAL/PLATELET - Abnormal; Notable for the following components:   WBC 12.7 (*)    Neutro Abs 11.4 (*)    All other components within normal limits  COMPREHENSIVE METABOLIC PANEL - Abnormal; Notable for the following components:   Sodium 134 (*)    CO2 20 (*)    Glucose, Bld 109 (*)    All other components within normal limits  URINALYSIS, ROUTINE W REFLEX  MICROSCOPIC - Abnormal; Notable for the following components:   Color, Urine YELLOW (*)    APPearance HAZY (*)    Ketones, ur 5 (*)    All other components within normal limits  RESP PANEL BY RT-PCR (RSV, FLU A&B, COVID)  [id number]  MONONUCLEOSIS SCREEN  LACTIC ACID, PLASMA  POC URINE PREG, ED     EKG     RADIOLOGY  I personally viewed, evaluated, and interpreted these images as part of my medical decision making, as well as reviewing the written report by the radiologist.  ED Provider Interpretation: No acute cardiopulmonary findings on chest x-ray.  DG Chest 2 View  Result Date: 01/18/2023 CLINICAL DATA:  Fever.  Headache. EXAM: CHEST - 2 VIEW COMPARISON:  CT, 03/08/2022. FINDINGS: Normal heart, mediastinum and hila. Minor linear opacity in the left upper  lobe lingula consistent with atelectasis. Lungs otherwise clear. No pleural effusion or pneumothorax. Skeletal structures are within normal limits. IMPRESSION: No active cardiopulmonary disease. Electronically Signed   By: Amie Portland M.D.   On: 01/18/2023 16:08    PROCEDURES:  Critical Care performed: No  Procedures   MEDICATIONS ORDERED IN ED: Medications  amoxicillin (AMOXIL) capsule 1,000 mg (has no administration in time range)  0.9 %  sodium chloride infusion (0 mLs Intravenous Stopped 01/18/23 1545)     IMPRESSION / MDM / ASSESSMENT AND PLAN / ED COURSE  I reviewed the triage vital signs and the nursing notes.                                 Differential diagnosis includes, but is not limited to, viral illness, strep, mono, pneumonia, viral gastroenteritis, meningitis    Patient's presentation is most consistent with acute presentation with potential threat to life or bodily function.   Patient's diagnosis is consistent with strep pharyngitis.  Patient presents emergency department with headache, congestion, sore throat, cough, nausea, diarrhea.  Symptoms have been going for the last 3 to 4 days.  She did arrive febrile and tachycardic but this improved with Tylenol and fluids.  Physical exam I was concerned for strep given tonsillar findings.  She complained of headache but had no neck stiffness or other meningeal signs.  Labs reveal slightly elevated white blood cell count, negative viral panel, negative chest x-ray, negative urinalysis.  Patient did have findings on swab consistent with strep.  Patient would be treated with antibiotic, symptom control medications.  Plenty fluids, rest, antipyretics at home.  Follow-up with primary care as needed..  Patient is given ED precautions to return to the ED for any worsening or new symptoms.     FINAL CLINICAL IMPRESSION(S) / ED DIAGNOSES   Final diagnoses:  Strep pharyngitis     Rx / DC Orders   ED Discharge Orders           Ordered    amoxicillin (AMOXIL) 875 MG tablet  2 times daily        01/18/23 1752    ondansetron (ZOFRAN-ODT) 4 MG disintegrating tablet  Every 8 hours PRN        01/18/23 1752    loperamide (IMODIUM A-D) 2 MG tablet  4 times daily PRN        01/18/23 1752             Note:  This document was prepared using Dragon voice recognition software and may include unintentional dictation errors.   Liberato Stansbery, Delorise Royals,  PA-C 01/18/23 1755    Pilar Jarvis, MD 01/18/23 [phone]

## 2023-01-18 NOTE — ED Notes (Signed)
Pt ambulated to BR, no difficulty noted or expressed

## 2023-05-09 ENCOUNTER — Ambulatory Visit: Payer: Self-pay | Admitting: Obstetrics and Gynecology

## 2023-05-27 ENCOUNTER — Ambulatory Visit: Payer: Self-pay | Admitting: Women's Health

## 2023-06-03 ENCOUNTER — Encounter (INDEPENDENT_AMBULATORY_CARE_PROVIDER_SITE_OTHER): Payer: Self-pay | Admitting: Primary Care

## 2023-06-03 ENCOUNTER — Ambulatory Visit (HOSPITAL_COMMUNITY)
Admission: EM | Admit: 2023-06-03 | Discharge: 2023-06-03 | Disposition: A | Discharge status: Home / Self Care | Payer: Self-pay | Attending: Emergency Medicine | Admitting: Emergency Medicine

## 2023-06-03 ENCOUNTER — Encounter (HOSPITAL_COMMUNITY): Payer: Self-pay | Admitting: *Deleted

## 2023-06-03 DIAGNOSIS — R112 Nausea with vomiting, unspecified: Secondary | ICD-10-CM

## 2023-06-03 DIAGNOSIS — K529 Noninfective gastroenteritis and colitis, unspecified: Secondary | ICD-10-CM

## 2023-06-03 DIAGNOSIS — R197 Diarrhea, unspecified: Secondary | ICD-10-CM

## 2023-06-03 MED ORDER — ONDANSETRON HCL 4 MG PO TABS: 4.0000 mg | ORAL_TABLET | Freq: Four times a day (QID) | ORAL | 0 refills | Status: AC | PRN

## 2023-06-03 MED ORDER — LOPERAMIDE HCL 2 MG PO TABS: 2.0000 mg | ORAL_TABLET | Freq: Four times a day (QID) | ORAL | 0 refills | Status: AC | PRN
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
## 2023-06-03 NOTE — Discharge Instructions (Addendum)
Continue hydration. Take Medications as ordered. Loperamide may be take if more than 4 loose or watery stools. Low Fiber and/or BRAT diet - Broth, Rice, Applesause, Toast Return to Urgent Care or see Primary MD if worsening of symptoms or no improvement in 1 week.

## 2023-06-03 NOTE — ED Provider Notes (Signed)
[facility]    CSN: [id number] Arrival date & time: 06/03/23  1614      History   Chief Complaint Chief Complaint  Patient presents with   Emesis   Diarrhea   Nausea    HPI Catherine Nixon is a 22 y.o. female.  NVD x 4 days.  Reports fever on day 1 that has resolved.  N/V are improving but continues with diarrhea.  Denies any recent contact with other illness.     Emesis Associated symptoms: abdominal pain and diarrhea   Diarrhea Associated symptoms: abdominal pain and vomiting     Past Medical History:  Diagnosis Date   Abdominal pain, epigastric 09/15/2020   Medical history non-contributory    Post term pregnancy at [redacted] weeks gestation 10/12/2017   Vaginal delivery 10/13/2017    Patient Active Problem List   Diagnosis Date Noted   IUD (intrauterine device) in place 05/23/2022   IUD check up 05/23/2022   Encounter for IUD insertion 11/16/2021   Encounter for routine postpartum follow-up 11/16/2021   Postpartum anemia 10/22/2021   Post term pregnancy at [redacted] weeks gestation 10/20/2021   Language barrier affecting health care 09/13/2021   Supervision of other normal pregnancy, antepartum 04/18/2021   GBS bacteriuria 04/07/2021   SVD (spontaneous vaginal delivery) 10/13/2017    Past Surgical History:  Procedure Laterality Date   CHOLECYSTECTOMY N/A 11/15/2020   Procedure: LAPAROSCOPIC CHOLECYSTECTOMY;  Surgeon: Axel Filler, MD;  Location: [facility] OR;  Service: General;  Laterality: N/A;    OB History     Gravida  2   Para  2   Term  2   Preterm      AB      Living  2      SAB      IAB      Ectopic      Multiple  0   Live Births  2            Home Medications    Prior to Admission medications   Medication Sig Start Date End Date Taking? Authorizing Provider  ondansetron (ZOFRAN) 4 MG tablet Take 1 tablet (4 mg total) by mouth every 6 (six) hours as needed for up to 5 days for nausea or vomiting. 06/03/23 06/08/23 Yes  Jeremias Broyhill, Linde Gillis, NP  amoxicillin (AMOXIL) 875 MG tablet Take 1 tablet (875 mg total) by mouth 2 (two) times daily. 01/18/23   Cuthriell, Delorise Royals, PA-C  amoxicillin-clavulanate (AUGMENTIN) 875-125 MG tablet Take 1 tablet by mouth 2 (two) times daily. 09/26/22   [name], NP  fluconazole (DIFLUCAN) 150 MG tablet Take 1 tablet (150 mg total) by mouth daily. 09/26/22   [name], NP  lidocaine (XYLOCAINE) 2 % solution Use as directed 10 mLs in the mouth or throat every 4 (four) hours as needed for mouth pain. Swish, gargle, and spit 01/18/23   Cuthriell, Delorise Royals, PA-C  loperamide (IMODIUM A-D) 2 MG tablet Take 1 tablet (2 mg total) by mouth 4 (four) times daily as needed for up to 8 days for diarrhea or loose stools (Take if more than 4 stools in a 24hour period). 06/03/23 06/11/23  Anival Pasha, Linde Gillis, NP  PARAGARD INTRAUTERINE COPPER IU by Intrauterine route.    [provider]    Family History Family History  Problem Relation Age of Onset   Diabetes Father    Colon cancer Mother    Asthma Neg Hx    Heart disease Neg Hx  Hypertension Neg Hx    Stroke Neg Hx    Esophageal cancer Neg Hx    Pancreatic cancer Neg Hx    Liver disease Neg Hx     Social History Social History   Tobacco Use   Smoking status: Never    Passive exposure: Never   Smokeless tobacco: Never  Vaping Use   Vaping status: Never Used  Substance Use Topics   Alcohol use: Never   Drug use: Never     Allergies   Patient has no known allergies.   Review of Systems Review of Systems  Constitutional:  Positive for fatigue.  Gastrointestinal:  Positive for abdominal pain, diarrhea and vomiting.     Physical Exam Triage Vital Signs ED Triage Vitals  Encounter Vitals Group     BP 06/03/23 1719 112/72     Systolic BP Percentile --      Diastolic BP Percentile --      Pulse Rate 06/03/23 1719 70     Resp 06/03/23 1719 18     Temp 06/03/23 1719 98.3 F (36.8 C)     Temp  Source 06/03/23 1719 Oral     SpO2 06/03/23 1719 98 %     Weight --      Height --      Head Circumference --      Peak Flow --      Pain Score 06/03/23 1718 0     Pain Loc --      Pain Education --      Exclude from Growth Chart --    No data found.  Updated Vital Signs BP 112/72 (BP Location: Left Arm)   Pulse 70   Temp 98.3 F (36.8 C) (Oral)   Resp 18   LMP 05/25/2023 (Exact Date)   SpO2 98%   Visual Acuity Right Eye Distance:   Left Eye Distance:   Bilateral Distance:    Right Eye Near:   Left Eye Near:    Bilateral Near:     Physical Exam Constitutional:      Appearance: Normal appearance.  HENT:     Mouth/Throat:     Mouth: Mucous membranes are moist.  Cardiovascular:     Rate and Rhythm: Normal rate.     Pulses: Normal pulses.     Heart sounds: Normal heart sounds.  Abdominal:     General: Bowel sounds are normal.     Palpations: Abdomen is soft.     Tenderness: There is abdominal tenderness.     Comments: Mid/upper abdominal pain on palpation.   Neurological:     Mental Status: She is alert.      UC Treatments / Results  Labs (all labs ordered are listed, but only abnormal results are displayed) Labs Reviewed - No data to display  EKG   Radiology No results found.  Procedures Procedures (including critical care time)  Medications Ordered in UC Medications - No data to display  Initial Impression / Assessment and Plan / UC Course  I have reviewed the triage vital signs and the nursing notes.  Pertinent labs & imaging results that were available during my care of the patient were reviewed by me and considered in my medical decision making (see chart for details).     Noninfective Gastroenteritis with n/v/d Final Clinical Impressions(s) / UC Diagnoses   Final diagnoses:  Gastroenteritis  Nausea vomiting and diarrhea     Discharge Instructions      Continue hydration. Take Medications as  ordered. Loperamide may be take if  more than 4 loose or watery stools. Low Fiber and/or BRAT diet - Broth, Rice, Applesause, Toast Return to Urgent Care or see Primary MD if worsening of symptoms or no improvement in 1 week.     ED Prescriptions     Medication Sig Dispense Auth. Provider   ondansetron (ZOFRAN) 4 MG tablet Take 1 tablet (4 mg total) by mouth every 6 (six) hours as needed for up to 5 days for nausea or vomiting. 20 tablet Merlie Noga, Linde Gillis, NP   loperamide (IMODIUM A-D) 2 MG tablet Take 1 tablet (2 mg total) by mouth 4 (four) times daily as needed for up to 8 days for diarrhea or loose stools (Take if more than 4 stools in a 24hour period). 30 tablet Ann-Marie Kluge, Linde Gillis, NP      PDMP not reviewed this encounter.   Nelda Marseille, NP 06/03/23 [phone]

## 2023-06-05 ENCOUNTER — Ambulatory Visit: Payer: Self-pay | Admitting: Obstetrics and Gynecology

## 2023-06-17 ENCOUNTER — Ambulatory Visit (INDEPENDENT_AMBULATORY_CARE_PROVIDER_SITE_OTHER): Payer: Self-pay | Admitting: Women's Health

## 2023-06-17 ENCOUNTER — Other Ambulatory Visit (HOSPITAL_COMMUNITY)
Admission: RE | Admit: 2023-06-17 | Discharge: 2023-06-17 | Disposition: A | Discharge status: Home / Self Care | Payer: Self-pay | Source: Ambulatory Visit | Attending: Obstetrics and Gynecology | Admitting: Obstetrics and Gynecology

## 2023-06-17 ENCOUNTER — Encounter: Payer: Self-pay | Admitting: Women's Health

## 2023-06-17 VITALS — BP 96/62 | HR 65 | Ht <= 58 in | Wt 143.0 lb

## 2023-06-17 DIAGNOSIS — Z30432 Encounter for removal of intrauterine contraceptive device: Secondary | ICD-10-CM

## 2023-06-17 DIAGNOSIS — Z124 Encounter for screening for malignant neoplasm of cervix: Secondary | ICD-10-CM

## 2023-06-17 NOTE — Addendum Note (Signed)
Addended by: Federico Flake A on: 06/17/2023 03:59 PM   Modules accepted: Orders

## 2023-06-17 NOTE — Progress Notes (Signed)
   IUD REMOVAL  Patient name: Catherine Nixon MRN [id number]  Date of birth: [birth year] Subjective Findings:   Catherine Nixon is a 22 y.o. G64P2002 Hispanic female being seen today for removal of a Paragard  IUD. Her IUD was placed 04/06/22.  She desires removal because she wants to get pregnant. Taking pnv daily.  Signed copy of informed consent in chart.  Patient's last menstrual period was 05/25/2023 (exact date). Last pap 03/28/20. Results were: NILM w/ HRHPV not done, offered pap today vs RCHD- wants today The planned method of family planning is none     02/21/2022   10:24 AM 07/28/2021    9:39 AM 05/17/2021    3:31 PM 05/10/2021   11:26 AM 02/02/2021   10:21 AM  Depression screen PHQ 2/9  Decreased Interest 1 0 0 1 0  Down, Depressed, Hopeless 0 0 0 3 0  PHQ - 2 Score 1 0 0 4 0  Altered sleeping 1 2 0 2   Tired, decreased energy 2 2 2  0   Change in appetite 2 0 0 0   Feeling bad or failure about yourself  0 0 0 2   Trouble concentrating 1 2 0 0   Moving slowly or fidgety/restless 1 0 0 0   Suicidal thoughts 0 0 0 0   PHQ-9 Score 8 6 2 8    Difficult doing work/chores Somewhat difficult Not difficult at all Not difficult at all Somewhat difficult         02/21/2022   10:25 AM 07/28/2021    9:40 AM 05/17/2021    3:32 PM 05/10/2021   11:26 AM  GAD 7 : Generalized Anxiety Score  Nervous, Anxious, on Edge 1 0 1 1  Control/stop worrying 0 0 0 1  Worry too much - different things 1 0 1 1  Trouble relaxing 1 1 0 2  Restless 1 0 1 0  Easily annoyed or irritable 1 2 1 1   Afraid - awful might happen 0 0 0 0  Total GAD 7 Score 5 3 4 6   Anxiety Difficulty Somewhat difficult Not difficult at all Not difficult at all Somewhat difficult     Pertinent History Reviewed:   Reviewed past medical,surgical, social, obstetrical and family history.  Reviewed problem list, medications and allergies. Objective Findings & Procedure:    Vitals:   06/17/23 1530  BP: 96/62  Pulse: 65  Weight:  143 lb (64.9 kg)  Height: 4' (1.219 m)  Body mass index is 43.64 kg/m.  No results found for this or any previous visit (from the past 24 hour(s)).   Time out was performed.  A pederson speculum was placed in the vagina.  The cervix was visualized, thin prep pap obtained, and the strings were visible. They were grasped and the Paragard  IUD was easily removed intact without complications. The patient tolerated the procedure well.   Chaperone:  Holiday representative & Plan:   1) Paragard  IUD removal Follow-up prn problems, continue pnv, let us know when gets +HPT  2) Screening for cervical cancer> pap today  No orders of the defined types were placed in this encounter.   Follow-up: Return in about 1 year (around 06/16/2024) for Physical.  Cheral Marker CNM, WHNP-BC 06/17/2023 3:52 PM

## 2023-06-19 LAB — CYTOLOGY - PAP
Comment: NEGATIVE
Diagnosis: NEGATIVE
High risk HPV: NEGATIVE

## 2023-07-10 ENCOUNTER — Ambulatory Visit (INDEPENDENT_AMBULATORY_CARE_PROVIDER_SITE_OTHER): Payer: Self-pay | Admitting: *Deleted

## 2023-07-10 ENCOUNTER — Encounter: Payer: Self-pay | Admitting: *Deleted

## 2023-07-10 VITALS — BP 103/65 | HR 73 | Ht <= 58 in | Wt 142.4 lb

## 2023-07-10 DIAGNOSIS — Z3201 Encounter for pregnancy test, result positive: Secondary | ICD-10-CM

## 2023-07-10 LAB — POCT URINE PREGNANCY: Preg Test, Ur: POSITIVE — AB

## 2023-07-10 NOTE — Progress Notes (Signed)
   NURSE VISIT- PREGNANCY CONFIRMATION   SUBJECTIVE:  Catherine Nixon is a 22 y.o. G55P2002 female at [redacted]w[redacted]d by certain LMP of Patient's last menstrual period was 06/13/2023. Here for pregnancy confirmation.  Home pregnancy test: positive x 3.   She reports no complaints.  She is taking prenatal vitamins.    OBJECTIVE:  BP 103/65 (BP Location: Right Arm, Patient Position: Sitting, Cuff Size: Normal)   Pulse 73   Ht 4' (1.219 m)   Wt 142 lb 6.4 oz (64.6 kg)   LMP 06/13/2023   Breastfeeding Yes   BMI 43.45 kg/m   Appears well, in no apparent distress  Results for orders placed or performed in visit on 07/10/23 (from the past 24 hour(s))  POCT urine pregnancy   Collection Time: 07/10/23  2:59 PM  Result Value Ref Range   Preg Test, Ur Positive (A) Negative    ASSESSMENT: Positive pregnancy test, [redacted]w[redacted]d by LMP    PLAN: Schedule for dating ultrasound in 5 weeks Prenatal vitamins: continue   Nausea medicines: not currently needed   OB packet given: Yes  Malachy Mood  07/10/2023 2:59 PM

## 2023-08-13 ENCOUNTER — Other Ambulatory Visit: Payer: Self-pay | Admitting: Obstetrics & Gynecology

## 2023-08-13 DIAGNOSIS — O3680X0 Pregnancy with inconclusive fetal viability, not applicable or unspecified: Secondary | ICD-10-CM

## 2023-08-14 ENCOUNTER — Ambulatory Visit (INDEPENDENT_AMBULATORY_CARE_PROVIDER_SITE_OTHER): Payer: Medicaid Other

## 2023-08-14 DIAGNOSIS — Z3A08 8 weeks gestation of pregnancy: Secondary | ICD-10-CM

## 2023-08-14 DIAGNOSIS — O3680X0 Pregnancy with inconclusive fetal viability, not applicable or unspecified: Secondary | ICD-10-CM

## 2023-08-14 NOTE — Progress Notes (Signed)
[place] 8+6 wks,single IUP with yolk sac,CRL 24.88 mm,FHR 162 bpm,subchorionic hemorrhage 2.7 x 1.6 x 1.5 cm

## 2023-08-20 ENCOUNTER — Encounter (INDEPENDENT_AMBULATORY_CARE_PROVIDER_SITE_OTHER): Payer: Self-pay | Admitting: Primary Care

## 2023-08-22 ENCOUNTER — Ambulatory Visit (INDEPENDENT_AMBULATORY_CARE_PROVIDER_SITE_OTHER): Payer: Medicaid Other | Admitting: Primary Care

## 2023-09-10 ENCOUNTER — Encounter: Payer: Self-pay | Admitting: Advanced Practice Midwife

## 2023-09-10 ENCOUNTER — Other Ambulatory Visit: Payer: Self-pay | Admitting: Obstetrics & Gynecology

## 2023-09-10 DIAGNOSIS — Z3682 Encounter for antenatal screening for nuchal translucency: Secondary | ICD-10-CM

## 2023-09-10 DIAGNOSIS — Z3490 Encounter for supervision of normal pregnancy, unspecified, unspecified trimester: Secondary | ICD-10-CM | POA: Insufficient documentation

## 2023-09-11 ENCOUNTER — Ambulatory Visit: Payer: Medicaid Other | Admitting: Advanced Practice Midwife

## 2023-09-11 ENCOUNTER — Encounter: Payer: Self-pay | Admitting: Advanced Practice Midwife

## 2023-09-11 ENCOUNTER — Other Ambulatory Visit: Payer: Medicaid Other

## 2023-09-11 ENCOUNTER — Encounter: Payer: Medicaid Other | Admitting: *Deleted

## 2023-09-11 VITALS — BP 114/71 | HR 102 | Wt 142.0 lb

## 2023-09-11 DIAGNOSIS — Z6841 Body Mass Index (BMI) 40.0 and over, adult: Secondary | ICD-10-CM | POA: Diagnosis not present

## 2023-09-11 DIAGNOSIS — Z131 Encounter for screening for diabetes mellitus: Secondary | ICD-10-CM

## 2023-09-11 DIAGNOSIS — Z3481 Encounter for supervision of other normal pregnancy, first trimester: Secondary | ICD-10-CM

## 2023-09-11 DIAGNOSIS — Z363 Encounter for antenatal screening for malformations: Secondary | ICD-10-CM | POA: Diagnosis not present

## 2023-09-11 DIAGNOSIS — Z3A12 12 weeks gestation of pregnancy: Secondary | ICD-10-CM | POA: Diagnosis not present

## 2023-09-11 DIAGNOSIS — Z3480 Encounter for supervision of other normal pregnancy, unspecified trimester: Secondary | ICD-10-CM

## 2023-09-11 DIAGNOSIS — Z3682 Encounter for antenatal screening for nuchal translucency: Secondary | ICD-10-CM | POA: Diagnosis not present

## 2023-09-11 DIAGNOSIS — Z31430 Encounter of female for testing for genetic disease carrier status for procreative management: Secondary | ICD-10-CM | POA: Diagnosis not present

## 2023-09-11 MED ORDER — BLOOD PRESSURE MONITOR MISC
0 refills | Status: DC
Start: 2023-09-11 — End: 2024-03-27

## 2023-09-11 MED ORDER — PROMETHAZINE HCL 25 MG PO TABS: 25.0000 mg | ORAL_TABLET | Freq: Four times a day (QID) | ORAL | 1 refills | Status: DC | PRN

## 2023-09-11 NOTE — Progress Notes (Signed)
[place] 12+6 wks,measurements c/w dates,FHR 159 bpm,normal ovaries,NB present,NT 1.8 mm,posterior placenta,CRL 71.84 mm

## 2023-09-11 NOTE — Progress Notes (Signed)
INITIAL OBSTETRICAL VISIT Patient name: Catherine Nixon MRN [id number]  Date of birth: [birth year] Chief Complaint:   Initial Prenatal Visit (Nausea, headaches, dizziness)  History of Present Illness:   Catherine Nixon is a 22 y.o. G21P2002 Hispanic female at [redacted]w[redacted]d by LMP c/w u/s at 9.1 weeks with an Estimated Date of Delivery: 03/19/24 being seen today for her initial obstetrical visit.   Patient's last menstrual period was 06/13/2023. Her obstetrical history is significant for  term SVD x 2 .   Today she reports dizziness, headaches, and N/V.  Last pap Sept 2024. Results were: NILM w/ HRHPV negative     09/11/2023   11:12 AM 02/21/2022   10:24 AM 07/28/2021    9:39 AM 05/17/2021    3:31 PM 05/10/2021   11:26 AM  Depression screen PHQ 2/9  Decreased Interest 2 1 0 0 1  Down, Depressed, Hopeless 0 0 0 0 3  PHQ - 2 Score 2 1 0 0 4  Altered sleeping 2 1 2  0 2  Tired, decreased energy 3 2 2 2  0  Change in appetite 3 2 0 0 0  Feeling bad or failure about yourself  0 0 0 0 2  Trouble concentrating 2 1 2  0 0  Moving slowly or fidgety/restless 2 1 0 0 0  Suicidal thoughts 0 0 0 0 0  PHQ-9 Score 14 8 6 2 8   Difficult doing work/chores  Somewhat difficult Not difficult at all Not difficult at all Somewhat difficult        09/11/2023   11:13 AM 02/21/2022   10:25 AM 07/28/2021    9:40 AM 05/17/2021    3:32 PM  GAD 7 : Generalized Anxiety Score  Nervous, Anxious, on Edge 1 1 0 1  Control/stop worrying 0 0 0 0  Worry too much - different things 0 1 0 1  Trouble relaxing 2 1 1  0  Restless 2 1 0 1  Easily annoyed or irritable 3 1 2 1   Afraid - awful might happen 0 0 0 0  Total GAD 7 Score 8 5 3 4   Anxiety Difficulty  Somewhat difficult Not difficult at all Not difficult at all     Review of Systems:   Pertinent items are noted in HPI Denies cramping/contractions, leakage of fluid, vaginal bleeding, abnormal vaginal discharge w/ itching/odor/irritation, headaches, visual changes,  shortness of breath, chest pain, abdominal pain, severe nausea/vomiting, or problems with urination or bowel movements unless otherwise stated above.  Pertinent History Reviewed:  Reviewed past medical,surgical, social, obstetrical and family history.  Reviewed problem list, medications and allergies. OB History  Gravida Para Term Preterm AB Living  3 2 2     2   SAB IAB Ectopic Multiple Live Births        0 2    # Outcome Date GA Lbr Len/2nd Weight Sex Type Anes PTL Lv  3 Current           2 Term 10/21/21 [redacted]w[redacted]d 20:17 / 00:18 6 lb 8.1 oz (2.95 kg) F Vag-Spont EPI  LIV  1 Term 10/13/17 [redacted]w[redacted]d 04:04 / 00:40 6 lb 2.9 oz (2.805 kg) F Vag-Spont EPI N LIV   Physical Assessment:   Vitals:   09/11/23 1031  BP: 114/71  Pulse: (!) 102  Weight: 142 lb (64.4 kg)  Body mass index is 43.33 kg/m.       Physical Examination:  General appearance - well appearing, and in no distress  Mental status -  alert, oriented to person, place, and time  Psych:  She has a normal mood and affect  Skin - warm and dry, normal color, no suspicious lesions noted  Chest - effort normal, all lung fields clear to auscultation bilaterally  Heart - normal rate and regular rhythm  Abdomen - soft, nontender  Extremities:  No swelling or varicosities noted  Pelvic - not examined  Thin prep pap is not done   TODAY'S NT [place] 12+6 wks,measurements c/w dates,FHR 159 bpm,normal ovaries,NB present,NT 1.8 mm,posterior placenta,CRL 71.84 mm   No results found for this or any previous visit (from the past 24 hour(s)).  Assessment & Plan:  1) Low-Risk Pregnancy G3P2002 at [redacted]w[redacted]d with an Estimated Date of Delivery: 03/19/24   2) Initial OB visit  3) N/V, rx Phenergan  Meds:  Meds ordered this encounter  Medications   Blood Pressure Monitor MISC    Sig: For regular home bp monitoring during pregnancy    Dispense:  1 each    Refill:  0    Z34.81 Please mail to patient   promethazine (PHENERGAN) 25 MG tablet    Sig: Take  1 tablet (25 mg total) by mouth every 6 (six) hours as needed for nausea or vomiting.    Dispense:  30 tablet    Refill:  1    Order Specific Question:   Supervising Provider    Answer:   Myna Hidalgo [[id number]]    Initial labs obtained Continue prenatal vitamins Reviewed n/v relief measures and warning s/s to report Reviewed recommended weight gain based on pre-gravid BMI Encouraged well-balanced diet Genetic & carrier screening discussed: requests Panorama and NT/IT, requests Horizon  Ultrasound discussed; fetal survey: requested CCNC completed> form faxed if has or is planning to apply for medicaid The nature of [facility] [employer] with multiple MDs and other Advanced Practice Providers was explained to patient; also emphasized that fellows, residents, and students are part of our team. Does not have home bp cuff. Office bp cuff given: no. Rx sent: yes. Check bp weekly, let us know if consistently >140/90.   No indications for ASA therapy (per uptodate)  Indications for early A1C (per uptodate) BMI >=25 (>=23 in Asian women) AND one of the following High-risk race/ethnicity (eg, African American, Latino, Native American, Asian American, Pacific Islander) Yes   Follow-up: Return for 4wk LROB & 2nd IT; 8wk LROB & anatomy US.   Orders Placed This Encounter  Procedures   Urine Culture   GC/Chlamydia Probe Amp   US OB Comp + 14 Wk   Integrated 1   PANORAMA PRENATAL TEST   Hemoglobin A1c   CBC/D/Plt+RPR+Rh+ABO+RubIgG...   HORIZON CUSTOM    Arabella Merles CNM 09/11/2023 12:00 PM

## 2023-09-11 NOTE — Patient Instructions (Signed)
Catherine Nixon, thank you for choosing our office today! We appreciate the opportunity to meet your healthcare needs. You may receive a short survey by mail, e-mail, or through [employer]. If you are happy with your care we would appreciate if you could take just a few minutes to complete the survey questions. We read all of your comments and take your feedback very seriously. Thank you again for choosing our office.  Center for [employer] [facility] at [facility]  [facility] at [facility] ([address] [place], [place] [address]) Entrance C, located off of [address] Free 24/7 valet parking   Nausea & Vomiting Have saltine crackers or pretzels by your bed and eat a few bites before you raise your head out of bed in the morning Eat small frequent meals throughout the day instead of large meals Drink plenty of fluids throughout the day to stay hydrated, just don't drink a lot of fluids with your meals.  This can make your stomach fill up faster making you feel sick Do not brush your teeth right after you eat Products with real ginger are good for nausea, like ginger ale and ginger hard candy Make sure it says made with real ginger! Sucking on sour candy like lemon heads is also good for nausea If your prenatal vitamins make you nauseated, take them at night so you will sleep through the nausea Sea Bands If you feel like you need medicine for the nausea & vomiting please let us know If you are unable to keep any fluids or food down please let us know   Constipation Drink plenty of fluid, preferably water, throughout the day Eat foods high in fiber such as fruits, vegetables, and grains Exercise, such as walking, is a good way to keep your bowels regular Drink warm fluids, especially warm prune juice, or decaf coffee Eat a 1/2 cup of real oatmeal (not instant), 1/2 cup applesauce, and 1/2-1 cup warm prune juice every day If needed, you may take Colace (docusate sodium) stool softener  once or twice a day to help keep the stool soft.  If you still are having problems with constipation, you may take Miralax once daily as needed to help keep your bowels regular.   Home Blood Pressure Monitoring for Patients   Your provider has recommended that you check your blood pressure (BP) at least once a week at home. If you do not have a blood pressure cuff at home, one will be provided for you. Contact your provider if you have not received your monitor within 1 week.   Helpful Tips for Accurate Home Blood Pressure Checks  Don't smoke, exercise, or drink caffeine 30 minutes before checking your BP Use the restroom before checking your BP (a full bladder can raise your pressure) Relax in a comfortable upright chair Feet on the ground Left arm resting comfortably on a flat surface at the level of your heart Legs uncrossed Back supported Sit quietly and don't talk Place the cuff on your bare arm Adjust snuggly, so that only two fingertips can fit between your skin and the top of the cuff Check 2 readings separated by at least one minute Keep a log of your BP readings For a visual, please reference this diagram: http://ccnc.care/bpdiagram  Provider Name: [facility]     Phone: [phone]  Zone 1: ALL CLEAR  Continue to monitor your symptoms:  BP reading is less than 140 (top number) or less than 90 (bottom  number)  No right upper stomach pain No headaches or seeing spots No feeling nauseated or throwing up No swelling in face and hands  Zone 2: CAUTION Call your doctor's office for any of the following:  BP reading is greater than 140 (top number) or greater than 90 (bottom number)  Stomach pain under your ribs in the middle or right side Headaches or seeing spots Feeling nauseated or throwing up Swelling in face and hands  Zone 3: EMERGENCY  Seek immediate medical care if you have any of the following:  BP reading is greater than160 (top number) or greater than  110 (bottom number) Severe headaches not improving with Tylenol Serious difficulty catching your breath Any worsening symptoms from Zone 2    First Trimester of Pregnancy The first trimester of pregnancy is from week 1 until the end of week 12 (months 1 through 3). A week after a sperm fertilizes an egg, the egg will implant on the wall of the uterus. This embryo will begin to develop into a baby. Genes from you and your partner are forming the baby. The female genes determine whether the baby is a boy or a girl. At 6-8 weeks, the eyes and face are formed, and the heartbeat can be seen on ultrasound. At the end of 12 weeks, all the baby's organs are formed.  Now that you are pregnant, you will want to do everything you can to have a healthy baby. Two of the most important things are to get good prenatal care and to follow your health care provider's instructions. Prenatal care is all the medical care you receive before the baby's birth. This care will help prevent, find, and treat any problems during the pregnancy and childbirth. BODY CHANGES Your body goes through many changes during pregnancy. The changes vary from woman to woman.  You may gain or lose a couple of pounds at first. You may feel sick to your stomach (nauseous) and throw up (vomit). If the vomiting is uncontrollable, call your health care provider. You may tire easily. You may develop headaches that can be relieved by medicines approved by your health care provider. You may urinate more often. Painful urination may mean you have a bladder infection. You may develop heartburn as a result of your pregnancy. You may develop constipation because certain hormones are causing the muscles that push waste through your intestines to slow down. You may develop hemorrhoids or swollen, bulging veins (varicose veins). Your breasts may begin to grow larger and become tender. Your nipples may stick out more, and the tissue that surrounds them  (areola) may become darker. Your gums may bleed and may be sensitive to brushing and flossing. Dark spots or blotches (chloasma, mask of pregnancy) may develop on your face. This will likely fade after the baby is born. Your menstrual periods will stop. You may have a loss of appetite. You may develop cravings for certain kinds of food. You may have changes in your emotions from day to day, such as being excited to be pregnant or being concerned that something may go wrong with the pregnancy and baby. You may have more vivid and strange dreams. You may have changes in your hair. These can include thickening of your hair, rapid growth, and changes in texture. Some women also have hair loss during or after pregnancy, or hair that feels dry or thin. Your hair will most likely return to normal after your baby is born. WHAT TO EXPECT AT YOUR PRENATAL  VISITS During a routine prenatal visit: You will be weighed to make sure you and the baby are growing normally. Your blood pressure will be taken. Your abdomen will be measured to track your baby's growth. The fetal heartbeat will be listened to starting around week 10 or 12 of your pregnancy. Test results from any previous visits will be discussed. Your health care provider may ask you: How you are feeling. If you are feeling the baby move. If you have had any abnormal symptoms, such as leaking fluid, bleeding, severe headaches, or abdominal cramping. If you have any questions. Other tests that may be performed during your first trimester include: Blood tests to find your blood type and to check for the presence of any previous infections. They will also be used to check for low iron levels (anemia) and Rh antibodies. Later in the pregnancy, blood tests for diabetes will be done along with other tests if problems develop. Urine tests to check for infections, diabetes, or protein in the urine. An ultrasound to confirm the proper growth and development  of the baby. An amniocentesis to check for possible genetic problems. Fetal screens for spina bifida and Down syndrome. You may need other tests to make sure you and the baby are doing well. HOME CARE INSTRUCTIONS  Medicines Follow your health care provider's instructions regarding medicine use. Specific medicines may be either safe or unsafe to take during pregnancy. Take your prenatal vitamins as directed. If you develop constipation, try taking a stool softener if your health care provider approves. Diet Eat regular, well-balanced meals. Choose a variety of foods, such as meat or vegetable-based protein, fish, milk and low-fat dairy products, vegetables, fruits, and whole grain breads and cereals. Your health care provider will help you determine the amount of weight gain that is right for you. Avoid raw meat and uncooked cheese. These carry germs that can cause birth defects in the baby. Eating four or five small meals rather than three large meals a day may help relieve nausea and vomiting. If you start to feel nauseous, eating a few soda crackers can be helpful. Drinking liquids between meals instead of during meals also seems to help nausea and vomiting. If you develop constipation, eat more high-fiber foods, such as fresh vegetables or fruit and whole grains. Drink enough fluids to keep your urine clear or pale yellow. Activity and Exercise Exercise only as directed by your health care provider. Exercising will help you: Control your weight. Stay in shape. Be prepared for labor and delivery. Experiencing pain or cramping in the lower abdomen or low back is a good sign that you should stop exercising. Check with your health care provider before continuing normal exercises. Try to avoid standing for long periods of time. Move your legs often if you must stand in one place for a long time. Avoid heavy lifting. Wear low-heeled shoes, and practice good posture. You may continue to have sex  unless your health care provider directs you otherwise. Relief of Pain or Discomfort Wear a good support bra for breast tenderness.   Take warm sitz baths to soothe any pain or discomfort caused by hemorrhoids. Use hemorrhoid cream if your health care provider approves.   Rest with your legs elevated if you have leg cramps or low back pain. If you develop varicose veins in your legs, wear support hose. Elevate your feet for 15 minutes, 3-4 times a day. Limit salt in your diet. Prenatal Care Schedule your prenatal visits by the  twelfth week of pregnancy. They are usually scheduled monthly at first, then more often in the last 2 months before delivery. Write down your questions. Take them to your prenatal visits. Keep all your prenatal visits as directed by your health care provider. Safety Wear your seat belt at all times when driving. Make a list of emergency phone numbers, including numbers for family, friends, the hospital, and police and fire departments. General Tips Ask your health care provider for a referral to a local prenatal education class. Begin classes no later than at the beginning of month 6 of your pregnancy. Ask for help if you have counseling or nutritional needs during pregnancy. Your health care provider can offer advice or refer you to specialists for help with various needs. Do not use hot tubs, steam rooms, or saunas. Do not douche or use tampons or scented sanitary pads. Do not cross your legs for long periods of time. Avoid cat litter boxes and soil used by cats. These carry germs that can cause birth defects in the baby and possibly loss of the fetus by miscarriage or stillbirth. Avoid all smoking, herbs, alcohol, and medicines not prescribed by your health care provider. Chemicals in these affect the formation and growth of the baby. Schedule a dentist appointment. At home, brush your teeth with a soft toothbrush and be gentle when you floss. SEEK MEDICAL CARE IF:   You have dizziness. You have mild pelvic cramps, pelvic pressure, or nagging pain in the abdominal area. You have persistent nausea, vomiting, or diarrhea. You have a bad smelling vaginal discharge. You have pain with urination. You notice increased swelling in your face, hands, legs, or ankles. SEEK IMMEDIATE MEDICAL CARE IF:  You have a fever. You are leaking fluid from your vagina. You have spotting or bleeding from your vagina. You have severe abdominal cramping or pain. You have rapid weight gain or loss. You vomit blood or material that looks like coffee grounds. You are exposed to [place] measles and have never had them. You are exposed to fifth disease or chickenpox. You develop a severe headache. You have shortness of breath. You have any kind of trauma, such as from a fall or a car accident. Document Released: 09/18/2001 Document Revised: 02/08/2014 Document Reviewed: 08/04/2013 [facility] Patient Information 2015 [place], [place]. This information is not intended to replace advice given to you by your health care provider. Make sure you discuss any questions you have with your health care provider.

## 2023-09-12 ENCOUNTER — Encounter: Payer: Self-pay | Admitting: Advanced Practice Midwife

## 2023-09-12 DIAGNOSIS — O09899 Supervision of other high risk pregnancies, unspecified trimester: Secondary | ICD-10-CM | POA: Insufficient documentation

## 2023-09-13 DIAGNOSIS — Z3480 Encounter for supervision of other normal pregnancy, unspecified trimester: Secondary | ICD-10-CM | POA: Diagnosis not present

## 2023-09-13 LAB — INTEGRATED 1
Crown Rump Length: 71.8 mm
Gest. Age on Collection Date: 13.1 wk
Maternal Age at EDD: 25.2 a
Nuchal Translucency (NT): 1.8 mm
Number of Fetuses: 1
PAPP-A Value: 3160.5 ng/mL
Sonographer ID#: 309760
Weight: 142 [lb_av]

## 2023-09-13 LAB — CBC/D/PLT+RPR+RH+ABO+RUBIGG...
Antibody Screen: NEGATIVE
Basophils Absolute: 0 10*3/uL (ref 0.0–0.2)
Basos: 0 %
EOS (ABSOLUTE): 0 10*3/uL (ref 0.0–0.4)
Eos: 0 %
HCV Ab: NONREACTIVE
HIV Screen 4th Generation wRfx: NONREACTIVE
Hematocrit: 40.7 % (ref 34.0–46.6)
Hemoglobin: 12.7 g/dL (ref 11.1–15.9)
Hepatitis B Surface Ag: NEGATIVE
Immature Grans (Abs): 0.1 10*3/uL (ref 0.0–0.1)
Immature Granulocytes: 1 %
Lymphocytes Absolute: 1.3 10*3/uL (ref 0.7–3.1)
Lymphs: 19 %
MCH: 28 pg (ref 26.6–33.0)
MCHC: 31.2 g/dL — ABNORMAL LOW (ref 31.5–35.7)
MCV: 90 fL (ref 79–97)
Monocytes Absolute: 0.3 10*3/uL (ref 0.1–0.9)
Monocytes: 5 %
Neutrophils Absolute: 5.3 10*3/uL (ref 1.4–7.0)
Neutrophils: 75 %
Platelets: 283 10*3/uL (ref 150–450)
RBC: 4.53 x10E6/uL (ref 3.77–5.28)
RDW: 13.2 % (ref 11.7–15.4)
RPR Ser Ql: NONREACTIVE
Rh Factor: POSITIVE
Rubella Antibodies, IGG: <0.9 {index} — ABNORMAL LOW (ref >0.99)
WBC: 7.1 10*3/uL (ref 3.4–10.8)

## 2023-09-13 LAB — HCV INTERPRETATION

## 2023-09-13 LAB — HEMOGLOBIN A1C
Est. average glucose Bld gHb Est-mCnc: 114 mg/dL
Hgb A1c MFr Bld: 5.6 % (ref 4.8–5.6)

## 2023-09-18 LAB — PANORAMA PRENATAL TEST FULL PANEL:PANORAMA TEST PLUS 5 ADDITIONAL MICRODELETIONS: FETAL FRACTION: 10.1

## 2023-09-22 ENCOUNTER — Encounter (HOSPITAL_COMMUNITY): Payer: Self-pay | Admitting: Emergency Medicine

## 2023-09-22 ENCOUNTER — Other Ambulatory Visit: Payer: Self-pay

## 2023-09-22 ENCOUNTER — Ambulatory Visit (HOSPITAL_COMMUNITY)
Admission: EM | Admit: 2023-09-22 | Discharge: 2023-09-22 | Disposition: A | Discharge status: Home / Self Care | Payer: Medicaid Other | Attending: Emergency Medicine | Admitting: Emergency Medicine

## 2023-09-22 DIAGNOSIS — J069 Acute upper respiratory infection, unspecified: Secondary | ICD-10-CM

## 2023-09-22 HISTORY — DX: Encounter for supervision of normal pregnancy, unspecified, unspecified trimester: Z34.90

## 2023-09-22 LAB — HORIZON CUSTOM: REPORT SUMMARY: POSITIVE — AB

## 2023-09-22 LAB — POC COVID19/FLU A&B COMBO
Covid Antigen, POC: NEGATIVE
Influenza A Antigen, POC: NEGATIVE
Influenza B Antigen, POC: NEGATIVE

## 2023-09-22 NOTE — Discharge Instructions (Addendum)
You can continue to take 500 mg of Tylenol every 8 hours as needed for any fever.  Warm saline gargles, sleeping with a humidifier and over-the-counter methods will help with your sore throat.  Your symptoms should improve over the next 7 days or so, if no improvement or any changes please follow-up with your primary care provider or return to clinic.

## 2023-09-22 NOTE — ED Triage Notes (Signed)
Fever, sore throat, cough, bilateral ear pain for 2 days.   Reports fever 101 last night   Patient has not taken any medications, unsure what she can take-she is pregnant and EDC is June 12 , 2024.

## 2023-09-22 NOTE — ED Provider Notes (Signed)
[facility]    CSN: [id number] Arrival date & time: 09/22/23  1458      History   Chief Complaint Chief Complaint  Patient presents with   Cough    HPI Catherine Nixon is a 22 y.o. female.   Patient presents to clinic for fever, sore throat, cough, bilateral ear pain and feeling unwell for the past 2 days.  Fever was 101 last night.  She did take Tylenol.  She has been unsure what to take since she is pregnant.  She is a low risk pregnancy with estimated due date of 03/19/24.  No abdominal pain, vaginal bleeding or cramping.   The history is provided by the patient and medical records.  Cough   Past Medical History:  Diagnosis Date   Abdominal pain, epigastric 09/15/2020   Anemia    Medical history non-contributory    Pregnant     Patient Active Problem List   Diagnosis Date Noted   Rubella non-immune status, antepartum 09/12/2023   Encounter for supervision of normal pregnancy, antepartum 09/10/2023   Language barrier affecting health care 09/13/2021    Past Surgical History:  Procedure Laterality Date   CHOLECYSTECTOMY N/A 11/15/2020   Procedure: LAPAROSCOPIC CHOLECYSTECTOMY;  Surgeon: Axel Filler, MD;  Location: [facility] OR;  Service: General;  Laterality: N/A;    OB History     Gravida  3   Para  2   Term  2   Preterm      AB      Living  2      SAB      IAB      Ectopic      Multiple  0   Live Births  2            Home Medications    Prior to Admission medications   Medication Sig Start Date End Date Taking? Authorizing Provider  acetaminophen (TYLENOL) 500 MG tablet Take 500 mg by mouth every 6 (six) hours as needed.    [provider]  Blood Pressure Monitor MISC For regular home bp monitoring during pregnancy 09/11/23   Arabella Merles, CNM  Prenatal Vit-Fe Fumarate-FA (PRENATAL VITAMIN PO) Take by mouth.    [provider]  promethazine (PHENERGAN) 25 MG tablet Take 1 tablet (25 mg total) by  mouth every 6 (six) hours as needed for nausea or vomiting. Patient not taking: Reported on 09/22/2023 09/11/23   Arabella Merles, CNM    Family History Family History  Problem Relation Age of Onset   Colon cancer Mother    Diabetes Father    Anxiety disorder Sister    Asthma Neg Hx    Heart disease Neg Hx    Hypertension Neg Hx    Stroke Neg Hx    Esophageal cancer Neg Hx    Pancreatic cancer Neg Hx    Liver disease Neg Hx     Social History Social History   Tobacco Use   Smoking status: Never    Passive exposure: Never   Smokeless tobacco: Never  Vaping Use   Vaping status: Never Used  Substance Use Topics   Alcohol use: Never   Drug use: Never     Allergies   Patient has no known allergies.   Review of Systems Review of Systems  Per HPI   Physical Exam Triage Vital Signs ED Triage Vitals  Encounter Vitals Group     BP 09/22/23 1558 103/67     Systolic BP  Percentile --      Diastolic BP Percentile --      Pulse Rate 09/22/23 1558 (!) 104     Resp 09/22/23 1558 16     Temp 09/22/23 1558 98.3 F (36.8 C)     Temp Source 09/22/23 1558 Oral     SpO2 09/22/23 1558 98 %     Weight --      Height --      Head Circumference --      Peak Flow --      Pain Score 09/22/23 1556 6     Pain Loc --      Pain Education --      Exclude from Growth Chart --    No data found.  Updated Vital Signs BP 103/67 (BP Location: Right Arm)   Pulse (!) 104   Temp 98.3 F (36.8 C) (Oral)   Resp 16   LMP 06/13/2023   SpO2 98%   Visual Acuity Right Eye Distance:   Left Eye Distance:   Bilateral Distance:    Right Eye Near:   Left Eye Near:    Bilateral Near:     Physical Exam Vitals and nursing note reviewed.  Constitutional:      Appearance: Normal appearance.  HENT:     Head: Normocephalic and atraumatic.     Right Ear: Tympanic membrane, ear canal and external ear normal.     Left Ear: Tympanic membrane, ear canal and external ear normal.     Nose:  Nose normal.     Mouth/Throat:     Mouth: Mucous membranes are moist.     Pharynx: Posterior oropharyngeal erythema present.  Eyes:     Conjunctiva/sclera: Conjunctivae normal.  Cardiovascular:     Rate and Rhythm: Regular rhythm. Tachycardia present.     Heart sounds: Normal heart sounds. No murmur heard. Pulmonary:     Effort: Pulmonary effort is normal. No respiratory distress.     Breath sounds: Normal breath sounds.  Musculoskeletal:        General: Normal range of motion.  Skin:    General: Skin is warm and dry.  Neurological:     General: No focal deficit present.     Mental Status: She is alert and oriented to person, place, and time.  Psychiatric:        Mood and Affect: Mood normal.        Behavior: Behavior normal.      UC Treatments / Results  Labs (all labs ordered are listed, but only abnormal results are displayed) Labs Reviewed  POC COVID19/FLU A&B COMBO    EKG   Radiology No results found.  Procedures Procedures (including critical care time)  Medications Ordered in UC Medications - No data to display  Initial Impression / Assessment and Plan / UC Course  I have reviewed the triage vital signs and the nursing notes.  Pertinent labs & imaging results that were available during my care of the patient were reviewed by me and considered in my medical decision making (see chart for details).  Vitals and triage reviewed, patient is hemodynamically stable.  She is about [redacted] weeks gestation.  Symptoms consistent with a viral URI, COVID and flu testing negative.  Lungs are vesicular, heart with regular rate and rhythm.  Suspect other viral etiology to symptoms.  Symptomatic management discussed.  Medications safe in pregnancy reviewed.  Plan of care, follow-up care return precautions given, no questions at this time.  Final Clinical Impressions(s) / UC Diagnoses   Final diagnoses:  Viral URI with cough     Discharge Instructions      You can  continue to take 500 mg of Tylenol every 8 hours as needed for any fever.  Warm saline gargles, sleeping with a humidifier and over-the-counter methods will help with your sore throat.  Your symptoms should improve over the next 7 days or so, if no improvement or any changes please follow-up with your primary care provider or return to clinic.     ED Prescriptions   None    PDMP not reviewed this encounter.   Sharhonda Atwood, [place], [place] 09/22/23 [phone]

## 2023-09-23 ENCOUNTER — Telehealth: Payer: Medicaid Other

## 2023-09-23 ENCOUNTER — Encounter: Payer: Self-pay | Admitting: *Deleted

## 2023-09-24 ENCOUNTER — Ambulatory Visit
Admission: EM | Admit: 2023-09-24 | Discharge: 2023-09-24 | Disposition: A | Discharge status: Home / Self Care | Payer: Medicaid Other

## 2023-09-24 DIAGNOSIS — Z20828 Contact with and (suspected) exposure to other viral communicable diseases: Secondary | ICD-10-CM

## 2023-09-24 DIAGNOSIS — J069 Acute upper respiratory infection, unspecified: Secondary | ICD-10-CM

## 2023-09-24 NOTE — Discharge Instructions (Addendum)
You have a viral upper respiratory infection.  Symptoms should improve over the next week to 10 days.  If you develop chest pain or shortness of breath, go to the emergency room.  OTC Medications safe in pregnancy: - acetaminophen - cepacol throat lozenges - Coricidin HBP: chest congestion/cough, cold/flu - alcohol free cough drops - dextromethorphan - guaifenesin - Neti pot/saline nasal rinses - Vicks vapo rub   Some things that can make you feel better are: - Increased rest - Increasing fluid with water/sugar free electrolytes - Acetaminophen as needed for fever/pain - Salt water gargling, chloraseptic spray and throat lozenges for sore throat - OTC guaifenesin (Mucinex) 600 mg twice daily for congestion - Saline sinus flushes or a neti pot - Humidifying the air

## 2023-09-24 NOTE — ED Provider Notes (Signed)
[facility]    CSN: [id number] Arrival date & time: 09/24/23  1255      History   Chief Complaint Chief Complaint  Patient presents with   Fever   Cough   Generalized Body Aches    HPI Catherine Nixon is a 22 y.o. female.   Patient presents today with 4-day history of fever, cough, runny and stuffy nose, burning in her chest when she coughs, scratchy throat, nausea without vomiting, decreased appetite.  She reports Tmax 1 1.5 F.  No shortness of breath, chest pain, sinus pressure, headache, ear pain, abdominal pain, vomiting, or diarrhea.  Reports her appetite has been decreased since she has been sick.  She is currently [redacted] weeks pregnant and denies any abdominal cramping or vaginal bleeding.  Reports her husband and children tested positive for influenza today.  She was seen 2 days ago in urgent care and tested negative for flu and COVID-19.  Has taken Tylenol for symptoms but nothing else so far.    Past Medical History:  Diagnosis Date   Abdominal pain, epigastric 09/15/2020   Anemia    Medical history non-contributory    Pregnant     Patient Active Problem List   Diagnosis Date Noted   Rubella non-immune status, antepartum 09/12/2023   Encounter for supervision of normal pregnancy, antepartum 09/10/2023   Language barrier affecting health care 09/13/2021    Past Surgical History:  Procedure Laterality Date   CHOLECYSTECTOMY N/A 11/15/2020   Procedure: LAPAROSCOPIC CHOLECYSTECTOMY;  Surgeon: Axel Filler, MD;  Location: [facility] OR;  Service: General;  Laterality: N/A;    OB History     Gravida  3   Para  2   Term  2   Preterm      AB      Living  2      SAB      IAB      Ectopic      Multiple  0   Live Births  2            Home Medications    Prior to Admission medications   Medication Sig Start Date End Date Taking? Authorizing Provider  acetaminophen (TYLENOL) 500 MG tablet Take 500 mg by mouth every 6 (six) hours as  needed.   Yes [provider]  Blood Pressure Monitor MISC For regular home bp monitoring during pregnancy 09/11/23  Yes Arabella Merles, CNM  Prenatal Vit-Fe Fumarate-FA (PRENATAL VITAMIN PO) Take by mouth.   Yes [provider]  promethazine (PHENERGAN) 25 MG tablet Take 1 tablet (25 mg total) by mouth every 6 (six) hours as needed for nausea or vomiting. Patient not taking: Reported on 09/22/2023 09/11/23   Arabella Merles, CNM    Family History Family History  Problem Relation Age of Onset   Colon cancer Mother    Diabetes Father    Anxiety disorder Sister    Asthma Neg Hx    Heart disease Neg Hx    Hypertension Neg Hx    Stroke Neg Hx    Esophageal cancer Neg Hx    Pancreatic cancer Neg Hx    Liver disease Neg Hx     Social History Social History   Tobacco Use   Smoking status: Never    Passive exposure: Never   Smokeless tobacco: Never  Vaping Use   Vaping status: Never Used  Substance Use Topics   Alcohol use: Never   Drug use: Never  Allergies   Patient has no known allergies.   Review of Systems Review of Systems Per HPI  Physical Exam Triage Vital Signs ED Triage Vitals [09/24/23 1445]  Encounter Vitals Group     BP 127/80     Systolic BP Percentile      Diastolic BP Percentile      Pulse Rate (!) 118     Resp 18     Temp 100.2 F (37.9 C)     Temp Source Oral     SpO2 98 %     Weight      Height      Head Circumference      Peak Flow      Pain Score      Pain Loc      Pain Education      Exclude from Growth Chart    No data found.  Updated Vital Signs BP 127/80 (BP Location: Left Arm)   Pulse (!) 118   Temp 100.2 F (37.9 C) (Oral)   Resp 18   LMP 06/13/2023   SpO2 98%   Visual Acuity Right Eye Distance:   Left Eye Distance:   Bilateral Distance:    Right Eye Near:   Left Eye Near:    Bilateral Near:     Physical Exam Vitals and nursing note reviewed.  Constitutional:      General: She is not in  acute distress.    Appearance: Normal appearance. She is not ill-appearing or toxic-appearing.  HENT:     Head: Normocephalic and atraumatic.     Right Ear: Tympanic membrane, ear canal and external ear normal.     Left Ear: Tympanic membrane, ear canal and external ear normal.     Nose: Congestion present. No rhinorrhea.     Mouth/Throat:     Mouth: Mucous membranes are moist.     Pharynx: Oropharynx is clear. No oropharyngeal exudate or posterior oropharyngeal erythema.  Eyes:     General: No scleral icterus.    Extraocular Movements: Extraocular movements intact.  Cardiovascular:     Rate and Rhythm: Regular rhythm. Tachycardia present.  Pulmonary:     Effort: Pulmonary effort is normal. No respiratory distress.     Breath sounds: Normal breath sounds. No wheezing, rhonchi or rales.  Musculoskeletal:     Cervical back: Normal range of motion and neck supple.  Lymphadenopathy:     Cervical: No cervical adenopathy.  Skin:    General: Skin is warm and dry.     Coloration: Skin is not jaundiced or pale.     Findings: No erythema or rash.  Neurological:     Mental Status: She is alert and oriented to person, place, and time.  Psychiatric:        Behavior: Behavior is cooperative.      UC Treatments / Results  Labs (all labs ordered are listed, but only abnormal results are displayed) Labs Reviewed - No data to display  EKG   Radiology No results found.  Procedures Procedures (including critical care time)  Medications Ordered in UC Medications - No data to display  Initial Impression / Assessment and Plan / UC Course  I have reviewed the triage vital signs and the nursing notes.  Pertinent labs & imaging results that were available during my care of the patient were reviewed by me and considered in my medical decision making (see chart for details).   Patient is well-appearing, normotensive, afebrile, not tachycardic, not tachypneic, oxygenating well  on room air.     1. Viral URI with cough 2. Exposure to influenza Suspect ongoing viral etiology We discussed over-the-counter medications that are safe in pregnancy and handout was given today Suspect influenza given known exposures, however patient is out of window for treatment Supportive care discussed ER and return precautions discussed  The patient was given the opportunity to ask questions.  All questions answered to their satisfaction.  The patient is in agreement to this plan.    Final Clinical Impressions(s) / UC Diagnoses   Final diagnoses:  Viral URI with cough  Exposure to influenza     Discharge Instructions      You have a viral upper respiratory infection.  Symptoms should improve over the next week to 10 days.  If you develop chest pain or shortness of breath, go to the emergency room.  OTC Medications safe in pregnancy: - acetaminophen - cepacol throat lozenges - Coricidin HBP: chest congestion/cough, cold/flu - alcohol free cough drops - dextromethorphan - guaifenesin - Neti pot/saline nasal rinses - Vicks vapo rub   Some things that can make you feel better are: - Increased rest - Increasing fluid with water/sugar free electrolytes - Acetaminophen as needed for fever/pain - Salt water gargling, chloraseptic spray and throat lozenges for sore throat - OTC guaifenesin (Mucinex) 600 mg twice daily for congestion - Saline sinus flushes or a neti pot - Humidifying the air     ED Prescriptions   None    PDMP not reviewed this encounter.   Valentino Nose, NP 09/24/23 [phone]

## 2023-09-24 NOTE — ED Triage Notes (Signed)
Patient reports fever, chills and body ache x 3 days. Patient currently [redacted] weeks pregnant.

## 2023-09-30 ENCOUNTER — Telehealth (INDEPENDENT_AMBULATORY_CARE_PROVIDER_SITE_OTHER): Payer: Self-pay | Admitting: Primary Care

## 2023-09-30 NOTE — Telephone Encounter (Signed)
Called to inform pt about apt. VM left pt did not answer

## 2023-10-01 ENCOUNTER — Ambulatory Visit (INDEPENDENT_AMBULATORY_CARE_PROVIDER_SITE_OTHER): Payer: Medicaid Other | Admitting: Primary Care

## 2023-10-09 NOTE — L&D Delivery Note (Signed)
 Delivery Note At 6:15 PM a healthy female was delivered via Vaginal, Spontaneous (Presentation: Left Occiput Anterior).  APGAR: 9, 9; weight  .   Placenta status: Spontaneous, Intact.  Cord: 3 vessels with the following complications: None.    Anesthesia: Epidural Episiotomy: None Lacerations: Periurethral Suture Repair: 4.0 Monocryl Est. Blood Loss (mL): 262  Mom to postpartum.  Baby to Couplet care / Skin to Skin.  Marletta Simmering 03/25/2024, 7:10 PM

## 2023-10-10 ENCOUNTER — Ambulatory Visit (INDEPENDENT_AMBULATORY_CARE_PROVIDER_SITE_OTHER): Payer: Medicaid Other | Admitting: Women's Health

## 2023-10-10 ENCOUNTER — Encounter: Payer: Self-pay | Admitting: Women's Health

## 2023-10-10 VITALS — BP 96/60 | HR 71 | Ht 60.0 in | Wt 144.4 lb

## 2023-10-10 DIAGNOSIS — Z3A17 17 weeks gestation of pregnancy: Secondary | ICD-10-CM

## 2023-10-10 DIAGNOSIS — Z1379 Encounter for other screening for genetic and chromosomal anomalies: Secondary | ICD-10-CM | POA: Diagnosis not present

## 2023-10-10 DIAGNOSIS — Z3A12 12 weeks gestation of pregnancy: Secondary | ICD-10-CM | POA: Diagnosis not present

## 2023-10-10 DIAGNOSIS — O285 Abnormal chromosomal and genetic finding on antenatal screening of mother: Secondary | ICD-10-CM

## 2023-10-10 DIAGNOSIS — Z3482 Encounter for supervision of other normal pregnancy, second trimester: Secondary | ICD-10-CM

## 2023-10-10 DIAGNOSIS — Z3480 Encounter for supervision of other normal pregnancy, unspecified trimester: Secondary | ICD-10-CM

## 2023-10-10 DIAGNOSIS — Z363 Encounter for antenatal screening for malformations: Secondary | ICD-10-CM

## 2023-10-10 NOTE — Progress Notes (Addendum)
 LOW-RISK PREGNANCY VISIT Patient name: Catherine Nixon MRN [id number]  Date of birth: 2001/06/23 Chief Complaint:   Routine Prenatal Visit (Urine culture / 2 nd IT)  History of Present Illness:   Catherine Nixon is a 23 y.o. G69P2002 female at [redacted]w[redacted]d with an Estimated Date of Delivery: 03/19/24 being seen today for ongoing management of a low-risk pregnancy.   Today she reports no complaints. Contractions: Not present.  .  Movement: Absent. denies leaking of fluid.     09/11/2023   11:12 AM 02/21/2022   10:24 AM 07/28/2021    9:39 AM 05/17/2021    3:31 PM 05/10/2021   11:26 AM  Depression screen PHQ 2/9  Decreased Interest 2 1 0 0 1  Down, Depressed, Hopeless 0 0 0 0 3  PHQ - 2 Score 2 1 0 0 4  Altered sleeping 2 1 2  0 2  Tired, decreased energy 3 2 2 2  0  Change in appetite 3 2 0 0 0  Feeling bad or failure about yourself  0 0 0 0 2  Trouble concentrating 2 1 2  0 0  Moving slowly or fidgety/restless 2 1 0 0 0  Suicidal thoughts 0 0 0 0 0  PHQ-9 Score 14 8 6 2 8   Difficult doing work/chores  Somewhat difficult Not difficult at all Not difficult at all Somewhat difficult        09/11/2023   11:13 AM 02/21/2022   10:25 AM 07/28/2021    9:40 AM 05/17/2021    3:32 PM  GAD 7 : Generalized Anxiety Score  Nervous, Anxious, on Edge 1 1 0 1  Control/stop worrying 0 0 0 0  Worry too much - different things 0 1 0 1  Trouble relaxing 2 1 1  0  Restless 2 1 0 1  Easily annoyed or irritable 3 1 2 1   Afraid - awful might happen 0 0 0 0  Total GAD 7 Score 8 5 3 4   Anxiety Difficulty  Somewhat difficult Not difficult at all Not difficult at all      Review of Systems:   Pertinent items are noted in HPI Denies abnormal vaginal discharge w/ itching/odor/irritation, headaches, visual changes, shortness of breath, chest pain, abdominal pain, severe nausea/vomiting, or problems with urination or bowel movements unless otherwise stated above. Pertinent History Reviewed:  Reviewed past  medical,surgical, social, obstetrical and family history.  Reviewed problem list, medications and allergies. Physical Assessment:   Vitals:   10/10/23 0924 10/10/23 0944  BP: 96/60   Pulse: 71   Weight: 144 lb 6.4 oz (65.5 kg)   Height:  5' (1.524 m)  Body mass index is 28.2 kg/m.        Physical Examination:   General appearance: Well appearing, and in no distress  Mental status: Alert, oriented to person, place, and time  Skin: Warm & dry  Cardiovascular: Normal heart rate noted  Respiratory: Normal respiratory effort, no distress  Abdomen: Soft, gravid, nontender  Pelvic: Cervical exam deferred         Extremities: Edema: None  Fetal Status: Fetal Heart Rate (bpm): 139   Movement: Absent    Chaperone: N/A   No results found for this or any previous visit (from the past 24 hours).  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  Labs/procedures today: declines flu shot today, maybe next visit, gc/ct and urine cx today (not done last visit)  Plan:  Continue routine obstetrical care  Next visit: prefers will be in person for u/s     Reviewed: Preterm labor symptoms and general obstetric precautions including but not limited to vaginal bleeding, contractions, leaking of fluid and fetal movement were reviewed in detail with the patient.  All questions were answered. Does have home bp cuff. Office bp cuff given: not applicable. Check bp weekly, let us  know if consistently >140 and/or >90.  Follow-up: Return for As scheduled.  Future Appointments  Date Time Provider Department Center  11/06/2023  8:30 AM [facility]  [facility] None  11/06/2023  9:50 AM Kizzie Suzen SAUNDERS, CNM [facility]    Orders Placed This Encounter  Procedures   INTEGRATED 2   Suzen SAUNDERS Kizzie  CNM, [facility] 10/10/2023 9:45 AM

## 2023-10-10 NOTE — Patient Instructions (Signed)
 Catherine Nixon, thank you for choosing our office today! We appreciate the opportunity to meet your healthcare needs. You may receive a short survey by mail, e-mail, or through [employer]. If you are happy with your care we would appreciate if you could take just a few minutes to complete the survey questions. We read all of your comments and take your feedback very seriously. Thank you again for choosing our office.  Center for [employer] Team at [facility] at [facility] ([address] [place], [place] [address]) Entrance C, located off of [address] Free 24/7 valet parking  Go to [email or web address] to register for FREE online childbirth classes  Call the office [phone]) or go to [facility] if: You begin to severe cramping Your water  breaks.  Sometimes it is a big gush of fluid, sometimes it is just a trickle that keeps getting your panties wet or running down your legs You have vaginal bleeding.  It is normal to have a small amount of spotting if your cervix was checked.   [facility] Pediatricians/Family Doctors [facility]): [address] Luba BROCKS, [phone]           [facility]: [address], [phone]                [facility]): [address], [phone] (call to ask if accepting patients) [facility] Department: [address], [place], [phone]    [facility] Pediatricians/Family Doctors [facility]): [phone] [address], [phone] [facility]: [address] [place], [phone] [facility]: [address], [phone]  [facility] Doctors  [facility]): [phone] [facility]: [address] [phone]   [facility] Doctors [facility]: [address] [phone]  [facility] Doctors  [facility]: [phone], [phone]  Home Blood Pressure Monitoring for Patients   Your provider has recommended that you check your blood pressure (BP) at least once a week at home. If you do not have a blood pressure cuff at home, one will be provided for you. Contact your provider if you have not received your monitor within 1 week.   Helpful Tips for Accurate Home Blood Pressure Checks  Don't smoke, exercise, or drink caffeine 30 minutes before checking your BP Use the restroom before checking your BP (a full bladder can raise your pressure) Relax in a comfortable upright chair Feet on the ground Left arm resting comfortably on a flat surface at the level of your heart Legs uncrossed Back supported Sit quietly and don't talk Place the cuff on your bare arm Adjust snuggly, so that only two fingertips can fit between your skin and the top of the cuff Check 2 readings separated by at least one minute Keep a log of your BP readings For a visual, please reference this diagram: http://ccnc.care/bpdiagram  Provider Name: [facility]     Phone: [phone]  Zone 1: ALL CLEAR  Continue to monitor your symptoms:  BP reading is less than 140 (top number) or less than 90 (bottom number)  No right upper stomach pain No headaches or seeing spots No feeling nauseated or throwing up No swelling in face and hands  Zone 2: CAUTION Call your doctor's office for any of the following:  BP reading is greater than 140 (top number) or greater than  90 (bottom number)  Stomach pain under your ribs in the middle or right side Headaches or seeing spots Feeling nauseated or throwing up Swelling in face and hands  Zone 3: EMERGENCY  Seek immediate medical care if you have any of the following:  BP reading is greater than160 (top number) or greater than 110 (bottom number) Severe headaches not improving with Tylenol  Serious difficulty catching your breath Any worsening symptoms from Zone 2      Orient trimestre de [employer] Trimester of Pregnancy  El segundo trimestre de psychiatrist va desde la semana 13 hasta la semana 27. Es designer, jewellery desde el mes 4 hasta el mes 6 de embarazo. El segundo trimestre suele ser el momento en el que mejor se siente. Su organismo se ha adaptado a estar embarazada, y comienza a diplomatic services operational officer. Durante el segundo trimestre: Las nuseas del embarazo han disminuido o han desaparecido completamente. Usted puede tener ms energa. Es posible que tenga un aumento del apetito. El segundo trimestre es tambin un perodo en el que el beb en gestacin (feto) crece rpidamente. Hacia el final del sexto mes, el feto puede medir aproximadamente 12 pulgadas y pesar alrededor de 1 libras. Es probable que sienta que el beb se mueve (da pataditas) entre las 16 y 20 semanas del psychiatrist. Cambios en el cuerpo durante el segundo trimestre Su cuerpo continua experimentando numerosos cambios durante su segundo trimestre. Los cambios varan y generalmente vuelven a la normalidad despus del nacimiento del beb. Cambios fsicos Seguir american standard companies. Notar que la parte baja del abdomen sobresale. Podrn aparecer las primeras estras en las caderas, el abdomen y las [place]. Las conagra foods seguirn creciendo y se tornarn sensibles. Pueden aparecer zonas oscuras o manchas (cloasma o mscara del embarazo) en el rostro. Es posible que se forme una lnea oscura desde el ombligo hasta la zona del pubis (linea nigra). Tal vez haya cambios en el cabello. Esto cambios pueden incluir su engrosamiento, crecimiento rpido y allied waste industries textura. A algunas personas tambin se les cae el cabello durante o despus del [place], o tienen el cabello seco o fino. Cambios en la salud Comienza a tener dolores de cabeza. Es posible que tenga acidez estomacal. Puede tener estreimiento. Pueden aparecer hemorroides o abultarse e hincharse las venas (venas varicosas). Las data processing manager y estar sensibles al cepillado y al hilo dental. Refugio vez tenga necesidad de orinar con ms frecuencia porque el feto est ejerciendo presin sobre la vejiga. Puede sentir dolor en la espalda. Esto se debe a: Aumento de peso. Las hormonas del management consultant las articulaciones en la pelvis. Un cambio en el peso y los msculos que ayudan a pharmacologist su equilibrio. Siga estas instrucciones en su casa: Medicamentos Siga las instrucciones del mdico en relacin con el uso de medicamentos. Durante el embarazo, hay medicamentos que pueden tomarse y otros que no. No tome medicamentos a menos que lo haya autorizado el mdico. Tome vitaminas prenatales que contengan por lo menos 600 microgramos (mcg) de cido flico. Comida y bebida Lleve una dieta saludable que incluya frutas y verduras frescas, cereales integrales, buenas fuentes de protenas como carnes [place], huevos o tofu, y productos lcteos descremados. Evite la carne cruda y el [place], la [place] y el queso sin market researcher. Estos portan grmenes que pueden provocar dao tanto a usted como al beb. Es posible que tenga que tomar estas medidas para prevenir o tratar el estreimiento: Product Manager suficiente lquido como para pharmacologist la orina de color  amarillo plido. Consumir alimentos ricos en fibra, como frijoles, cereales integrales, y frutas y verduras frescas. Limitar el consumo de alimentos ricos en grasa y azcares procesados, como los alimentos fritos o dulces. Actividad Haga ejercicio solamente como se lo haya indicado el mdico. La mayora de las personas pueden continuar su rutina de ejercicios durante el embarazo. Intente realizar como mnimo 30 minutos de actividad fsica por lo menos 5 das a la [place]. Deje de hacer ejercicio si comienza a teacher, music. Deje de hacer ejercicio si le aparecen dolor o clicos en la parte baja del vientre o de la espalda. Evite hacer ejercicio si hace mucho calor o humedad, o si  se encuentra a una altitud elevada. Evite levantar pesos fortune brands. Si lo desea, puede seguir teniendo the st. paul travelers, salvo que el mdico le indique lo contrario. Alivio del dolor y del dentist Use un sujetador que le brinde buen soporte para prevenir las molestias causadas por la sensibilidad en las [place]. Dese baos de asiento con agua tibia para engineer, materials o las molestias causadas por las hemorroides. Use una crema para las hemorroides si el mdico la autoriza. Descanse con las piernas levantadas (elevadas) si tiene calambres en las piernas o dolor en la parte baja de la espalda. Si tiene venas varicosas: Use medias de compresin como se lo haya indicado el mdico. Eleve los pies durante 15 minutos, 3 o 4 veces por da. Limite el consumo de sal en su dieta. Seguridad Use el cinturn de seguridad en todo momento mientras conduce o va en auto. Hable con el mdico si es vctima de genuine parts o fsico. Estilo de vida No se d baos de inmersin en agua caliente, baos turcos ni saunas. No se haga duchas vaginales. No use tampones ni toallas higinicas perfumadas. Evite el contacto con las bandejas sanitarias de los gatos y la tierra que estos animales usan. Estos elementos contienen bacterias que pueden causar defectos congnitos al beb y la posible prdida del feto debido a un aborto espontneo o muerte fetal. No use remedios a base de hierbas, alcohol, drogas ilegales ni medicamentos que no estn aprobados por el mdico. Las sustancias qumicas de estos productos pueden daar al beb. No consuma ningn producto que contenga nicotina o tabaco, como cigarrillos, cigarrillos electrnicos y tabaco de mascar. Si necesita ayuda para dejar de fumar, consulte al mdico. Instrucciones generales Durante una visita prenatal de rutina, el office depot har un examen fsico y probation officer. Tambin le hablar sobre su salud general. Cumpla con todas las visitas de seguimiento. Esto es  importante. Pdale al mdico que la derive a clases de educacin prenatal en su localidad. Pida ayuda si tiene necesidades nutricionales o de asesoramiento durante el embarazo. El mdico puede aconsejarla o derivarla a especialistas para que la ayuden con diferentes necesidades. Dnde buscar ms informacin American Pregnancy Association (Asociacin Americana del Embarazo): americanpregnancy.org [employer] of Obstetricians and Gynecologists (Colegio Estadounidense de Obstetras y [place]): employmentassurance.cz? Office on [employer] (Oficina para la Salud de la Mujer): mightyreward.co.nz Comunquese con un mdico si tiene: Un dolor de cabeza que no desaparece despus de science writer. Cambios en la visin o ve manchas delante de los ojos. Clicos leves, presin en la pelvis o dolor persistente en el abdomen. Nuseas persistentes, vmitos o diarrea. Secrecin vaginal con mal olor u orina con olor ftido. Dolor al francisco. Hinchazn sbita o extrema del rostro, las [address], los tobillos, los pies o las piernas. [place]. Busque ayuda de inmediato  si: Tiene una prdida de lquido por la vagina. Tiene sangrado ligero o manchas vaginales. Tiene dolor intenso o clicos en el abdomen. Presenta dificultad para respirar. Siente dolor en el pecho. Tiene episodios de desmayo. No ha sentido a su beb moverse durante el perodo de sempra energy indic el mdico. Tiene dolor, hinchazn o enrojecimiento nuevos o ms intensos en un brazo o una pierna. Resumen El segundo trimestre de embarazo va desde la semana 13 hasta la 27 (desde el mes 4 hasta el 6). No use remedios a base de hierbas, alcohol, drogas ilegales ni medicamentos que no estn aprobados por el mdico. Las sustancias qumicas de estos productos pueden daar al beb. Haga ejercicio solamente como se lo haya indicado el mdico. La mayora de las personas pueden continuar su rutina de ejercicios durante el  embarazo. Cumpla con todas las visitas de seguimiento. Esto es importante. Esta informacin no tiene theme park manager el consejo del mdico. Asegrese de hacerle al mdico cualquier pregunta que tenga. Document Revised: 04/04/2020 Document Reviewed: 04/04/2020 Elsevier Patient Education  2024 [employer].

## 2023-10-12 LAB — GC/CHLAMYDIA PROBE AMP
Chlamydia trachomatis, NAA: NEGATIVE
Neisseria Gonorrhoeae by PCR: NEGATIVE

## 2023-10-13 LAB — INTEGRATED 2
AFP MoM: 0.67
Alpha-Fetoprotein: 25.8 ng/mL
Crown Rump Length: 71.8 mm
DIA MoM: 1.01
DIA Value: 168.6 pg/mL
Estriol, Unconjugated: 1.92 ng/mL
Gest. Age on Collection Date: 13.1 wk
Gestational Age: 17.3 wk
Maternal Age at EDD: 25.2 a
Nuchal Translucency (NT): 1.8 mm
Nuchal Translucency MoM: 0.89
Number of Fetuses: 1
PAPP-A MoM: 2.39
PAPP-A Value: 3160.5 ng/mL
Sonographer ID#: 309760
Test Results:: NEGATIVE
Weight: 142 [lb_av]
Weight: 142 [lb_av]
hCG MoM: 1.44
hCG Value: 41.7 [IU]/mL
uE3 MoM: 1.7

## 2023-10-13 LAB — URINE CULTURE

## 2023-10-14 ENCOUNTER — Other Ambulatory Visit: Payer: Self-pay | Admitting: Advanced Practice Midwife

## 2023-10-14 ENCOUNTER — Encounter: Payer: Self-pay | Admitting: Advanced Practice Midwife

## 2023-10-14 DIAGNOSIS — O2342 Unspecified infection of urinary tract in pregnancy, second trimester: Secondary | ICD-10-CM

## 2023-10-14 DIAGNOSIS — R8271 Bacteriuria: Secondary | ICD-10-CM | POA: Insufficient documentation

## 2023-10-14 MED ORDER — NITROFURANTOIN MONOHYD MACRO 100 MG PO CAPS
100.0000 mg | ORAL_CAPSULE | Freq: Two times a day (BID) | ORAL | 0 refills | Status: DC
Start: 2023-10-14 — End: 2023-12-04

## 2023-11-06 ENCOUNTER — Encounter: Payer: Self-pay | Admitting: Women's Health

## 2023-11-06 ENCOUNTER — Ambulatory Visit: Payer: Medicaid Other

## 2023-11-06 ENCOUNTER — Ambulatory Visit (INDEPENDENT_AMBULATORY_CARE_PROVIDER_SITE_OTHER): Payer: Medicaid Other | Admitting: Women's Health

## 2023-11-06 VITALS — BP 93/62 | HR 62 | Wt 151.0 lb

## 2023-11-06 DIAGNOSIS — Z3A20 20 weeks gestation of pregnancy: Secondary | ICD-10-CM

## 2023-11-06 DIAGNOSIS — Z3482 Encounter for supervision of other normal pregnancy, second trimester: Secondary | ICD-10-CM | POA: Diagnosis not present

## 2023-11-06 DIAGNOSIS — R8271 Bacteriuria: Secondary | ICD-10-CM | POA: Diagnosis not present

## 2023-11-06 DIAGNOSIS — Z3480 Encounter for supervision of other normal pregnancy, unspecified trimester: Secondary | ICD-10-CM

## 2023-11-06 DIAGNOSIS — O99891 Other specified diseases and conditions complicating pregnancy: Secondary | ICD-10-CM | POA: Diagnosis not present

## 2023-11-06 DIAGNOSIS — O285 Abnormal chromosomal and genetic finding on antenatal screening of mother: Secondary | ICD-10-CM

## 2023-11-06 DIAGNOSIS — Z363 Encounter for antenatal screening for malformations: Secondary | ICD-10-CM | POA: Diagnosis not present

## 2023-11-06 DIAGNOSIS — O09899 Supervision of other high risk pregnancies, unspecified trimester: Secondary | ICD-10-CM

## 2023-11-06 NOTE — Progress Notes (Signed)
[place] 20+6 wks,breech,posterior placenta gr 0,CX 4 cm,SVP of fluid 5.7 cm,FHR 154 bpm,EFW 414 g 69%,anatomy complete,no obvious abnormalities

## 2023-11-06 NOTE — Progress Notes (Signed)
LOW-RISK PREGNANCY VISIT Patient name: Catherine Nixon MRN [id number]  Date of birth: 2001-10-07 Chief Complaint:   Routine Prenatal Visit and Pregnancy Ultrasound (Urine culture)  History of Present Illness:   Catherine Nixon is a 23 y.o. G30P2002 female at [redacted]w[redacted]d with an Estimated Date of Delivery: 03/19/24 being seen today for ongoing management of a low-risk pregnancy.   Today she reports no complaints. Contractions: Irritability.  .  Movement: Present. denies leaking of fluid.     09/11/2023   11:12 AM 02/21/2022   10:24 AM 07/28/2021    9:39 AM 05/17/2021    3:31 PM 05/10/2021   11:26 AM  Depression screen PHQ 2/9  Decreased Interest 2 1 0 0 1  Down, Depressed, Hopeless 0 0 0 0 3  PHQ - 2 Score 2 1 0 0 4  Altered sleeping 2 1 2  0 2  Tired, decreased energy 3 2 2 2  0  Change in appetite 3 2 0 0 0  Feeling bad or failure about yourself  0 0 0 0 2  Trouble concentrating 2 1 2  0 0  Moving slowly or fidgety/restless 2 1 0 0 0  Suicidal thoughts 0 0 0 0 0  PHQ-9 Score 14 8 6 2 8   Difficult doing work/chores  Somewhat difficult Not difficult at all Not difficult at all Somewhat difficult        09/11/2023   11:13 AM 02/21/2022   10:25 AM 07/28/2021    9:40 AM 05/17/2021    3:32 PM  GAD 7 : Generalized Anxiety Score  Nervous, Anxious, on Edge 1 1 0 1  Control/stop worrying 0 0 0 0  Worry too much - different things 0 1 0 1  Trouble relaxing 2 1 1  0  Restless 2 1 0 1  Easily annoyed or irritable 3 1 2 1   Afraid - awful might happen 0 0 0 0  Total GAD 7 Score 8 5 3 4   Anxiety Difficulty  Somewhat difficult Not difficult at all Not difficult at all      Review of Systems:   Pertinent items are noted in HPI Denies abnormal vaginal discharge w/ itching/odor/irritation, headaches, visual changes, shortness of breath, chest pain, abdominal pain, severe nausea/vomiting, or problems with urination or bowel movements unless otherwise stated above. Pertinent History Reviewed:   Reviewed past medical,surgical, social, obstetrical and family history.  Reviewed problem list, medications and allergies. Physical Assessment:   Vitals:   11/06/23 0943  BP: 93/62  Pulse: 62  Weight: 151 lb (68.5 kg)  Body mass index is 29.49 kg/m.        Physical Examination:   General appearance: Well appearing, and in no distress  Mental status: Alert, oriented to person, place, and time  Skin: Warm & dry  Cardiovascular: Normal heart rate noted  Respiratory: Normal respiratory effort, no distress  Abdomen: Soft, gravid, nontender  Pelvic: Cervical exam deferred         Extremities: Edema: None  Fetal Status:     Movement: Present  [place] 20+6 wks,breech,posterior placenta gr 0,CX 4 cm,SVP of fluid 5.7 cm,FHR 154 bpm,EFW 414 g 69%,anatomy complete,no obvious abnormalities   Chaperone: N/A   No results found for this or any previous visit (from the past 24 hours).  Assessment & Plan:  1) Low-risk pregnancy G3P2002 at [redacted]w[redacted]d with an Estimated Date of Delivery: 03/19/24   2) Recent ASB> s/p macrobid, urine cx poc today   Meds: No orders of the defined types  were placed in this encounter.  Labs/procedures today: U/S and declined flu shot  Plan:  Continue routine obstetrical care  Next visit: prefers in person    Reviewed: Preterm labor symptoms and general obstetric precautions including but not limited to vaginal bleeding, contractions, leaking of fluid and fetal movement were reviewed in detail with the patient.  All questions were answered. Does have home bp cuff. Office bp cuff given: not applicable. Check bp weekly, let us know if consistently >140 and/or >90.  Follow-up: Return in about 4 weeks (around 12/04/2023) for LROB, CNM, in person.  Future Appointments  Date Time Provider Department Center  12/04/2023  9:10 AM Cheral Marker, CNM [facility]    Orders Placed This Encounter  Procedures   Urine Culture   Cheral Marker CNM, [facility] 11/06/2023 10:11  AM

## 2023-11-06 NOTE — Patient Instructions (Signed)
Ellean, thank you for choosing our office today! We appreciate the opportunity to meet your healthcare needs. You may receive a short survey by mail, e-mail, or through [employer]. If you are happy with your care we would appreciate if you could take just a few minutes to complete the survey questions. We read all of your comments and take your feedback very seriously. Thank you again for choosing our office.  Center for [employer] Team at [facility] at [facility] ([address] [place], [place] [address]) Entrance C, located off of [address] Free 24/7 valet parking  Go to [email or web address] to register for FREE online childbirth classes  Call the office [phone]) or go to [facility] if: You begin to severe cramping Your water breaks.  Sometimes it is a big gush of fluid, sometimes it is just a trickle that keeps getting your panties wet or running down your legs You have vaginal bleeding.  It is normal to have a small amount of spotting if your cervix was checked.   [facility] Pediatricians/Family Doctors [facility]): [address] Colette Ribas, [phone]           [facility]: [address], [phone]                [facility]): [address], [phone] (call to ask if accepting patients) [facility] Department: [address], [place], [phone]    [facility] Pediatricians/Family Doctors [facility]): [phone] [address], [phone] [facility]: [address] [place], [phone] [facility]: [address], [phone]  [facility] Doctors  [facility]): [phone] [facility]: [address] [phone]   [facility] Doctors [facility]: [address] [phone]  [facility] Doctors  [facility]: [phone], [phone]  Home Blood Pressure Monitoring for Patients   Your provider has recommended that you check your blood pressure (BP) at least once a week at home. If you do not have a blood pressure cuff at home, one will be provided for you. Contact your provider if you have not received your monitor within 1 week.   Helpful Tips for Accurate Home Blood Pressure Checks  Don't smoke, exercise, or drink caffeine 30 minutes before checking your BP Use the restroom before checking your BP (a full bladder can raise your pressure) Relax in a comfortable upright chair Feet on the ground Left arm resting comfortably on a flat surface at the level of your heart Legs uncrossed Back supported Sit quietly and don't talk Place the cuff on your bare arm Adjust snuggly, so that only two fingertips can fit between your skin and the top of the cuff Check 2 readings separated by at least one minute Keep a log of your BP readings For a visual, please reference this diagram: http://ccnc.care/bpdiagram  Provider Name: [facility]     Phone: [phone]  Zone 1: ALL CLEAR  Continue to monitor your symptoms:  BP reading is less than 140 (top number) or less than 90 (bottom number)  No right upper stomach pain No headaches or seeing spots No feeling nauseated or throwing up No swelling in face and hands  Zone 2: CAUTION Call your doctor's office for any of the following:  BP reading is greater than 140 (top number) or greater than  90 (bottom number)  Stomach pain under your ribs in the middle or right side Headaches or seeing spots Feeling nauseated or throwing up Swelling in face and hands  Zone 3: EMERGENCY  Seek immediate medical care if you have any of the following:  BP reading is greater than160 (top number) or greater than 110 (bottom number) Severe headaches not improving with Tylenol Serious difficulty catching your breath Any worsening symptoms from Zone 2      [place]orrow trimestre de [employer] Trimester of Pregnancy  El segundo trimestre de Psychiatrist va desde la semana 14 hasta la semana 27. Es Designer, jewellery desde el mes 2 hasta el mes 4 de [place]. Durante el segundo trimestre: Las nuseas del embarazo han disminuido o han desaparecido. Usted puede tener ms energa. Usted puede tener hambre con ms frecuencia. En esta poca, el beb en gestacin crece muy rpidamente. Hacia el final del sexto mes, el beb en gestacin puede medir aproximadamente 12 pulgadas y pesar alrededor de 1 libras. Es probable que comience a Engineer, structural beb se [employer] las 16 y las 20 [address] de Psychiatrist. Cambios en el cuerpo durante el segundo trimestre Su cuerpo contina cambiando durante este perodo. En general, los cambios desaparecen despus del nacimiento del beb. Cambios fsicos Aumentar ms peso. Se le agrandar la barriga. Podrn aparecer las primeras Albertson's caderas, la barriga y las [place]. Las [employer] seguirn creciendo y pueden dolerle. Pueden aparecerle zonas oscuras o manchas en el rostro. Es posible que se forme una lnea oscura desde el ombligo hasta la zona del pubis. Esta lnea se denomina "linea nigra". El cabello puede crecerle ms rpido y engrosarse. Cambios en la salud Es posible que tenga dolores de [place]. Es posible que tenga Merchant navy officer. Es posible que orine con mayor frecuencia. Es posible que tenga venas abultadas e hinchadas (venas varicosas). Puede tener dificultad para defecar (estreimiento) o venas hinchadas en el ano que pueden picar o doler (hemorroides). Puede sentir dolor en la espalda. Esto se debe a: Aumento de peso. Las hormonas del Management consultant las articulaciones en la pelvis. Siga estas instrucciones en su casa: Medicamentos Dgale al mdico si est usando medicamentos. Pregntele si esos medicamentos pueden usarse sin riesgo Academic librarian. El mdico puede cambiar los medicamentos que [place]. No  use ningn medicamento a menos que se lo haya indicado el mdico. Tome vitaminas prenatales que tengan por lo menos 600 microgramos (mcg) de cido flico. No consuma medicamentos a base de hierbas, drogas ilegales, ni medicamentos que el mdico no haya autorizado. Comida y bebida Durante el [place], [place] cuerpo necesita alimento adicional para el beb en gestacin. Hable con su mdico sobre qu debe comer durante el [place]. Actividad La mayora de las mujeres pueden hacer ejercicio durante el [place]. Es posible que los ejercicios deban modificarse a medida que el embarazo avanza. Hable con su mdico sobre sus actividades y rutinas de ejercicio. Alivio del dolor y del Dentist Use un sostn que le brinde buen soporte si le duelen las [place]. Descanse con las piernas elevadas si tiene calambres o dolor de cintura. Tome baos de asiento tibios para Engineer, materials de las hemorroides. Use una crema para las hemorroides si el mdico se lo permite. No se haga duchas vaginales. No use tampones ni protectores de ropa interior perfumados. No se d baos de inmersin en agua caliente, baos turcos ni saunas. Seguridad Use el cinturn de seguridad en todo momento mientras vaya en auto. Hable con su  mdico si alguien la golpea, la lastima o le grita. Hable con el mdico si se siente triste o tiene pensamientos acerca de Candy Kitchen dao a usted misma. Estilo de vida Ciertas cosas pueden ser perjudiciales durante el embarazo. Es Manufacturing engineer lo siguiente: No beba alcohol,no fume, no vapee ni consuma productos que tengan nicotina o tabaco. Si necesita ayuda para dejar de fumar, hable con su mdico. Evite el contacto con las bandejas sanitarias de los gatos y la tierra que estos animales usan. Estos objetos tienen grmenes que puedenperjudicar el embarazo y el beb. Instrucciones generales Concurra a todas las visitas de seguimiento. Ayuda a que usted y su beb en gestacin se mantengan tan saludables como  sea posible. Escriba sus preguntas. Llvelas cuando concurra a las visitas prenatales. Su mdico: Hablar con usted sobre su salud general. Conley Rolls dar consejos o la derivar a especialistas que puedan ayudarla con diferentes necesidades, entre ellas: Clases de educacin prenatal. Salud mental y psicoterapia. Alimentos y alimentacin saludable. Si necesita ayuda con alimentos, pdala. Dnde buscar ms informacin American Pregnancy Association (Asociacin Americana del Embarazo): americanpregnancy.org [employer] of Obstetricians and Gynecologists (Colegio Estadounidense de Obstetras y Scientific laboratory technician): acog.org Office on [employer] (Oficina para la Salud de la Mujer): TravelLesson.ca Comunquese con un mdico si: Tiene un dolor de cabeza que no desaparece despus de Science writer. Tiene alguno de estos problemas: No puede comer ni beber. Vomita o siente ganas de vomitar. Hace deposiciones acuosas (diarrea) durante 2 o ms das. Siente dolor al orinar o hace orina con mal olor. Se ha sentido enferma durante 2 o ms das y no mejora. Comunquese con su mdico de inmediato si: Alguna de estas sustancias emana de la vagina: Secrecin anormal. Lquido con mal olor. Sangrado. El beb se mueve menos de lo habitual. Tiene contracciones, clicos en la barriga o siente dolor en la pelvis o la parte baja de la espalda. Tiene sntomas de presin arterial alta o preeclampsia. Esto incluye lo siguiente: Dolor de [place] intenso y punzante que no desaparece. Hinchazn sbita o extrema del rostro, las manos, las piernas o los pies. Problemas de visin: Ve manchas. Tiene la visin borrosa. Los ojos tienen sensibilidad a Statistician. Si no puede comunicarse con el mdico, acuda a un servicio o una sala de [place]. Solicite ayuda de inmediato si: Se desmaya, se siente confundida o no puede pensar con claridad. Tiene dolor en el pecho o dificultad para respirar. Sufre cualquier tipo de lesin, por  ejemplo, debido a una cada o un accidente automovilstico. Estos sntomas pueden indicar una emergencia. Llame al 911 de inmediato. No espere a ver si los sntomas desaparecen. No conduzca por sus propios medios [employer]. Esta informacin no tiene Theme park manager el consejo del mdico. Asegrese de hacerle al mdico cualquier pregunta que tenga. Document Revised: 03/01/2023 Document Reviewed: 03/01/2023 Elsevier Patient Education  2024 [employer].

## 2023-11-09 LAB — URINE CULTURE

## 2023-11-11 ENCOUNTER — Encounter: Payer: Self-pay | Admitting: Women's Health

## 2023-12-04 ENCOUNTER — Ambulatory Visit: Payer: Medicaid Other | Admitting: Women's Health

## 2023-12-04 ENCOUNTER — Encounter: Payer: Self-pay | Admitting: Women's Health

## 2023-12-04 VITALS — BP 102/66 | HR 73 | Wt 153.8 lb

## 2023-12-04 DIAGNOSIS — Z3A24 24 weeks gestation of pregnancy: Secondary | ICD-10-CM

## 2023-12-04 DIAGNOSIS — Z3482 Encounter for supervision of other normal pregnancy, second trimester: Secondary | ICD-10-CM

## 2023-12-04 NOTE — Progress Notes (Signed)
 LOW-RISK PREGNANCY VISIT Patient name: Khamil Lamica MRN [id number]  Date of birth: 12/31/2000 Chief Complaint:   Routine Prenatal Visit  History of Present Illness:   Catherine Nixon is a 23 y.o. G33P2002 female at [redacted]w[redacted]d with an Estimated Date of Delivery: 03/19/24 being seen today for ongoing management of a low-risk pregnancy.   Today she reports no complaints. Contractions: Not present. Vag. Bleeding: None.  Movement: Present. denies leaking of fluid.     09/11/2023   11:12 AM 02/21/2022   10:24 AM 07/28/2021    9:39 AM 05/17/2021    3:31 PM 05/10/2021   11:26 AM  Depression screen PHQ 2/9  Decreased Interest 2 1 0 0 1  Down, Depressed, Hopeless 0 0 0 0 3  PHQ - 2 Score 2 1 0 0 4  Altered sleeping 2 1 2  0 2  Tired, decreased energy 3 2 2 2  0  Change in appetite 3 2 0 0 0  Feeling bad or failure about yourself  0 0 0 0 2  Trouble concentrating 2 1 2  0 0  Moving slowly or fidgety/restless 2 1 0 0 0  Suicidal thoughts 0 0 0 0 0  PHQ-9 Score 14 8 6 2 8   Difficult doing work/chores  Somewhat difficult Not difficult at all Not difficult at all Somewhat difficult        09/11/2023   11:13 AM 02/21/2022   10:25 AM 07/28/2021    9:40 AM 05/17/2021    3:32 PM  GAD 7 : Generalized Anxiety Score  Nervous, Anxious, on Edge 1 1 0 1  Control/stop worrying 0 0 0 0  Worry too much - different things 0 1 0 1  Trouble relaxing 2 1 1  0  Restless 2 1 0 1  Easily annoyed or irritable 3 1 2 1   Afraid - awful might happen 0 0 0 0  Total GAD 7 Score 8 5 3 4   Anxiety Difficulty  Somewhat difficult Not difficult at all Not difficult at all      Review of Systems:   Pertinent items are noted in HPI Denies abnormal vaginal discharge w/ itching/odor/irritation, headaches, visual changes, shortness of breath, chest pain, abdominal pain, severe nausea/vomiting, or problems with urination or bowel movements unless otherwise stated above. Pertinent History Reviewed:  Reviewed past  medical,surgical, social, obstetrical and family history.  Reviewed problem list, medications and allergies. Physical Assessment:   Vitals:   12/04/23 0931  BP: 102/66  Pulse: 73  Weight: 153 lb 12.8 oz (69.8 kg)  Body mass index is 30.04 kg/m.        Physical Examination:   General appearance: Well appearing, and in no distress  Mental status: Alert, oriented to person, place, and time  Skin: Warm & dry  Cardiovascular: Normal heart rate noted  Respiratory: Normal respiratory effort, no distress  Abdomen: Soft, gravid, nontender  Pelvic: Cervical exam deferred         Extremities: Edema: None  Fetal Status: Fetal Heart Rate (bpm): 136 Fundal Height: 26 cm Movement: Present    Chaperone: N/A   No results found for this or any previous visit (from the past 24 hours).  Assessment & Plan:  1) Low-risk pregnancy G3P2002 at [redacted]w[redacted]d with an Estimated Date of Delivery: 03/19/24     Meds: No orders of the defined types were placed in this encounter.  Labs/procedures today: none  Plan:  Continue routine obstetrical care  Next visit: prefers will be in person for  pn2     Reviewed: Preterm labor symptoms and general obstetric precautions including but not limited to vaginal bleeding, contractions, leaking of fluid and fetal movement were reviewed in detail with the patient.  All questions were answered. Does have home bp cuff. Office bp cuff given: not applicable. Check bp weekly, let us know if consistently >140 and/or >90.  Follow-up: Return in about 3 weeks (around 12/25/2023) for LROB, PN2, CNM, in person.  No future appointments.  No orders of the defined types were placed in this encounter.  Cheral Marker CNM, [facility] 12/04/2023 9:37 AM

## 2023-12-04 NOTE — Patient Instructions (Signed)
 Catherine Nixon, thank you for choosing our office today! We appreciate the opportunity to meet your healthcare needs. You may receive a short survey by mail, e-mail, or through [employer]. If you are happy with your care we would appreciate if you could take just a few minutes to complete the survey questions. We read all of your comments and take your feedback very seriously. Thank you again for choosing our office.  Center for [employer] Team at [facility]  [facility] at [facility] ([address] [place], [place] [address]) Entrance C, located off of [address] Free 24/7 valet parking   You will have your sugar test next visit.  Please do not eat or drink anything after midnight the night before you come, not even water.  You will be here for at least two hours.  Please make an appointment online for the bloodwork at [email or web address] for 8:00am (or as close to this as possible). Make sure you select the [facility] service center.   CLASSES: Go to [email or web address] to register for classes (childbirth, breastfeeding, waterbirth, infant CPR, daddy bootcamp, etc.)  Call the office [phone]) or go to [facility] if: You begin to have strong, frequent contractions Your water breaks.  Sometimes it is a big gush of fluid, sometimes it is just a trickle that keeps getting your panties wet or running down your legs You have vaginal bleeding.  It is normal to have a small amount of spotting if your cervix was checked.  You don't feel your baby moving like normal.  If you don't, get you something to eat and drink and lay down and focus on feeling your baby move.   If your baby is still not moving like normal, you should call the office or go to [facility].  Call the office [phone]) or go to [facility] hospital for these signs of pre-eclampsia: Severe headache that does not go away with Tylenol Visual changes- seeing spots, double, blurred vision Pain under your right breast or upper  abdomen that does not go away with Tums or heartburn medicine Nausea and/or vomiting Severe swelling in your hands, feet, and face    [place] Pediatricians/Family Doctors [facility]): [address] Colette Ribas, [phone]           [facility]: [address], [phone]                [facility]): [address], [phone]  [facility] Department: [address], [place], [phone]    [facility] Pediatricians/Family Doctors [facility]): [address], [phone] [facility]: [address] [place], [phone] [facility]: [address], [phone]  [facility] Doctors  [facility]): [phone] [facility]: [address], [phone]   [facility] Doctors [facility]: [address] [phone]  [facility] Doctors  [facility]: [phone], [phone]  Home Blood Pressure Monitoring for Patients   Your provider has recommended that you check your blood pressure (BP) at least once a week at home. If you do not have a blood pressure cuff at home, one will be provided for you. Contact your provider if you have not received your monitor within 1 week.   Helpful Tips for Accurate Home Blood Pressure Checks  Don't smoke, exercise, or drink caffeine 30 minutes before checking  your BP Use the restroom before checking your BP (a full bladder can raise your pressure) Relax in a comfortable upright chair Feet on the ground Left arm resting comfortably on a flat surface at the level of your heart Legs uncrossed Back supported Sit quietly and don't talk Place the cuff on your bare arm Adjust snuggly, so that only two fingertips can fit between your skin and the top of the cuff Check 2 readings separated by at least one  minute Keep a log of your BP readings For a visual, please reference this diagram: http://ccnc.care/bpdiagram  Provider Name: [facility]     Phone: [phone]  Zone 1: ALL CLEAR  Continue to monitor your symptoms:  BP reading is less than 140 (top number) or less than 90 (bottom number)  No right upper stomach pain No headaches or seeing spots No feeling nauseated or throwing up No swelling in face and hands  Zone 2: CAUTION Call your doctor's office for any of the following:  BP reading is greater than 140 (top number) or greater than 90 (bottom number)  Stomach pain under your ribs in the middle or right side Headaches or seeing spots Feeling nauseated or throwing up Swelling in face and hands  Zone 3: EMERGENCY  Seek immediate medical care if you have any of the following:  BP reading is greater than160 (top number) or greater than 110 (bottom number) Severe headaches not improving with Tylenol Serious difficulty catching your breath Any worsening symptoms from Zone 2   Second Trimester of Pregnancy The second trimester is from week 13 through week 28, months 4 through 6. The second trimester is often a time when you feel your best. Your body has also adjusted to being pregnant, and you begin to feel better physically. Usually, morning sickness has lessened or quit completely, you may have more energy, and you may have an increase in appetite. The second trimester is also a time when the fetus is growing rapidly. At the end of the sixth month, the fetus is about 9 inches long and weighs about 1 pounds. You will likely begin to feel the baby move (quickening) between 18 and 20 weeks of the pregnancy. BODY CHANGES Your body goes through many changes during pregnancy. The changes vary from woman to woman.  Your weight will continue to increase. You will notice your lower abdomen bulging out. You may begin to get stretch marks on your hips, abdomen, and breasts. You may  develop headaches that can be relieved by medicines approved by your health care provider. You may urinate more often because the fetus is pressing on your bladder. You may develop or continue to have heartburn as a result of your pregnancy. You may develop constipation because certain hormones are causing the muscles that push waste through your intestines to slow down. You may develop hemorrhoids or swollen, bulging veins (varicose veins). You may have back pain because of the weight gain and pregnancy hormones relaxing your joints between the bones in your pelvis and as a result of a shift in weight and the muscles that support your balance. Your breasts will continue to grow and be tender. Your gums may bleed and may be sensitive to brushing and flossing. Dark spots or blotches (chloasma, mask of pregnancy) may develop on your face. This will likely fade after the baby is born. A dark line from your belly button to the pubic area (linea nigra) may appear. This will likely fade after the  baby is born. You may have changes in your hair. These can include thickening of your hair, rapid growth, and changes in texture. Some women also have hair loss during or after pregnancy, or hair that feels dry or thin. Your hair will most likely return to normal after your baby is born. WHAT TO EXPECT AT YOUR PRENATAL VISITS During a routine prenatal visit: You will be weighed to make sure you and the fetus are growing normally. Your blood pressure will be taken. Your abdomen will be measured to track your baby's growth. The fetal heartbeat will be listened to. Any test results from the previous visit will be discussed. Your health care provider may ask you: How you are feeling. If you are feeling the baby move. If you have had any abnormal symptoms, such as leaking fluid, bleeding, severe headaches, or abdominal cramping. If you have any questions. Other tests that may be performed during your second  trimester include: Blood tests that check for: Low iron levels (anemia). Gestational diabetes (between 24 and 28 weeks). Rh antibodies. Urine tests to check for infections, diabetes, or protein in the urine. An ultrasound to confirm the proper growth and development of the baby. An amniocentesis to check for possible genetic problems. Fetal screens for spina bifida and Down syndrome. HOME CARE INSTRUCTIONS  Avoid all smoking, herbs, alcohol, and unprescribed drugs. These chemicals affect the formation and growth of the baby. Follow your health care provider's instructions regarding medicine use. There are medicines that are either safe or unsafe to take during pregnancy. Exercise only as directed by your health care provider. Experiencing uterine cramps is a good sign to stop exercising. Continue to eat regular, healthy meals. Wear a good support bra for breast tenderness. Do not use hot tubs, steam rooms, or saunas. Wear your seat belt at all times when driving. Avoid raw meat, uncooked cheese, cat litter boxes, and soil used by cats. These carry germs that can cause birth defects in the baby. Take your prenatal vitamins. Try taking a stool softener (if your health care provider approves) if you develop constipation. Eat more high-fiber foods, such as fresh vegetables or fruit and whole grains. Drink plenty of fluids to keep your urine clear or pale yellow. Take warm sitz baths to soothe any pain or discomfort caused by hemorrhoids. Use hemorrhoid cream if your health care provider approves. If you develop varicose veins, wear support hose. Elevate your feet for 15 minutes, 3-4 times a day. Limit salt in your diet. Avoid heavy lifting, wear low heel shoes, and practice good posture. Rest with your legs elevated if you have leg cramps or low back pain. Visit your dentist if you have not gone yet during your pregnancy. Use a soft toothbrush to brush your teeth and be gentle when you floss. A  sexual relationship may be continued unless your health care provider directs you otherwise. Continue to go to all your prenatal visits as directed by your health care provider. SEEK MEDICAL CARE IF:  You have dizziness. You have mild pelvic cramps, pelvic pressure, or nagging pain in the abdominal area. You have persistent nausea, vomiting, or diarrhea. You have a bad smelling vaginal discharge. You have pain with urination. SEEK IMMEDIATE MEDICAL CARE IF:  You have a fever. You are leaking fluid from your vagina. You have spotting or bleeding from your vagina. You have severe abdominal cramping or pain. You have rapid weight gain or loss. You have shortness of breath with chest pain. You  notice sudden or extreme swelling of your face, hands, ankles, feet, or legs. You have not felt your baby move in over an hour. You have severe headaches that do not go away with medicine. You have vision changes. Document Released: 09/18/2001 Document Revised: 09/29/2013 Document Reviewed: 11/25/2012 [employer] Patient Information 2015 [place], [place]. This information is not intended to replace advice given to you by your health care provider. Make sure you discuss any questions you have with your health care provider.

## 2023-12-25 ENCOUNTER — Other Ambulatory Visit: Payer: Medicaid Other

## 2023-12-25 ENCOUNTER — Ambulatory Visit: Payer: Medicaid Other | Admitting: Advanced Practice Midwife

## 2023-12-25 VITALS — BP 110/69 | HR 97 | Wt 158.0 lb

## 2023-12-25 DIAGNOSIS — Z3480 Encounter for supervision of other normal pregnancy, unspecified trimester: Secondary | ICD-10-CM

## 2023-12-25 DIAGNOSIS — Z3482 Encounter for supervision of other normal pregnancy, second trimester: Secondary | ICD-10-CM

## 2023-12-25 DIAGNOSIS — Z3A27 27 weeks gestation of pregnancy: Secondary | ICD-10-CM

## 2023-12-25 DIAGNOSIS — Z131 Encounter for screening for diabetes mellitus: Secondary | ICD-10-CM | POA: Diagnosis not present

## 2023-12-25 DIAGNOSIS — Z302 Encounter for sterilization: Secondary | ICD-10-CM | POA: Diagnosis not present

## 2023-12-25 NOTE — Patient Instructions (Signed)
 Catherine Nixon, thank you for choosing our office today! We appreciate the opportunity to meet your healthcare needs. You may receive a short survey by mail, e-mail, or through [employer]. If you are happy with your care we would appreciate if you could take just a few minutes to complete the survey questions. We read all of your comments and take your feedback very seriously. Thank you again for choosing our office.  Center for [employer] Team at [facility]  [facility] at [facility] ([address] [place], [place] [address]) Entrance C, located off of [address] Free 24/7 valet parking   CLASSES: Go to [email or web address] to register for classes (childbirth, breastfeeding, waterbirth, infant CPR, daddy bootcamp, etc.)  Call the office [phone]) or go to [facility] if: You begin to have strong, frequent contractions Your water breaks.  Sometimes it is a big gush of fluid, sometimes it is just a trickle that keeps getting your panties wet or running down your legs You have vaginal bleeding.  It is normal to have a small amount of spotting if your cervix was checked.  You don't feel your baby moving like normal.  If you don't, get you something to eat and drink and lay down and focus on feeling your baby move.   If your baby is still not moving like normal, you should call the office or go to [facility] ([facility]).  Call the office [phone]) or go to [facility] hospital for these signs of pre-eclampsia: Severe headache that does not go away with Tylenol Visual changes- seeing spots, double, blurred vision Pain under your right breast or upper abdomen that does not go away with Tums or heartburn medicine Nausea and/or vomiting Severe swelling in your hands, feet, and face   Tdap Vaccine It is recommended that you get the Tdap vaccine during the third trimester of EACH pregnancy to help protect your baby from getting pertussis (whooping cough) 27-36 weeks is the BEST time to do this  so that you can pass the protection on to your baby. During pregnancy is better than after pregnancy, but if you are unable to get it during pregnancy it will be offered at the hospital.  You can get this vaccine with [place], at the health department, your family doctor, or some local pharmacies Everyone who will be around your baby should also be up-to-date on their vaccines before the baby comes. Adults (who are not pregnant) only need 1 dose of Tdap during adulthood.   [facility] Pediatricians/Family Doctors [facility]): [address] [place], [phone]           [facility]: [address], [phone]                [facility]): [address], [phone] (call to ask if accepting patients) [facility] Department: [address], [place], [phone]    [facility] Pediatricians/Family Doctors [facility]): [phone] [address], [phone] [facility]: [address] [place], [phone] [facility]: [address], [phone]  [facility] Doctors  [facility]): [phone] [facility]: [address] [phone]   [facility] Doctors [facility]: [address] [phone]  [facility] Family Doctors  [facility]: [phone], [phone]  Home Blood Pressure Monitoring for Patients   Your provider has recommended that you check your  blood pressure (BP) at least once a week at home. If you do not have a blood pressure cuff at home, one will be provided for you. Contact your provider if you have not received your monitor within 1 week.   Helpful Tips for Accurate Home Blood Pressure Checks  Don't smoke, exercise, or drink caffeine 30 minutes before checking your BP Use the restroom before checking your BP (a full bladder can raise your  pressure) Relax in a comfortable upright chair Feet on the ground Left arm resting comfortably on a flat surface at the level of your heart Legs uncrossed Back supported Sit quietly and don't talk Place the cuff on your bare arm Adjust snuggly, so that only two fingertips can fit between your skin and the top of the cuff Check 2 readings separated by at least one minute Keep a log of your BP readings For a visual, please reference this diagram: http://ccnc.care/bpdiagram  Provider Name: [facility]     Phone: [phone]  Zone 1: ALL CLEAR  Continue to monitor your symptoms:  BP reading is less than 140 (top number) or less than 90 (bottom number)  No right upper stomach pain No headaches or seeing spots No feeling nauseated or throwing up No swelling in face and hands  Zone 2: CAUTION Call your doctor's office for any of the following:  BP reading is greater than 140 (top number) or greater than 90 (bottom number)  Stomach pain under your ribs in the middle or right side Headaches or seeing spots Feeling nauseated or throwing up Swelling in face and hands  Zone 3: EMERGENCY  Seek immediate medical care if you have any of the following:  BP reading is greater than160 (top number) or greater than 110 (bottom number) Severe headaches not improving with Tylenol Serious difficulty catching your breath Any worsening symptoms from Zone 2   Third Trimester of Pregnancy The third trimester is from week 29 through week 42, months 7 through 9. The third trimester is a time when the fetus is growing rapidly. At the end of the ninth month, the fetus is about 20 inches in length and weighs 6-10 pounds.  BODY CHANGES Your body goes through many changes during pregnancy. The changes vary from woman to woman.  Your weight will continue to increase. You can expect to gain 25-35 pounds (11-16 kg) by the end of the pregnancy. You may begin to get stretch marks on your hips, abdomen,  and breasts. You may urinate more often because the fetus is moving lower into your pelvis and pressing on your bladder. You may develop or continue to have heartburn as a result of your pregnancy. You may develop constipation because certain hormones are causing the muscles that push waste through your intestines to slow down. You may develop hemorrhoids or swollen, bulging veins (varicose veins). You may have pelvic pain because of the weight gain and pregnancy hormones relaxing your joints between the bones in your pelvis. Backaches may result from overexertion of the muscles supporting your posture. You may have changes in your hair. These can include thickening of your hair, rapid growth, and changes in texture. Some women also have hair loss during or after pregnancy, or hair that feels dry or thin. Your hair will most likely return to normal after your baby is born. Your breasts will continue to grow and be tender. A yellow discharge may leak from your breasts called colostrum. Your belly button may stick out. You may  feel short of breath because of your expanding uterus. You may notice the fetus "dropping," or moving lower in your abdomen. You may have a bloody mucus discharge. This usually occurs a few days to a week before labor begins. Your cervix becomes thin and soft (effaced) near your due date. WHAT TO EXPECT AT YOUR PRENATAL EXAMS  You will have prenatal exams every 2 weeks until week 36. Then, you will have weekly prenatal exams. During a routine prenatal visit: You will be weighed to make sure you and the fetus are growing normally. Your blood pressure is taken. Your abdomen will be measured to track your baby's growth. The fetal heartbeat will be listened to. Any test results from the previous visit will be discussed. You may have a cervical check near your due date to see if you have effaced. At around 36 weeks, your caregiver will check your cervix. At the same time, your  caregiver will also perform a test on the secretions of the vaginal tissue. This test is to determine if a type of bacteria, Group B streptococcus, is present. Your caregiver will explain this further. Your caregiver may ask you: What your birth plan is. How you are feeling. If you are feeling the baby move. If you have had any abnormal symptoms, such as leaking fluid, bleeding, severe headaches, or abdominal cramping. If you have any questions. Other tests or screenings that may be performed during your third trimester include: Blood tests that check for low iron levels (anemia). Fetal testing to check the health, activity level, and growth of the fetus. Testing is done if you have certain medical conditions or if there are problems during the pregnancy. FALSE LABOR You may feel small, irregular contractions that eventually go away. These are called Braxton Hicks contractions, or false labor. Contractions may last for hours, days, or even weeks before true labor sets in. If contractions come at regular intervals, intensify, or become painful, it is best to be seen by your caregiver.  SIGNS OF LABOR  Menstrual-like cramps. Contractions that are 5 minutes apart or less. Contractions that start on the top of the uterus and spread down to the lower abdomen and back. A sense of increased pelvic pressure or back pain. A watery or bloody mucus discharge that comes from the vagina. If you have any of these signs before the 37th week of pregnancy, call your caregiver right away. You need to go to the hospital to get checked immediately. HOME CARE INSTRUCTIONS  Avoid all smoking, herbs, alcohol, and unprescribed drugs. These chemicals affect the formation and growth of the baby. Follow your caregiver's instructions regarding medicine use. There are medicines that are either safe or unsafe to take during pregnancy. Exercise only as directed by your caregiver. Experiencing uterine cramps is a good sign to  stop exercising. Continue to eat regular, healthy meals. Wear a good support bra for breast tenderness. Do not use hot tubs, steam rooms, or saunas. Wear your seat belt at all times when driving. Avoid raw meat, uncooked cheese, cat litter boxes, and soil used by cats. These carry germs that can cause birth defects in the baby. Take your prenatal vitamins. Try taking a stool softener (if your caregiver approves) if you develop constipation. Eat more high-fiber foods, such as fresh vegetables or fruit and whole grains. Drink plenty of fluids to keep your urine clear or pale yellow. Take warm sitz baths to soothe any pain or discomfort caused by hemorrhoids. Use hemorrhoid cream if  your caregiver approves. If you develop varicose veins, wear support hose. Elevate your feet for 15 minutes, 3-4 times a day. Limit salt in your diet. Avoid heavy lifting, wear low heal shoes, and practice good posture. Rest a lot with your legs elevated if you have leg cramps or low back pain. Visit your dentist if you have not gone during your pregnancy. Use a soft toothbrush to brush your teeth and be gentle when you floss. A sexual relationship may be continued unless your caregiver directs you otherwise. Do not travel far distances unless it is absolutely necessary and only with the approval of your caregiver. Take prenatal classes to understand, practice, and ask questions about the labor and delivery. Make a trial run to the hospital. Pack your hospital bag. Prepare the baby's nursery. Continue to go to all your prenatal visits as directed by your caregiver. SEEK MEDICAL CARE IF: You are unsure if you are in labor or if your water has broken. You have dizziness. You have mild pelvic cramps, pelvic pressure, or nagging pain in your abdominal area. You have persistent nausea, vomiting, or diarrhea. You have a bad smelling vaginal discharge. You have pain with urination. SEEK IMMEDIATE MEDICAL CARE IF:  You  have a fever. You are leaking fluid from your vagina. You have spotting or bleeding from your vagina. You have severe abdominal cramping or pain. You have rapid weight loss or gain. You have shortness of breath with chest pain. You notice sudden or extreme swelling of your face, hands, ankles, feet, or legs. You have not felt your baby move in over an hour. You have severe headaches that do not go away with medicine. You have vision changes. Document Released: 09/18/2001 Document Revised: 09/29/2013 Document Reviewed: 11/25/2012 [facility] Patient Information 2015 [place], [place]. This information is not intended to replace advice given to you by your health care provider. Make sure you discuss any questions you have with your health care provider.

## 2023-12-25 NOTE — Progress Notes (Signed)
   LOW-RISK PREGNANCY VISIT Patient name: Catherine Nixon MRN [id number]  Date of birth: [birth year] Chief Complaint:   Routine Prenatal Visit  History of Present Illness:   Catherine Nixon is a 23 y.o. G10P2002 female at [redacted]w[redacted]d with an Estimated Date of Delivery: 03/19/24 being seen today for ongoing management of a low-risk pregnancy.  Today she reports no complaints. Contractions: Not present. Vag. Bleeding: None.  Movement: Present. denies leaking of fluid. Review of Systems:   Pertinent items are noted in HPI Denies abnormal vaginal discharge w/ itching/odor/irritation, headaches, visual changes, shortness of breath, chest pain, abdominal pain, severe nausea/vomiting, or problems with urination or bowel movements unless otherwise stated above. Pertinent History Reviewed:  Reviewed past medical,surgical, social, obstetrical and family history.  Reviewed problem list, medications and allergies. Physical Assessment:   Vitals:   12/25/23 0941  BP: 110/69  Pulse: 97  Weight: 158 lb (71.7 kg)  Body mass index is 30.86 kg/m.        Physical Examination:   General appearance: Well appearing, and in no distress  Mental status: Alert, oriented to person, place, and time  Skin: Warm & dry  Cardiovascular: Normal heart rate noted  Respiratory: Normal respiratory effort, no distress  Abdomen: Soft, gravid, nontender  Pelvic: Cervical exam deferred         Extremities: Edema: None  Fetal Status: Fetal Heart Rate (bpm): 138 Fundal Height: 28 cm Movement: Present    No results found for this or any previous visit (from the past 24 hours).  Assessment & Plan:  1) Low-risk pregnancy G3P2002 at [redacted]w[redacted]d with an Estimated Date of Delivery: 03/19/24   2) Wants BTS, also considering vasectomy for husband in addition; reviewed risks/benefits, discussed high incidence regret <30yo if appropriate, LARCs just as effective, consent signed today    Meds: No orders of the defined types were placed in this  encounter.  Labs/procedures today: PN2  Plan:  Continue routine obstetrical care   Reviewed: Preterm labor symptoms and general obstetric precautions including but not limited to vaginal bleeding, contractions, leaking of fluid and fetal movement were reviewed in detail with the patient.  All questions were answered. Has home bp cuff. Check bp weekly, let us know if >140/90.   Follow-up: Return in about 3 weeks (around 01/15/2024) for LROB, in person, Sign BTL consent today.  No orders of the defined types were placed in this encounter.  Arabella Merles CNM 12/25/2023 10:03 AM

## 2023-12-25 NOTE — Progress Notes (Signed)
 Denies problems today.

## 2023-12-26 ENCOUNTER — Encounter: Payer: Self-pay | Admitting: Advanced Practice Midwife

## 2023-12-26 LAB — CBC
Hematocrit: 33.6 % — ABNORMAL LOW (ref 34.0–46.6)
Hemoglobin: 10.9 g/dL — ABNORMAL LOW (ref 11.1–15.9)
MCH: 28.3 pg (ref 26.6–33.0)
MCHC: 32.4 g/dL (ref 31.5–35.7)
MCV: 87 fL (ref 79–97)
Platelets: 292 10*3/uL (ref 150–450)
RBC: 3.85 x10E6/uL (ref 3.77–5.28)
RDW: 12.4 % (ref 11.7–15.4)
WBC: 8 10*3/uL (ref 3.4–10.8)

## 2023-12-26 LAB — HIV ANTIBODY (ROUTINE TESTING W REFLEX): HIV Screen 4th Generation wRfx: NONREACTIVE

## 2023-12-26 LAB — RPR: RPR Ser Ql: NONREACTIVE

## 2023-12-26 LAB — ANTIBODY SCREEN: Antibody Screen: NEGATIVE

## 2023-12-26 LAB — GLUCOSE TOLERANCE, 2 HOURS W/ 1HR
Glucose, 1 hour: 69 mg/dL — ABNORMAL LOW (ref 70–179)
Glucose, 2 hour: 76 mg/dL (ref 70–152)
Glucose, Fasting: 80 mg/dL (ref 70–91)

## 2024-01-15 ENCOUNTER — Ambulatory Visit: Admitting: Obstetrics and Gynecology

## 2024-01-15 ENCOUNTER — Encounter: Payer: Self-pay | Admitting: Obstetrics and Gynecology

## 2024-01-15 VITALS — BP 122/66 | HR 76 | Wt 164.0 lb

## 2024-01-15 DIAGNOSIS — Z3483 Encounter for supervision of other normal pregnancy, third trimester: Secondary | ICD-10-CM | POA: Diagnosis not present

## 2024-01-15 DIAGNOSIS — Z23 Encounter for immunization: Secondary | ICD-10-CM

## 2024-01-15 DIAGNOSIS — Z3A30 30 weeks gestation of pregnancy: Secondary | ICD-10-CM | POA: Diagnosis not present

## 2024-01-15 DIAGNOSIS — Z3480 Encounter for supervision of other normal pregnancy, unspecified trimester: Secondary | ICD-10-CM

## 2024-01-15 NOTE — Progress Notes (Signed)
   PRENATAL VISIT NOTE  Subjective:  Catherine Nixon is a 23 y.o. G3P2002 at [redacted]w[redacted]d being seen today for ongoing prenatal care.  She is currently monitored for the following issues for this low-risk pregnancy and has Language barrier affecting health care; Encounter for supervision of normal pregnancy, antepartum; Rubella non-immune status, antepartum; Abnormal chromosomal and genetic finding on antenatal screening mother; Asymptomatic bacteriuria; and Request for sterilization on their problem list.  Patient reports no complaints.  Contractions: Not present. Vag. Bleeding: None.  Movement: Present. Denies leaking of fluid.   The following portions of the patient's history were reviewed and updated as appropriate: allergies, current medications, past family history, past medical history, past social history, past surgical history and problem list.   Objective:   Vitals:   01/15/24 1022  BP: 122/66  Pulse: 76  Weight: 164 lb (74.4 kg)    Fetal Status: Fetal Heart Rate (bpm): 135 Fundal Height: 30 cm Movement: Present     General:  Alert, oriented and cooperative. Patient is in no acute distress.  Skin: Skin is warm and dry. No rash noted.   Cardiovascular: Normal heart rate noted  Respiratory: Normal respiratory effort, no problems with respiration noted  Abdomen: Soft, gravid, appropriate for gestational age.  Pain/Pressure: Absent     Pelvic: Cervical exam deferred        Extremities: Normal range of motion.  Edema: None  Mental Status: Normal mood and affect. Normal behavior. Normal judgment and thought content.   Assessment and Plan:  Pregnancy: G3P2002 at [redacted]w[redacted]d 1. Supervision of other normal pregnancy, antepartum (Primary) BP and FHR normal Doing well, feeling regular movement    2. [redacted] weeks gestation of pregnancy Tdap discussed and given today Does not desire BTL any longer   Preterm labor symptoms and general obstetric precautions including but not limited to vaginal  bleeding, contractions, leaking of fluid and fetal movement were reviewed in detail with the patient. Please refer to After Visit Summary for other counseling recommendations.   Return in about 2 weeks (around 01/29/2024) for OB VISIT (MD or APP).   Albertine Grates, FNP

## 2024-01-21 ENCOUNTER — Other Ambulatory Visit: Payer: Self-pay | Admitting: Women's Health

## 2024-01-21 DIAGNOSIS — Z3483 Encounter for supervision of other normal pregnancy, third trimester: Secondary | ICD-10-CM

## 2024-01-21 DIAGNOSIS — L299 Pruritus, unspecified: Secondary | ICD-10-CM

## 2024-01-27 ENCOUNTER — Encounter: Payer: Self-pay | Admitting: Women's Health

## 2024-01-29 ENCOUNTER — Encounter: Admitting: Advanced Practice Midwife

## 2024-01-31 ENCOUNTER — Ambulatory Visit (INDEPENDENT_AMBULATORY_CARE_PROVIDER_SITE_OTHER): Admitting: Obstetrics and Gynecology

## 2024-01-31 ENCOUNTER — Encounter: Payer: Self-pay | Admitting: Obstetrics and Gynecology

## 2024-01-31 VITALS — BP 87/62 | HR 80 | Wt 165.4 lb

## 2024-01-31 DIAGNOSIS — Z3483 Encounter for supervision of other normal pregnancy, third trimester: Secondary | ICD-10-CM | POA: Diagnosis not present

## 2024-01-31 DIAGNOSIS — L299 Pruritus, unspecified: Secondary | ICD-10-CM

## 2024-01-31 DIAGNOSIS — Z3A33 33 weeks gestation of pregnancy: Secondary | ICD-10-CM | POA: Diagnosis not present

## 2024-01-31 DIAGNOSIS — Z3480 Encounter for supervision of other normal pregnancy, unspecified trimester: Secondary | ICD-10-CM

## 2024-01-31 NOTE — Progress Notes (Signed)
   PRENATAL VISIT NOTE  Subjective:  Elisabetta Mishra is a 23 y.o. G3P2002 at [redacted]w[redacted]d being seen today for ongoing prenatal care.  She is currently monitored for the following issues for this low-risk pregnancy and has Language barrier affecting health care; Encounter for supervision of normal pregnancy, antepartum; Rubella non-immune status, antepartum; Abnormal chromosomal and genetic finding on antenatal screening mother; Asymptomatic bacteriuria; and Request for sterilization on their problem list.  Patient reports  past two weeks itching on hip/leg area, rash present, has tried hydrocortizone and other cream without relef .  Contractions: Not present.  .  Movement: Present. Denies leaking of fluid.   The following portions of the patient's history were reviewed and updated as appropriate: allergies, current medications, past family history, past medical history, past social history, past surgical history and problem list.   Objective:   Vitals:   01/31/24 1028  BP: (!) 87/62  Pulse: 80  Weight: 165 lb 6.4 oz (75 kg)    Fetal Status:     Movement: Present     General:  Alert, oriented and cooperative. Patient is in no acute distress.  Skin: Skin is warm and dry. No rash noted,   Cardiovascular: Normal heart rate noted  Respiratory: Normal respiratory effort, no problems with respiration noted  Abdomen: Soft, gravid, appropriate for gestational age.  Pain/Pressure: Present     Pelvic: Cervical exam deferred        Extremities: Normal range of motion.  Edema: None  Mental Status: Normal mood and affect. Normal behavior. Normal judgment and thought content.   Assessment and Plan:  Pregnancy: G3P2002 at [redacted]w[redacted]d 1. Supervision of other normal pregnancy, antepartum (Primary) BP and FHR normal Doing well, feeling regular movement  FH appropriate   2. [redacted] weeks gestation of pregnancy Anticipatory guidance regarding upcoming appts  3. Generalized pruritus Rash present on legs, Bile  acids and cmp ordered previously, collected today   Preterm labor symptoms and general obstetric precautions including but not limited to vaginal bleeding, contractions, leaking of fluid and fetal movement were reviewed in detail with the patient. Please refer to After Visit Summary for other counseling recommendations.   Return in about 2 weeks (around 02/14/2024) for OB VISIT (MD or APP).  Future Appointments  Date Time Provider Department Center  02/13/2024  9:50 AM Majel Scott, CNM [facility]  02/27/2024  9:50 AM Ferd Householder, CNM [facility]  03/05/2024  9:50 AM Ferd Householder, CNM [facility]  03/12/2024  9:50 AM Ferd Householder, CNM [facility]  03/19/2024  9:50 AM Ferd Householder, CNM [facility]    Susi Eric, FNP

## 2024-02-02 LAB — COMPREHENSIVE METABOLIC PANEL WITH GFR
ALT: 13 IU/L (ref 0–32)
AST: 15 IU/L (ref 0–40)
Albumin: 3.3 g/dL — ABNORMAL LOW (ref 4.0–5.0)
Alkaline Phosphatase: 176 IU/L — ABNORMAL HIGH (ref 44–121)
BUN/Creatinine Ratio: 21 (ref 9–23)
BUN: 10 mg/dL (ref 6–20)
Bilirubin Total: <0.2 mg/dL (ref 0.0–1.2)
CO2: 17 mmol/L — ABNORMAL LOW (ref 20–29)
Calcium: 9.1 mg/dL (ref 8.7–10.2)
Chloride: 108 mmol/L — ABNORMAL HIGH (ref 96–106)
Creatinine, Ser: 0.47 mg/dL — ABNORMAL LOW (ref 0.57–1.00)
Globulin, Total: 2.4 g/dL (ref 1.5–4.5)
Glucose: 88 mg/dL (ref 70–99)
Potassium: 4.2 mmol/L (ref 3.5–5.2)
Sodium: 141 mmol/L (ref 134–144)
Total Protein: 5.7 g/dL — ABNORMAL LOW (ref 6.0–8.5)
eGFR: 135 mL/min/{1.73_m2} (ref >59)

## 2024-02-02 LAB — BILE ACIDS, TOTAL: Bile Acids Total: 2.7 umol/L (ref 0.0–10.0)

## 2024-02-03 ENCOUNTER — Encounter: Payer: Self-pay | Admitting: Women's Health

## 2024-02-13 ENCOUNTER — Ambulatory Visit: Admitting: Advanced Practice Midwife

## 2024-02-13 VITALS — BP 99/66 | HR 86 | Wt 166.0 lb

## 2024-02-13 DIAGNOSIS — Z3480 Encounter for supervision of other normal pregnancy, unspecified trimester: Secondary | ICD-10-CM

## 2024-02-13 DIAGNOSIS — Z3A35 35 weeks gestation of pregnancy: Secondary | ICD-10-CM | POA: Diagnosis not present

## 2024-02-13 DIAGNOSIS — Z3483 Encounter for supervision of other normal pregnancy, third trimester: Secondary | ICD-10-CM

## 2024-02-13 NOTE — Patient Instructions (Signed)

## 2024-02-13 NOTE — Progress Notes (Signed)
   LOW-RISK PREGNANCY VISIT Patient name: Catherine Nixon MRN [id number]  Date of birth: [birth year] Chief Complaint:   Routine Prenatal Visit  History of Present Illness:   [name] is a 23 y.o. G56P2002 female at [redacted]w[redacted]d with an Estimated Date of Delivery: 03/19/24 being seen today for ongoing management of a low-risk pregnancy.  Today she reports no complaints.  Itching is gone. Contractions: Not present. Vag. Bleeding: None.  Movement: Present. denies leaking of fluid. Review of Systems:   Pertinent items are noted in HPI Denies abnormal vaginal discharge w/ itching/odor/irritation, headaches, visual changes, shortness of breath, chest pain, abdominal pain, severe nausea/vomiting, or problems with urination or bowel movements unless otherwise stated above. Pertinent History Reviewed:  Reviewed past medical,surgical, social, obstetrical and family history.  Reviewed problem list, medications and allergies. Physical Assessment:   Vitals:   02/13/24 1014  BP: 99/66  Pulse: 86  Weight: 166 lb (75.3 kg)  Body mass index is 32.42 kg/m.        Physical Examination:   General appearance: Well appearing, and in no distress  Mental status: Alert, oriented to person, place, and time  Skin: Warm & dry  Cardiovascular: Normal heart rate noted  Respiratory: Normal respiratory effort, no distress  Abdomen: Soft, gravid, nontender  Pelvic: Cervical exam deferred         Extremities:   Chaperone:  N/A   Fetal Status:     Movement: Present      No results found for this or any previous visit (from the past 24 hours).  Assessment & Plan:    Pregnancy: G3P2002 at [redacted]w[redacted]d There are no diagnoses linked to this encounter.    Meds: No orders of the defined types were placed in this encounter.  Labs/procedures today: none  Plan:  Continue routine obstetrical care  Next visit: prefers in person    Reviewed: Term labor symptoms and general obstetric precautions including but not limited  to vaginal bleeding, contractions, leaking of fluid and fetal movement were reviewed in detail with the patient.  All questions were answered. has home bp cuff. . Check bp weekly, let us  know if >140/90.   Follow-up: No follow-ups on file.  Future Appointments  Date Time Provider Department Center  02/27/2024  9:50 AM Ferd Householder, CNM [facility]  03/05/2024  9:50 AM Ferd Householder, CNM [facility]  03/12/2024  9:50 AM Ferd Householder, CNM [facility]  03/19/2024  9:50 AM Ferd Householder, CNM [facility]    No orders of the defined types were placed in this encounter.  Majel Scott DNP, CNM 02/13/2024 10:21 AM

## 2024-02-20 ENCOUNTER — Encounter: Admitting: Women's Health

## 2024-02-25 ENCOUNTER — Other Ambulatory Visit: Payer: Self-pay | Admitting: Women's Health

## 2024-02-27 ENCOUNTER — Encounter: Payer: Self-pay | Admitting: Women's Health

## 2024-02-27 ENCOUNTER — Other Ambulatory Visit (HOSPITAL_COMMUNITY)
Admission: RE | Admit: 2024-02-27 | Discharge: 2024-02-27 | Disposition: A | Discharge status: Home / Self Care | Source: Ambulatory Visit | Attending: Women's Health | Admitting: Women's Health

## 2024-02-27 ENCOUNTER — Ambulatory Visit: Admitting: Women's Health

## 2024-02-27 VITALS — BP 107/73 | HR 71 | Wt 169.8 lb

## 2024-02-27 DIAGNOSIS — Z3A37 37 weeks gestation of pregnancy: Secondary | ICD-10-CM

## 2024-02-27 DIAGNOSIS — Z3480 Encounter for supervision of other normal pregnancy, unspecified trimester: Secondary | ICD-10-CM

## 2024-02-27 DIAGNOSIS — Z3483 Encounter for supervision of other normal pregnancy, third trimester: Secondary | ICD-10-CM | POA: Diagnosis not present

## 2024-02-27 LAB — CERVICOVAGINAL ANCILLARY ONLY
Chlamydia: NEGATIVE
Comment: NEGATIVE
Comment: NORMAL
Neisseria Gonorrhea: NEGATIVE

## 2024-02-27 NOTE — Progress Notes (Signed)
 LOW-RISK PREGNANCY VISIT Patient name: Catherine Nixon MRN [id number]  Date of birth: [birth year] Chief Complaint:   Routine Prenatal Visit  History of Present Illness:   Catherine Nixon is a 23 y.o. G29P2002 female at [redacted]w[redacted]d with an Estimated Date of Delivery: 03/19/24 being seen today for ongoing management of a low-risk pregnancy.   Today she reports no complaints. Contractions: Irritability. Vag. Bleeding: None.  Movement: Present. denies leaking of fluid.     09/11/2023   11:12 AM 02/21/2022   10:24 AM 07/28/2021    9:39 AM 05/17/2021    3:31 PM 05/10/2021   11:26 AM  Depression screen PHQ 2/9  Decreased Interest 2 1 0 0 1  Down, Depressed, Hopeless 0 0 0 0 3  PHQ - 2 Score 2 1 0 0 4  Altered sleeping 2 1 2  0 2  Tired, decreased energy 3 2 2 2  0  Change in appetite 3 2 0 0 0  Feeling bad or failure about yourself  0 0 0 0 2  Trouble concentrating 2 1 2  0 0  Moving slowly or fidgety/restless 2 1 0 0 0  Suicidal thoughts 0 0 0 0 0  PHQ-9 Score 14 8 6 2 8   Difficult doing work/chores  Somewhat difficult Not difficult at all Not difficult at all Somewhat difficult        09/11/2023   11:13 AM 02/21/2022   10:25 AM 07/28/2021    9:40 AM 05/17/2021    3:32 PM  GAD 7 : Generalized Anxiety Score  Nervous, Anxious, on Edge 1 1 0 1  Control/stop worrying 0 0 0 0  Worry too much - different things 0 1 0 1  Trouble relaxing 2 1 1  0  Restless 2 1 0 1  Easily annoyed or irritable 3 1 2 1   Afraid - awful might happen 0 0 0 0  Total GAD 7 Score 8 5 3 4   Anxiety Difficulty  Somewhat difficult Not difficult at all Not difficult at all      Review of Systems:   Pertinent items are noted in HPI Denies abnormal vaginal discharge w/ itching/odor/irritation, headaches, visual changes, shortness of breath, chest pain, abdominal pain, severe nausea/vomiting, or problems with urination or bowel movements unless otherwise stated above. Pertinent History Reviewed:  Reviewed past  medical,surgical, social, obstetrical and family history.  Reviewed problem list, medications and allergies. Physical Assessment:   Vitals:   02/27/24 0958  BP: 107/73  Pulse: 71  Weight: 169 lb 12.8 oz (77 kg)  Body mass index is 33.16 kg/m.        Physical Examination:   General appearance: Well appearing, and in no distress  Mental status: Alert, oriented to person, place, and time  Skin: Warm & dry  Cardiovascular: Normal heart rate noted  Respiratory: Normal respiratory effort, no distress  Abdomen: Soft, gravid, nontender  Pelvic: Cervical exam performed  Dilation: 1 Effacement (%): Thick Station: -3  Extremities: Edema: None  Fetal Status: Fetal Heart Rate (bpm): 150 Fundal Height: 35 cm Movement: Present Presentation: Vertex  Chaperone: Latisha Cresenzo No results found for this or any previous visit (from the past 24 hours).  Assessment & Plan:  1) Low-risk pregnancy G3P2002 at [redacted]w[redacted]d with an Estimated Date of Delivery: 03/19/24    Meds: No orders of the defined types were placed in this encounter.  Labs/procedures today: GBS, GC/CT, and SVE  Plan:  Continue routine obstetrical care  Next visit: prefers in person  Reviewed: Term labor symptoms and general obstetric precautions including but not limited to vaginal bleeding, contractions, leaking of fluid and fetal movement were reviewed in detail with the patient.  All questions were answered. Does have home bp cuff. Office bp cuff given: not applicable. Check bp weekly, let us  know if consistently >140 and/or >90.  Follow-up: Return for As scheduled.  Future Appointments  Date Time Provider Department Center  03/05/2024  9:50 AM Ferd Householder, CNM [facility]  03/12/2024  9:50 AM Ferd Householder, CNM [facility]  03/19/2024  9:50 AM Ferd Householder, CNM [facility]    Orders Placed This Encounter  Procedures   Culture, beta strep (group b only)   Ferd Householder CNM,  [facility] 02/27/2024 10:12 AM

## 2024-02-27 NOTE — Patient Instructions (Signed)
 [place], thank you for choosing our office today! We appreciate the opportunity to meet your healthcare needs. You may receive a short survey by mail, e-mail, or through [employer]. If you are happy with your care we would appreciate if you could take just a few minutes to complete the survey questions. We read all of your comments and take your feedback very seriously. Thank you again for choosing our office.  Center for [employer] Team at [facility]  [facility] at [facility] ([address] [place], [place] [address]) Entrance C, located off of [address] Free 24/7 valet parking   CLASSES: Go to [email or web address] to register for classes (childbirth, breastfeeding, waterbirth, infant CPR, daddy bootcamp, etc.)  Call the office [phone]) or go to [facility] if: You begin to have strong, frequent contractions Your water  breaks.  Sometimes it is a big gush of fluid, sometimes it is just a trickle that keeps getting your panties wet or running down your legs You have vaginal bleeding.  It is normal to have a small amount of spotting if your cervix was checked.  You don't feel your baby moving like normal.  If you don't, get you something to eat and drink and lay down and focus on feeling your baby move.   If your baby is still not moving like normal, you should call the office or go to [facility].  Call the office [phone]) or go to [facility] hospital for these signs of pre-eclampsia: Severe headache that does not go away with Tylenol  Visual changes- seeing spots, double, blurred vision Pain under your right breast or upper abdomen that does not go away with Tums or heartburn medicine Nausea and/or vomiting Severe swelling in your hands, feet, and face   [place] Pediatricians/Family Doctors [facility]): [address] Meg Spina, [phone]           [facility]: [address], [phone]                 [facility]): [address], [phone] (call to ask if accepting patients) [facility] Department: [address], [place], [phone]    [facility] Pediatricians/Family Doctors [facility]): [address], [phone] [facility]: [address] [place], [phone] [facility]: [address], [phone]  [facility] Doctors  [facility]): [phone] [facility]: [address] [phone]   [facility] Doctors Doctors [facility]: [address], [phone]  [facility] Doctors  [facility]: [phone], [phone]  Home Blood Pressure Monitoring for Patients   Your provider has recommended that you check your blood pressure (BP) at least once a week at home. If you do not have a blood pressure cuff at home, one will be provided for you. Contact your provider if you have not received your monitor within 1 week.   Helpful Tips for Accurate Home Blood Pressure Checks  Don't smoke, exercise, or drink caffeine 30 minutes before checking your BP Use the restroom before checking your BP (a full bladder can raise your pressure) Relax in a comfortable upright chair Feet on the ground Left arm resting comfortably on a flat surface at the level of your heart Legs uncrossed Back supported Sit quietly and don't talk Place the cuff on your bare arm Adjust snuggly, so that only two fingertips  can fit between your skin and the top of the cuff Check 2 readings separated by at least one minute Keep a log of your BP readings For a visual, please reference this diagram: http://ccnc.care/bpdiagram  Provider Name: [facility]     Phone: [phone]  Zone 1: ALL CLEAR  Continue to monitor your symptoms:  BP reading is less than 140 (top number) or less than 90 (bottom number)  No right  upper stomach pain No headaches or seeing spots No feeling nauseated or throwing up No swelling in face and hands  Zone 2: CAUTION Call your doctor's office for any of the following:  BP reading is greater than 140 (top number) or greater than 90 (bottom number)  Stomach pain under your ribs in the middle or right side Headaches or seeing spots Feeling nauseated or throwing up Swelling in face and hands  Zone 3: EMERGENCY  Seek immediate medical care if you have any of the following:  BP reading is greater than160 (top number) or greater than 110 (bottom number) Severe headaches not improving with Tylenol  Serious difficulty catching your breath Any worsening symptoms from Zone 2   Braxton Hicks Contractions Contractions of the uterus can occur throughout pregnancy, but they are not always a sign that you are in labor. You may have practice contractions called Braxton Hicks contractions. These false labor contractions are sometimes confused with true labor. What are Glover Larve contractions? Braxton Hicks contractions are tightening movements that occur in the muscles of the uterus before labor. Unlike true labor contractions, these contractions do not result in opening (dilation) and thinning of the cervix. Toward the end of pregnancy (32-34 weeks), Braxton Hicks contractions can happen more often and may become stronger. These contractions are sometimes difficult to tell apart from true labor because they can be very uncomfortable. You should not feel embarrassed if you go to the hospital with false labor. Sometimes, the only way to tell if you are in true labor is for your health care provider to look for changes in the cervix. The health care provider will do a physical exam and may monitor your contractions. If you are not in true labor, the exam should show that your cervix is not dilating and your water  has not broken. If there are no other health problems associated with your  pregnancy, it is completely safe for you to be sent home with false labor. You may continue to have Braxton Hicks contractions until you go into true labor. How to tell the difference between true labor and false labor True labor Contractions last 30-70 seconds. Contractions become very regular. Discomfort is usually felt in the top of the uterus, and it spreads to the lower abdomen and low back. Contractions do not go away with walking. Contractions usually become more intense and increase in frequency. The cervix dilates and gets thinner. False labor Contractions are usually shorter and not as strong as true labor contractions. Contractions are usually irregular. Contractions are often felt in the front of the lower abdomen and in the groin. Contractions may go away when you walk around or change positions while lying down. Contractions get weaker and are shorter-lasting as time goes on. The cervix usually does not dilate or become thin. Follow these instructions at home:  Take over-the-counter and prescription medicines only as told by your health care provider. Keep up with your usual exercises and follow other instructions from your health care provider. Eat and drink lightly if you think  you are going into labor. If Braxton Hicks contractions are making you uncomfortable: Change your position from lying down or resting to walking, or change from walking to resting. Sit and rest in a tub of warm water . Drink enough fluid to keep your urine pale yellow. Dehydration may cause these contractions. Do slow and deep breathing several times an hour. Keep all follow-up prenatal visits as told by your health care provider. This is important. Contact a health care provider if: You have a fever. You have continuous pain in your abdomen. Get help right away if: Your contractions become stronger, more regular, and closer together. You have fluid leaking or gushing from your vagina. You pass  blood-tinged mucus (bloody show). You have bleeding from your vagina. You have low back pain that you never had before. You feel your baby's head pushing down and causing pelvic pressure. Your baby is not moving inside you as much as it used to. Summary Contractions that occur before labor are called Braxton Hicks contractions, false labor, or practice contractions. Braxton Hicks contractions are usually shorter, weaker, farther apart, and less regular than true labor contractions. True labor contractions usually become progressively stronger and regular, and they become more frequent. Manage discomfort from [facility] contractions by changing position, resting in a warm bath, drinking plenty of water , or practicing deep breathing. This information is not intended to replace advice given to you by your health care provider. Make sure you discuss any questions you have with your health care provider. Document Revised: 09/06/2017 Document Reviewed: 02/07/2017 Elsevier Patient Education  2020 [employer].

## 2024-02-28 ENCOUNTER — Encounter: Payer: Self-pay | Admitting: Women's Health

## 2024-03-02 LAB — CULTURE, BETA STREP (GROUP B ONLY): Strep Gp B Culture: NEGATIVE

## 2024-03-04 DIAGNOSIS — Z3483 Encounter for supervision of other normal pregnancy, third trimester: Secondary | ICD-10-CM | POA: Diagnosis not present

## 2024-03-04 DIAGNOSIS — Z3482 Encounter for supervision of other normal pregnancy, second trimester: Secondary | ICD-10-CM | POA: Diagnosis not present

## 2024-03-05 ENCOUNTER — Encounter: Payer: Self-pay | Admitting: Women's Health

## 2024-03-05 ENCOUNTER — Ambulatory Visit: Admitting: Women's Health

## 2024-03-05 VITALS — BP 104/68 | HR 71 | Wt 171.4 lb

## 2024-03-05 DIAGNOSIS — Z3483 Encounter for supervision of other normal pregnancy, third trimester: Secondary | ICD-10-CM

## 2024-03-05 DIAGNOSIS — Z3A38 38 weeks gestation of pregnancy: Secondary | ICD-10-CM

## 2024-03-05 NOTE — Patient Instructions (Signed)
 Catherine Nixon, thank you for choosing our office today! We appreciate the opportunity to meet your healthcare needs. You may receive a short survey by mail, e-mail, or through [employer]. If you are happy with your care we would appreciate if you could take just a few minutes to complete the survey questions. We read all of your comments and take your feedback very seriously. Thank you again for choosing our office.  Center for [employer] Team at [facility]  [facility] Center at [facility] [place] ([address] [place], [place] [address]) Entrance C, located off of [address] Free 24/7 valet parking   CLASSES: Go to [email or web address] to register for classes (childbirth, breastfeeding, waterbirth, infant CPR, daddy bootcamp, etc.)  Call the office [phone]) or go to [facility] if: You begin to have strong, frequent contractions Your water  breaks.  Sometimes it is a big gush of fluid, sometimes it is just a trickle that keeps getting your panties wet or running down your legs You have vaginal bleeding.  It is normal to have a small amount of spotting if your cervix was checked.  You don't feel your baby moving like normal.  If you don't, get you something to eat and drink and lay down and focus on feeling your baby move.   If your baby is still not moving like normal, you should call the office or go to [facility].  Call the office [phone]) or go to [facility] hospital for these signs of pre-eclampsia: Severe headache that does not go away with Tylenol  Visual changes- seeing spots, double, blurred vision Pain under your right breast or upper abdomen that does not go away with Tums or heartburn medicine Nausea and/or vomiting Severe swelling in your hands, feet, and face   [place] Pediatricians/Family Doctors [facility]): [address] Meg Spina, [phone]           [facility]: [address], [phone]                 [facility]): [address], [phone] (call to ask if accepting patients) [facility] Department: [address], [place], [phone]    [facility] Pediatricians/Family Doctors [facility]): [address], [phone] [facility]: [address] [place], [phone] [facility]: [address], [phone]  [facility] Doctors  [facility]): [phone] [facility]: [address] [phone]   [facility] Doctors [facility]: [address], [phone]  [facility] Doctors  [facility]: [phone], [phone]  Home Blood Pressure Monitoring for Patients   Your provider has recommended that you check your blood pressure (BP) at least once a week at home. If you do not have a blood pressure cuff at home, one will be provided for you. Contact your provider if you have not received your monitor within 1 week.   Helpful Tips for Accurate Home Blood Pressure Checks  Don't smoke, exercise, or drink caffeine 30 minutes before checking your BP Use the restroom before checking your BP (a full bladder can raise your pressure) Relax in a comfortable upright chair Feet on the ground Left arm resting comfortably on a flat surface at the level of your heart Legs uncrossed Back supported Sit quietly and don't talk Place the cuff on your bare arm Adjust snuggly, so that only two fingertips  can fit between your skin and the top of the cuff Check 2 readings separated by at least one minute Keep a log of your BP readings For a visual, please reference this diagram: http://ccnc.care/bpdiagram  Provider Name: [facility]     Phone: [phone]  Zone 1: ALL CLEAR  Continue to monitor your symptoms:  BP reading is less than 140 (top number) or less than 90 (bottom number)  No right  upper stomach pain No headaches or seeing spots No feeling nauseated or throwing up No swelling in face and hands  Zone 2: CAUTION Call your doctor's office for any of the following:  BP reading is greater than 140 (top number) or greater than 90 (bottom number)  Stomach pain under your ribs in the middle or right side Headaches or seeing spots Feeling nauseated or throwing up Swelling in face and hands  Zone 3: EMERGENCY  Seek immediate medical care if you have any of the following:  BP reading is greater than160 (top number) or greater than 110 (bottom number) Severe headaches not improving with Tylenol  Serious difficulty catching your breath Any worsening symptoms from Zone 2   Braxton Hicks Contractions Contractions of the uterus can occur throughout pregnancy, but they are not always a sign that you are in labor. You may have practice contractions called Braxton Hicks contractions. These false labor contractions are sometimes confused with true labor. What are Glover Larve contractions? Braxton Hicks contractions are tightening movements that occur in the muscles of the uterus before labor. Unlike true labor contractions, these contractions do not result in opening (dilation) and thinning of the cervix. Toward the end of pregnancy (32-34 weeks), Braxton Hicks contractions can happen more often and may become stronger. These contractions are sometimes difficult to tell apart from true labor because they can be very uncomfortable. You should not feel embarrassed if you go to the hospital with false labor. Sometimes, the only way to tell if you are in true labor is for your health care provider to look for changes in the cervix. The health care provider will do a physical exam and may monitor your contractions. If you are not in true labor, the exam should show that your cervix is not dilating and your water  has not broken. If there are no other health problems associated with your  pregnancy, it is completely safe for you to be sent home with false labor. You may continue to have Braxton Hicks contractions until you go into true labor. How to tell the difference between true labor and false labor True labor Contractions last 30-70 seconds. Contractions become very regular. Discomfort is usually felt in the top of the uterus, and it spreads to the lower abdomen and low back. Contractions do not go away with walking. Contractions usually become more intense and increase in frequency. The cervix dilates and gets thinner. False labor Contractions are usually shorter and not as strong as true labor contractions. Contractions are usually irregular. Contractions are often felt in the front of the lower abdomen and in the groin. Contractions may go away when you walk around or change positions while lying down. Contractions get weaker and are shorter-lasting as time goes on. The cervix usually does not dilate or become thin. Follow these instructions at home:  Take over-the-counter and prescription medicines only as told by your health care provider. Keep up with your usual exercises and follow other instructions from your health care provider. Eat and drink lightly if you think  you are going into labor. If Braxton Hicks contractions are making you uncomfortable: Change your position from lying down or resting to walking, or change from walking to resting. Sit and rest in a tub of warm water . Drink enough fluid to keep your urine pale yellow. Dehydration may cause these contractions. Do slow and deep breathing several times an hour. Keep all follow-up prenatal visits as told by your health care provider. This is important. Contact a health care provider if: You have a fever. You have continuous pain in your abdomen. Get help right away if: Your contractions become stronger, more regular, and closer together. You have fluid leaking or gushing from your vagina. You pass  blood-tinged mucus (bloody show). You have bleeding from your vagina. You have low back pain that you never had before. You feel your baby's head pushing down and causing pelvic pressure. Your baby is not moving inside you as much as it used to. Summary Contractions that occur before labor are called Braxton Hicks contractions, false labor, or practice contractions. Braxton Hicks contractions are usually shorter, weaker, farther apart, and less regular than true labor contractions. True labor contractions usually become progressively stronger and regular, and they become more frequent. Manage discomfort from [facility] contractions by changing position, resting in a warm bath, drinking plenty of water , or practicing deep breathing. This information is not intended to replace advice given to you by your health care provider. Make sure you discuss any questions you have with your health care provider. Document Revised: 09/06/2017 Document Reviewed: 02/07/2017 Elsevier Patient Education  2020 [employer].

## 2024-03-05 NOTE — Progress Notes (Signed)
 LOW-RISK PREGNANCY VISIT Patient name: Catherine Nixon MRN [id number]  Date of birth: [birth year] Chief Complaint:   Routine Prenatal Visit  History of Present Illness:   Catherine Nixon is a 23 y.o. G88P2002 female at [redacted]w[redacted]d with an Estimated Date of Delivery: 03/19/24 being seen today for ongoing management of a low-risk pregnancy.   Today she reports no complaints. Contractions: Irritability. Vag. Bleeding: None.  Movement: Present. denies leaking of fluid.     09/11/2023   11:12 AM 02/21/2022   10:24 AM 07/28/2021    9:39 AM 05/17/2021    3:31 PM 05/10/2021   11:26 AM  Depression screen PHQ 2/9  Decreased Interest 2 1 0 0 1  Down, Depressed, Hopeless 0 0 0 0 3  PHQ - 2 Score 2 1 0 0 4  Altered sleeping 2 1 2  0 2  Tired, decreased energy 3 2 2 2  0  Change in appetite 3 2 0 0 0  Feeling bad or failure about yourself  0 0 0 0 2  Trouble concentrating 2 1 2  0 0  Moving slowly or fidgety/restless 2 1 0 0 0  Suicidal thoughts 0 0 0 0 0  PHQ-9 Score 14 8 6 2 8   Difficult doing work/chores  Somewhat difficult Not difficult at all Not difficult at all Somewhat difficult        09/11/2023   11:13 AM 02/21/2022   10:25 AM 07/28/2021    9:40 AM 05/17/2021    3:32 PM  GAD 7 : Generalized Anxiety Score  Nervous, Anxious, on Edge 1 1 0 1  Control/stop worrying 0 0 0 0  Worry too much - different things 0 1 0 1  Trouble relaxing 2 1 1  0  Restless 2 1 0 1  Easily annoyed or irritable 3 1 2 1   Afraid - awful might happen 0 0 0 0  Total GAD 7 Score 8 5 3 4   Anxiety Difficulty  Somewhat difficult Not difficult at all Not difficult at all      Review of Systems:   Pertinent items are noted in HPI Denies abnormal vaginal discharge w/ itching/odor/irritation, headaches, visual changes, shortness of breath, chest pain, abdominal pain, severe nausea/vomiting, or problems with urination or bowel movements unless otherwise stated above. Pertinent History Reviewed:  Reviewed past  medical,surgical, social, obstetrical and family history.  Reviewed problem list, medications and allergies. Physical Assessment:   Vitals:   03/05/24 0945  BP: 104/68  Pulse: 71  Weight: 171 lb 6.4 oz (77.7 kg)  Body mass index is 33.47 kg/m.        Physical Examination:   General appearance: Well appearing, and in no distress  Mental status: Alert, oriented to person, place, and time  Skin: Warm & dry  Cardiovascular: Normal heart rate noted  Respiratory: Normal respiratory effort, no distress  Abdomen: Soft, gravid, nontender  Pelvic: Cervical exam performed  Dilation: 1 Effacement (%): 20 Station: Ballotable  Extremities: Edema: None  Fetal Status: Fetal Heart Rate (bpm): 120 Fundal Height: 36 cm Movement: Present Presentation: Vertex  Chaperone: Lorean Rodes No results found for this or any previous visit (from the past 24 hours).  Assessment & Plan:  1) Low-risk pregnancy G3P2002 at [redacted]w[redacted]d with an Estimated Date of Delivery: 03/19/24    Meds: No orders of the defined types were placed in this encounter.  Labs/procedures today: SVE  Plan:  Continue routine obstetrical care  Next visit: prefers in person    Reviewed: Term  labor symptoms and general obstetric precautions including but not limited to vaginal bleeding, contractions, leaking of fluid and fetal movement were reviewed in detail with the patient.  All questions were answered. Does have home bp cuff. Office bp cuff given: not applicable. Check bp weekly, let us  know if consistently >140 and/or >90.  Follow-up: Return for As scheduled.  Future Appointments  Date Time Provider Department Center  03/12/2024  9:50 AM Ferd Householder, CNM [facility]  03/19/2024  9:50 AM Ferd Householder, CNM [facility]    No orders of the defined types were placed in this encounter.  Ferd Householder CNM, [facility] 03/05/2024 10:08 AM

## 2024-03-12 ENCOUNTER — Ambulatory Visit: Admitting: Women's Health

## 2024-03-12 ENCOUNTER — Encounter: Payer: Self-pay | Admitting: Women's Health

## 2024-03-12 VITALS — BP 112/75 | HR 75 | Wt 175.0 lb

## 2024-03-12 DIAGNOSIS — Z3A39 39 weeks gestation of pregnancy: Secondary | ICD-10-CM | POA: Diagnosis not present

## 2024-03-12 DIAGNOSIS — Z3483 Encounter for supervision of other normal pregnancy, third trimester: Secondary | ICD-10-CM

## 2024-03-12 NOTE — Patient Instructions (Signed)
 Catherine Nixon, thank you for choosing our office today! We appreciate the opportunity to meet your healthcare needs. You may receive a short survey by mail, e-mail, or through [employer]. If you are happy with your care we would appreciate if you could take just a few minutes to complete the survey questions. We read all of your comments and take your feedback very seriously. Thank you again for choosing our office.  Center for [employer] Team at [facility]  [facility] at [facility] ([address] [place], [place] [address]) Entrance C, located off of [address] Free 24/7 valet parking   CLASSES: Go to [email or web address] to register for classes (childbirth, breastfeeding, waterbirth, infant CPR, daddy bootcamp, etc.)  Call the office [phone]) or go to [facility] if: You begin to have strong, frequent contractions Your water  breaks.  Sometimes it is a big gush of fluid, sometimes it is just a trickle that keeps getting your panties wet or running down your legs You have vaginal bleeding.  It is normal to have a small amount of spotting if your cervix was checked.  You don't feel your baby moving like normal.  If you don't, get you something to eat and drink and lay down and focus on feeling your baby move.   If your baby is still not moving like normal, you should call the office or go to [facility].  Call the office [phone]) or go to [facility] hospital for these signs of pre-eclampsia: Severe headache that does not go away with Tylenol  Visual changes- seeing spots, double, blurred vision Pain under your right breast or upper abdomen that does not go away with Tums or heartburn medicine Nausea and/or vomiting Severe swelling in your hands, feet, and face   [place] Pediatricians/Family Doctors [facility]): [address] Meg Spina, [phone]           [facility]: [address], [phone]                 [facility]): [address], [phone] (call to ask if accepting patients) [facility] Department: [address], [place], [phone]    [facility] Pediatricians/Family Doctors [facility]): [address], [phone] [facility]: [address] [place], [phone] [facility]: [address], [phone]  [facility] Doctors  [facility]): [phone] [facility]: [address] [phone]   [facility] Doctors Doctors [facility]: [address], [phone]  [facility] Doctors  [facility]: [phone], [phone]  Home Blood Pressure Monitoring for Patients   Your provider has recommended that you check your blood pressure (BP) at least once a week at home. If you do not have a blood pressure cuff at home, one will be provided for you. Contact your provider if you have not received your monitor within 1 week.   Helpful Tips for Accurate Home Blood Pressure Checks  Don't smoke, exercise, or drink caffeine 30 minutes before checking your BP Use the restroom before checking your BP (a full bladder can raise your pressure) Relax in a comfortable upright chair Feet on the ground Left arm resting comfortably on a flat surface at the level of your heart Legs uncrossed Back supported Sit quietly and don't talk Place the cuff on your bare arm Adjust snuggly, so that only two fingertips  can fit between your skin and the top of the cuff Check 2 readings separated by at least one minute Keep a log of your BP readings For a visual, please reference this diagram: http://ccnc.care/bpdiagram  Provider Name: [facility]     Phone: [phone]  Zone 1: ALL CLEAR  Continue to monitor your symptoms:  BP reading is less than 140 (top number) or less than 90 (bottom number)  No right  upper stomach pain No headaches or seeing spots No feeling nauseated or throwing up No swelling in face and hands  Zone 2: CAUTION Call your doctor's office for any of the following:  BP reading is greater than 140 (top number) or greater than 90 (bottom number)  Stomach pain under your ribs in the middle or right side Headaches or seeing spots Feeling nauseated or throwing up Swelling in face and hands  Zone 3: EMERGENCY  Seek immediate medical care if you have any of the following:  BP reading is greater than160 (top number) or greater than 110 (bottom number) Severe headaches not improving with Tylenol  Serious difficulty catching your breath Any worsening symptoms from Zone 2   Braxton Hicks Contractions Contractions of the uterus can occur throughout pregnancy, but they are not always a sign that you are in labor. You may have practice contractions called Braxton Hicks contractions. These false labor contractions are sometimes confused with true labor. What are [place] Larve contractions? Braxton Hicks contractions are tightening movements that occur in the muscles of the uterus before labor. Unlike true labor contractions, these contractions do not result in opening (dilation) and thinning of the cervix. Toward the end of pregnancy (32-34 weeks), Braxton Hicks contractions can happen more often and may become stronger. These contractions are sometimes difficult to tell apart from true labor because they can be very uncomfortable. You should not feel embarrassed if you go to the hospital with false labor. Sometimes, the only way to tell if you are in true labor is for your health care provider to look for changes in the cervix. The health care provider will do a physical exam and may monitor your contractions. If you are not in true labor, the exam should show that your cervix is not dilating and your water  has not broken. If there are no other health problems associated with your  pregnancy, it is completely safe for you to be sent home with false labor. You may continue to have Braxton Hicks contractions until you go into true labor. How to tell the difference between true labor and false labor True labor Contractions last 30-70 seconds. Contractions become very regular. Discomfort is usually felt in the top of the uterus, and it spreads to the lower abdomen and low back. Contractions do not go away with walking. Contractions usually become more intense and increase in frequency. The cervix dilates and gets thinner. False labor Contractions are usually shorter and not as strong as true labor contractions. Contractions are usually irregular. Contractions are often felt in the front of the lower abdomen and in the groin. Contractions may go away when you walk around or change positions while lying down. Contractions get weaker and are shorter-lasting as time goes on. The cervix usually does not dilate or become thin. Follow these instructions at home:  Take over-the-counter and prescription medicines only as told by your health care provider. Keep up with your usual exercises and follow other instructions from your health care provider. Eat and drink lightly if you think  you are going into labor. If Braxton Hicks contractions are making you uncomfortable: Change your position from lying down or resting to walking, or change from walking to resting. Sit and rest in a tub of warm water . Drink enough fluid to keep your urine pale yellow. Dehydration may cause these contractions. Do slow and deep breathing several times an hour. Keep all follow-up prenatal visits as told by your health care provider. This is important. Contact a health care provider if: You have a fever. You have continuous pain in your abdomen. Get help right away if: Your contractions become stronger, more regular, and closer together. You have fluid leaking or gushing from your vagina. You pass  blood-tinged mucus (bloody show). You have bleeding from your vagina. You have low back pain that you never had before. You feel your baby's head pushing down and causing pelvic pressure. Your baby is not moving inside you as much as it used to. Summary Contractions that occur before labor are called Braxton Hicks contractions, false labor, or practice contractions. Braxton Hicks contractions are usually shorter, weaker, farther apart, and less regular than true labor contractions. True labor contractions usually become progressively stronger and regular, and they become more frequent. Manage discomfort from [facility] contractions by changing position, resting in a warm bath, drinking plenty of water , or practicing deep breathing. This information is not intended to replace advice given to you by your health care provider. Make sure you discuss any questions you have with your health care provider. Document Revised: 09/06/2017 Document Reviewed: 02/07/2017 Elsevier Patient Education  2020 [employer].

## 2024-03-12 NOTE — Progress Notes (Signed)
 LOW-RISK PREGNANCY VISIT Patient name: Catherine Nixon MRN [id number]  Date of birth: 12/24/2000 Chief Complaint:   Routine Prenatal Visit  History of Present Illness:   Catherine Nixon is a 23 y.o. G22P2002 female at [redacted]w[redacted]d with an Estimated Date of Delivery: 03/19/24 being seen today for ongoing management of a low-risk pregnancy.   Today she reports no complaints. Contractions: Not present. Vag. Bleeding: None.  Movement: Present. denies leaking of fluid.     09/11/2023   11:12 AM 02/21/2022   10:24 AM 07/28/2021    9:39 AM 05/17/2021    3:31 PM 05/10/2021   11:26 AM  Depression screen PHQ 2/9  Decreased Interest 2 1 0 0 1  Down, Depressed, Hopeless 0 0 0 0 3  PHQ - 2 Score 2 1 0 0 4  Altered sleeping 2 1 2  0 2  Tired, decreased energy 3 2 2 2  0  Change in appetite 3 2 0 0 0  Feeling bad or failure about yourself  0 0 0 0 2  Trouble concentrating 2 1 2  0 0  Moving slowly or fidgety/restless 2 1 0 0 0  Suicidal thoughts 0 0 0 0 0  PHQ-9 Score 14 8 6 2 8   Difficult doing work/chores  Somewhat difficult Not difficult at all Not difficult at all Somewhat difficult        09/11/2023   11:13 AM 02/21/2022   10:25 AM 07/28/2021    9:40 AM 05/17/2021    3:32 PM  GAD 7 : Generalized Anxiety Score  Nervous, Anxious, on Edge 1 1 0 1  Control/stop worrying 0 0 0 0  Worry too much - different things 0 1 0 1  Trouble relaxing 2 1 1  0  Restless 2 1 0 1  Easily annoyed or irritable 3 1 2 1   Afraid - awful might happen 0 0 0 0  Total GAD 7 Score 8 5 3 4   Anxiety Difficulty  Somewhat difficult Not difficult at all Not difficult at all      Review of Systems:   Pertinent items are noted in HPI Denies abnormal vaginal discharge w/ itching/odor/irritation, headaches, visual changes, shortness of breath, chest pain, abdominal pain, severe nausea/vomiting, or problems with urination or bowel movements unless otherwise stated above. Pertinent History Reviewed:  Reviewed past  medical,surgical, social, obstetrical and family history.  Reviewed problem list, medications and allergies. Physical Assessment:   Vitals:   03/12/24 0954  BP: 112/75  Pulse: 75  Weight: 175 lb (79.4 kg)  Body mass index is 34.18 kg/m.        Physical Examination:   General appearance: Well appearing, and in no distress  Mental status: Alert, oriented to person, place, and time  Skin: Warm & dry  Cardiovascular: Normal heart rate noted  Respiratory: Normal respiratory effort, no distress  Abdomen: Soft, gravid, nontender  Pelvic: Cervical exam deferred         Extremities: Edema: None  Fetal Status: Fetal Heart Rate (bpm): 120 Fundal Height: 37 cm Movement: Present    Chaperone: N/A No results found for this or any previous visit (from the past 24 hours).  Assessment & Plan:  1) Low-risk pregnancy G3P2002 at [redacted]w[redacted]d with an Estimated Date of Delivery: 03/19/24   Meds: No orders of the defined types were placed in this encounter.  Labs/procedures today: none  Plan:  Continue routine obstetrical care  Next visit: prefers will be in person for postdates NST    Reviewed:  Term labor symptoms and general obstetric precautions including but not limited to vaginal bleeding, contractions, leaking of fluid and fetal movement were reviewed in detail with the patient.  All questions were answered. Does have home bp cuff. Office bp cuff given: not applicable. Check bp weekly, let us  know if consistently >140 and/or >90.  Follow-up: Return for As scheduled- add NST.  Future Appointments  Date Time Provider Department Center  03/19/2024  9:50 AM Ferd Householder, CNM [facility]    No orders of the defined types were placed in this encounter.  Ferd Householder CNM, [facility] 03/12/2024 10:04 AM

## 2024-03-19 ENCOUNTER — Ambulatory Visit: Admitting: Advanced Practice Midwife

## 2024-03-19 VITALS — BP 108/70 | HR 86 | Wt 175.0 lb

## 2024-03-19 DIAGNOSIS — Z3480 Encounter for supervision of other normal pregnancy, unspecified trimester: Secondary | ICD-10-CM

## 2024-03-19 DIAGNOSIS — Z3A40 40 weeks gestation of pregnancy: Secondary | ICD-10-CM

## 2024-03-19 DIAGNOSIS — Z3483 Encounter for supervision of other normal pregnancy, third trimester: Secondary | ICD-10-CM | POA: Diagnosis not present

## 2024-03-19 NOTE — Progress Notes (Signed)
   LOW-RISK PREGNANCY VISIT Patient name: Catherine Nixon MRN [id number]  Date of birth: 12-Feb-2001 Chief Complaint:   Routine Prenatal Visit  History of Present Illness:   Catherine Nixon is a 23 y.o. G80P2002 female at [redacted]w[redacted]d with an Estimated Date of Delivery: 03/19/24 being seen today for ongoing management of a low-risk pregnancy.  Today she reports no complaints. Contractions: Not present.  .  Movement: Present. denies leaking of fluid. Review of Systems:   Pertinent items are noted in HPI Denies abnormal vaginal discharge w/ itching/odor/irritation, headaches, visual changes, shortness of breath, chest pain, abdominal pain, severe nausea/vomiting, or problems with urination or bowel movements unless otherwise stated above. Pertinent History Reviewed:  Reviewed past medical,surgical, social, obstetrical and family history.  Reviewed problem list, medications and allergies. Physical Assessment:   Vitals:   03/19/24 1152  BP: 108/70  Pulse: 86  Weight: 175 lb (79.4 kg)  Body mass index is 34.18 kg/m.        Physical Examination:   General appearance: Well appearing, and in no distress  Mental status: Alert, oriented to person, place, and time  Skin: Warm & dry  Cardiovascular: Normal heart rate noted  Respiratory: Normal respiratory effort, no distress  Abdomen: Soft, gravid, nontender  Pelvic: Cervical exam deferred , declined        Extremities: Edema: None Chaperone:  N/A   Fetal Status:     Movement: Present  NST: FHR baseline 130 bpm, Variability: moderate, Accelerations:present, Decelerations:  Absent= Cat 1/Reactive     No results found for this or any previous visit (from the past 24 hours).  Assessment & Plan:    Pregnancy: G3P2002 at [redacted]w[redacted]d 1. Supervision of other normal pregnancy, antepartum (Primary)   2. [redacted] weeks gestation of pregnancy  IOL set for next week, orders in    Meds: No orders of the defined types were placed in this  encounter.  Labs/procedures today: NST  Plan: IOL set for 1 weekss     Reviewed: Term labor symptoms and general obstetric precautions including but not limited to vaginal bleeding, contractions, leaking of fluid and fetal movement were reviewed in detail with the patient.  All questions were answered. Has home bp cuff. Check bp daliy, let us  know if >140/90.   Follow-up: No follow-ups on file.  Future Appointments  Date Time Provider Department Center  03/26/2024  7:15 AM [facility] SCHED ROOM [facility] None    No orders of the defined types were placed in this encounter.  Majel Scott DNP, CNM 03/19/2024 12:14 PM

## 2024-03-19 NOTE — Patient Instructions (Signed)
 If you are still pregnant on 03/26/24, Labor and Delivery will call you when they have a slot open.  They will give you about an hour to get there.  If you are late (or don't answer your phone) your time slot will be given to the next person in line and you will be given a later time slot.  You will go to [employer] [facility] at [facility], ([address] Entrance C in [place], [place]) to start your induction!  Eat a light meal before you come.  Delynn Fill!!

## 2024-03-20 ENCOUNTER — Telehealth (HOSPITAL_COMMUNITY): Payer: Self-pay | Admitting: *Deleted

## 2024-03-20 NOTE — Telephone Encounter (Signed)
 Preadmission screen

## 2024-03-24 ENCOUNTER — Ambulatory Visit: Admitting: *Deleted

## 2024-03-24 ENCOUNTER — Other Ambulatory Visit: Payer: Self-pay

## 2024-03-24 ENCOUNTER — Telehealth (HOSPITAL_COMMUNITY): Payer: Self-pay | Admitting: *Deleted

## 2024-03-24 ENCOUNTER — Inpatient Hospital Stay (HOSPITAL_COMMUNITY)
Admission: AD | Admit: 2024-03-24 | Discharge: 2024-03-27 | DRG: 807 | Disposition: A | Discharge status: Home / Self Care | Attending: Obstetrics and Gynecology | Admitting: Obstetrics and Gynecology

## 2024-03-24 ENCOUNTER — Encounter (HOSPITAL_COMMUNITY): Payer: Self-pay | Admitting: *Deleted

## 2024-03-24 ENCOUNTER — Encounter (HOSPITAL_COMMUNITY): Payer: Self-pay | Admitting: Family Medicine

## 2024-03-24 VITALS — BP 115/73 | HR 81

## 2024-03-24 DIAGNOSIS — Z2839 Other underimmunization status: Secondary | ICD-10-CM

## 2024-03-24 DIAGNOSIS — Z148 Genetic carrier of other disease: Secondary | ICD-10-CM | POA: Diagnosis not present

## 2024-03-24 DIAGNOSIS — O368130 Decreased fetal movements, third trimester, not applicable or unspecified: Secondary | ICD-10-CM

## 2024-03-24 DIAGNOSIS — Z3A40 40 weeks gestation of pregnancy: Secondary | ICD-10-CM

## 2024-03-24 DIAGNOSIS — O9902 Anemia complicating childbirth: Secondary | ICD-10-CM | POA: Diagnosis present

## 2024-03-24 DIAGNOSIS — Z3480 Encounter for supervision of other normal pregnancy, unspecified trimester: Principal | ICD-10-CM

## 2024-03-24 DIAGNOSIS — O480 Post-term pregnancy: Secondary | ICD-10-CM

## 2024-03-24 DIAGNOSIS — Z302 Encounter for sterilization: Secondary | ICD-10-CM

## 2024-03-24 DIAGNOSIS — R8271 Bacteriuria: Secondary | ICD-10-CM

## 2024-03-24 DIAGNOSIS — O99214 Obesity complicating childbirth: Secondary | ICD-10-CM | POA: Diagnosis present

## 2024-03-24 DIAGNOSIS — Z833 Family history of diabetes mellitus: Secondary | ICD-10-CM | POA: Diagnosis not present

## 2024-03-24 LAB — CBC
HCT: 31.1 % — ABNORMAL LOW (ref 36.0–46.0)
Hemoglobin: 9.7 g/dL — ABNORMAL LOW (ref 12.0–15.0)
MCH: 25 pg — ABNORMAL LOW (ref 26.0–34.0)
MCHC: 31.2 g/dL (ref 30.0–36.0)
MCV: 80.2 fL (ref 80.0–100.0)
Platelets: 293 10*3/uL (ref 150–400)
RBC: 3.88 MIL/uL (ref 3.87–5.11)
RDW: 15.5 % (ref 11.5–15.5)
WBC: 6.4 10*3/uL (ref 4.0–10.5)
nRBC: 0 % (ref 0.0–0.2)

## 2024-03-24 LAB — TYPE AND SCREEN
ABO/RH(D): O POS
Antibody Screen: NEGATIVE

## 2024-03-24 MED ORDER — OXYTOCIN BOLUS FROM INFUSION
333.0000 mL | Freq: Once | INTRAVENOUS | Status: AC
  Administered 2024-03-25: 333 mL via INTRAVENOUS

## 2024-03-24 MED ORDER — SOD CITRATE-CITRIC ACID 500-334 MG/5ML PO SOLN: 30.0000 mL | ORAL | Status: DC | PRN

## 2024-03-24 MED ORDER — OXYCODONE-ACETAMINOPHEN 5-325 MG PO TABS: 2.0000 | ORAL_TABLET | ORAL | Status: DC | PRN

## 2024-03-24 MED ORDER — LACTATED RINGERS IV SOLN: INTRAVENOUS | Status: DC

## 2024-03-24 MED ORDER — FENTANYL CITRATE (PF) 100 MCG/2ML IJ SOLN
100.0000 ug | INTRAMUSCULAR | Status: DC | PRN
  Administered 2024-03-25 (×2): 100 ug via INTRAVENOUS
  Filled 2024-03-24 (×2): qty 2

## 2024-03-24 MED ORDER — ONDANSETRON HCL 4 MG/2ML IJ SOLN: 4.0000 mg | Freq: Four times a day (QID) | INTRAMUSCULAR | Status: DC | PRN

## 2024-03-24 MED ORDER — LIDOCAINE HCL (PF) 1 % IJ SOLN: 30.0000 mL | INTRAMUSCULAR | Status: DC | PRN

## 2024-03-24 MED ORDER — OXYTOCIN-SODIUM CHLORIDE 30-0.9 UT/500ML-% IV SOLN: 2.5000 [IU]/h | INTRAVENOUS | Status: DC

## 2024-03-24 MED ORDER — LACTATED RINGERS IV SOLN
500.0000 mL | INTRAVENOUS | Status: DC | PRN
  Administered 2024-03-25: 500 mL via INTRAVENOUS

## 2024-03-24 MED ORDER — FLEET ENEMA RE ENEM: 1.0000 | ENEMA | RECTAL | Status: DC | PRN

## 2024-03-24 MED ORDER — HYDROXYZINE HCL 50 MG PO TABS: 50.0000 mg | ORAL_TABLET | Freq: Four times a day (QID) | ORAL | Status: DC | PRN

## 2024-03-24 MED ORDER — OXYCODONE-ACETAMINOPHEN 5-325 MG PO TABS: 1.0000 | ORAL_TABLET | ORAL | Status: DC | PRN

## 2024-03-24 MED ORDER — ACETAMINOPHEN 325 MG PO TABS
650.0000 mg | ORAL_TABLET | ORAL | Status: DC | PRN
  Administered 2024-03-25: 650 mg via ORAL
  Filled 2024-03-24: qty 2

## 2024-03-24 MED ORDER — MISOPROSTOL 50MCG HALF TABLET
50.0000 ug | ORAL_TABLET | ORAL | Status: DC
  Administered 2024-03-24 – 2024-03-25 (×3): 50 ug via BUCCAL
  Filled 2024-03-24 (×3): qty 1

## 2024-03-24 NOTE — Telephone Encounter (Signed)
 Preadmission screen

## 2024-03-24 NOTE — H&P (Signed)
 OBSTETRIC ADMISSION HISTORY AND PHYSICAL  Catherine Nixon is a 23 y.o. female G30P2002 with IUP at [redacted]w[redacted]d (dated by L/9, Estimated Date of Delivery: 03/19/24) presenting for decreased fetal movement. Reported no fetal movement at her NST appt today. Given her GA, she was recommended to be admitted to L&D for induction of labor.   She reports No LOF, no VB, no blurry vision, headaches or peripheral edema, and RUQ pain.    She plans on breast and formula feeding. She request interval BTL for birth control.  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  Past Medical History: Past Medical History:  Diagnosis Date   Abdominal pain, epigastric 09/15/2020   Anemia    Medical history non-contributory    Pregnant     Past Surgical History: Past Surgical History:  Procedure Laterality Date   CHOLECYSTECTOMY N/A 11/15/2020   Procedure: LAPAROSCOPIC CHOLECYSTECTOMY;  Surgeon: Shela Derby, MD;  Location: [facility] OR;  Service: General;  Laterality: N/A;    Obstetrical History: OB History     Gravida  3   Para  2   Term  2   Preterm      AB      Living  2      SAB      IAB      Ectopic      Multiple  0   Live Births  2           Social History Social History   Socioeconomic History   Marital status: Single    Spouse name: Not on file   Number of children: 1   Years of education: Not on file   Highest education level: High school graduate  Occupational History   Occupation: Shipping    Comment: [employer]  Tobacco Use   Smoking status: Never    Passive exposure: Never   Smokeless tobacco: Never  Vaping Use   Vaping status: Never Used  Substance and Sexual Activity   Alcohol use: Never   Drug use: Never   Sexual activity: Yes    Birth control/protection: None  Other Topics Concern   Not on file  Social History Narrative   Not on  file   Social Drivers of Health   Financial Resource Strain: Low Risk  (09/11/2023)   Overall Financial Resource Strain (CARDIA)    Difficulty of Paying Living Expenses: Not hard at all  Food Insecurity: Food Insecurity Present (09/11/2023)   Hunger Vital Sign    Worried About Running Out of Food in the Last Year: Often true    Ran Out of Food in the Last Year: Never true  Transportation Needs: No Transportation Needs (09/11/2023)   PRAPARE - Administrator, Civil Service (Medical): No    Lack of Transportation (Non-Medical): No  Physical Activity: Insufficiently Active (09/11/2023)   Exercise Vital Sign    Days of Exercise per Week: 3 days    Minutes of Exercise per Session: 30 min  Stress: No Stress Concern Present (09/11/2023)   [employer] of Occupational Health - Occupational Stress Questionnaire    Feeling of Stress : Only a little  Social Connections: Moderately Integrated (09/11/2023)   Social Connection and Isolation Panel    Frequency of Communication with Friends and Family: More than three times a week    Frequency of Social Gatherings with Friends and Family: Twice a week  Attends Religious Services: 1 to 4 times per year    Active Member of Clubs or Organizations: No    Attends Banker Meetings: Never    Marital Status: Married    Family History: Family History  Problem Relation Age of Onset   Colon cancer Mother    Diabetes Father    Anxiety disorder Sister    Asthma Neg Hx    Heart disease Neg Hx    Hypertension Neg Hx    Stroke Neg Hx    Esophageal cancer Neg Hx    Pancreatic cancer Neg Hx    Liver disease Neg Hx     Allergies: No Known Allergies  Medications Prior to Admission  Medication Sig Dispense Refill Last Dose/Taking   acetaminophen  (TYLENOL ) 500 MG tablet Take 500 mg by mouth every 6 (six) hours as needed. (Patient not taking: Reported on 03/19/2024)      Blood Pressure Monitor MISC For regular home bp monitoring  during pregnancy 1 each 0    Prenatal Vit-Fe Fumarate-FA (PRENATAL VITAMIN PO) Take by mouth.        Review of Systems  All systems reviewed and negative except as stated in HPI.  Last menstrual period 06/13/2023, currently breastfeeding. General appearance: alert and cooperative Lungs: breathing comfortably on room air Heart: regular rate Abdomen: soft, non-tender; gravid Extremities: no edema of bilateral lower extremities Presentation: cephalic Fetal monitoring: 125/mod/+a/-d Uterine activity: quiet     Prenatal labs: ABO, Rh: O/Positive/-- (12/04 1218) Antibody: Negative (03/19 0833) Rubella: <0.90 (12/04 1218) RPR: Non Reactive (03/19 0833)  HBsAg: Negative (12/04 1218)  HIV: Non Reactive (03/19 0833)  GBS: Negative/-- (05/22 1118)  2 hr Glucola wnl Genetic screening low risk, female Anatomy US  wnl  Prenatal Transfer Tool  Maternal Diabetes: No Genetic Screening: Normal Maternal Ultrasounds/Referrals: Normal Fetal Ultrasounds or other Referrals:  None Maternal Substance Abuse:  No Significant Maternal Medications:  None Significant Maternal Lab Results:  Group B Strep negative Number of Prenatal Visits:greater than 3 verified prenatal visits Other Comments:  None  No results found for this or any previous visit (from the past 24 hours).  Patient Active Problem List   Diagnosis Date Noted   Request for sterilization 12/25/2023   Asymptomatic bacteriuria 10/14/2023   Abnormal chromosomal and genetic finding on antenatal screening mother 10/10/2023   Rubella non-immune status, antepartum 09/12/2023   Encounter for supervision of normal pregnancy, antepartum 09/10/2023   Language barrier affecting health care 09/13/2021    Assessment/Plan:  Catherine Nixon is a 23 y.o. G3P2002 at [redacted]w[redacted]d here for IOL for decreased fetal movmeent  #Labor: IOL discussed in detail with patient  cx FT/thick and posterior  will start IOL with Cytotec  buccal #Pain: Per pt  request  #FWB: Cat I #ID:  GBS neg #MOF: Both #MOC: Interval BTL #Circ:  No  #Rubella non-immune status  [name], MD OB Fellow, Faculty Practice [facility], Center for [facility] 03/24/2024 3:27 PM

## 2024-03-24 NOTE — Progress Notes (Signed)
   NURSE VISIT- NST  SUBJECTIVE:  Catherine Nixon is a 23 y.o. G19P2002 female at [redacted]w[redacted]d, here for a NST for pregnancy complicated by Decreased fetal movement.  She reports decreased  fetal movement, contractions: none, vaginal bleeding: none, membranes: intact.   OBJECTIVE:  BP 115/73 (BP Location: Right Arm, Patient Position: Sitting)   Pulse 81   LMP 06/13/2023   Appears well, no apparent distress  No results found for this or any previous visit (from the past 24 hours).  NST: FHR baseline 130 bpm, Variability: moderate, Accelerations:present, Decelerations:  Absent= Cat 1/reactive Toco: none   ASSESSMENT: G3P2002 at 103w5d with Decreased fetal movement NST reactive  PLAN: EFM strip reviewed by Dr. Mellie Sprinkle   Recommendations: INDUCTION    Catherine Nixon  03/24/2024 12:25 PM

## 2024-03-25 ENCOUNTER — Inpatient Hospital Stay (HOSPITAL_COMMUNITY): Admitting: Anesthesiology

## 2024-03-25 ENCOUNTER — Encounter (HOSPITAL_COMMUNITY): Payer: Self-pay | Admitting: Family Medicine

## 2024-03-25 DIAGNOSIS — O368130 Decreased fetal movements, third trimester, not applicable or unspecified: Secondary | ICD-10-CM | POA: Diagnosis not present

## 2024-03-25 DIAGNOSIS — Z3A00 Weeks of gestation of pregnancy not specified: Secondary | ICD-10-CM | POA: Diagnosis not present

## 2024-03-25 DIAGNOSIS — O480 Post-term pregnancy: Secondary | ICD-10-CM | POA: Diagnosis not present

## 2024-03-25 DIAGNOSIS — Z3A40 40 weeks gestation of pregnancy: Secondary | ICD-10-CM | POA: Diagnosis not present

## 2024-03-25 LAB — RPR: RPR Ser Ql: NONREACTIVE

## 2024-03-25 MED ORDER — TERBUTALINE SULFATE 1 MG/ML IJ SOLN: 0.2500 mg | Freq: Once | INTRAMUSCULAR | Status: DC | PRN

## 2024-03-25 MED ORDER — COCONUT OIL OIL: 1.0000 | TOPICAL_OIL | Status: DC | PRN

## 2024-03-25 MED ORDER — ONDANSETRON HCL 4 MG PO TABS: 4.0000 mg | ORAL_TABLET | ORAL | Status: DC | PRN

## 2024-03-25 MED ORDER — TETANUS-DIPHTH-ACELL PERTUSSIS 5-2.5-18.5 LF-MCG/0.5 IM SUSY: 0.5000 mL | PREFILLED_SYRINGE | Freq: Once | INTRAMUSCULAR | Status: DC

## 2024-03-25 MED ORDER — IBUPROFEN 600 MG PO TABS
600.0000 mg | ORAL_TABLET | Freq: Four times a day (QID) | ORAL | Status: DC
  Administered 2024-03-26 – 2024-03-27 (×7): 600 mg via ORAL
  Filled 2024-03-25 (×7): qty 1

## 2024-03-25 MED ORDER — EPHEDRINE 5 MG/ML INJ: 10.0000 mg | INTRAVENOUS | Status: DC | PRN

## 2024-03-25 MED ORDER — PHENYLEPHRINE 80 MCG/ML (10ML) SYRINGE FOR IV PUSH (FOR BLOOD PRESSURE SUPPORT): 80.0000 ug | PREFILLED_SYRINGE | INTRAVENOUS | Status: DC | PRN

## 2024-03-25 MED ORDER — PRENATAL MULTIVITAMIN CH
1.0000 | ORAL_TABLET | Freq: Every day | ORAL | Status: DC
  Administered 2024-03-26 – 2024-03-27 (×2): 1 via ORAL
  Filled 2024-03-25 (×2): qty 1

## 2024-03-25 MED ORDER — BENZOCAINE-MENTHOL 20-0.5 % EX AERO: 1.0000 | INHALATION_SPRAY | CUTANEOUS | Status: DC | PRN

## 2024-03-25 MED ORDER — ACETAMINOPHEN 325 MG PO TABS: 650.0000 mg | ORAL_TABLET | ORAL | Status: DC | PRN

## 2024-03-25 MED ORDER — BUPIVACAINE HCL (PF) 0.25 % IJ SOLN
INTRAMUSCULAR | Status: DC | PRN
  Administered 2024-03-25 (×2): 10 mL via EPIDURAL

## 2024-03-25 MED ORDER — LACTATED RINGERS IV SOLN: 500.0000 mL | Freq: Once | INTRAVENOUS | Status: DC

## 2024-03-25 MED ORDER — FENTANYL-BUPIVACAINE-NACL 0.5-0.125-0.9 MG/250ML-% EP SOLN
12.0000 mL/h | EPIDURAL | Status: DC | PRN
  Administered 2024-03-25: 12 mL/h via EPIDURAL
  Filled 2024-03-25: qty 250

## 2024-03-25 MED ORDER — OXYTOCIN-SODIUM CHLORIDE 30-0.9 UT/500ML-% IV SOLN
1.0000 m[IU]/min | INTRAVENOUS | Status: DC
  Administered 2024-03-25: 6 m[IU]/min via INTRAVENOUS

## 2024-03-25 MED ORDER — ZOLPIDEM TARTRATE 5 MG PO TABS: 5.0000 mg | ORAL_TABLET | Freq: Every evening | ORAL | Status: DC | PRN

## 2024-03-25 MED ORDER — OXYTOCIN-SODIUM CHLORIDE 30-0.9 UT/500ML-% IV SOLN
1.0000 m[IU]/min | INTRAVENOUS | Status: DC
  Administered 2024-03-25: 2 m[IU]/min via INTRAVENOUS
  Filled 2024-03-25: qty 500

## 2024-03-25 MED ORDER — DIPHENHYDRAMINE HCL 50 MG/ML IJ SOLN: 12.5000 mg | INTRAMUSCULAR | Status: DC | PRN

## 2024-03-25 MED ORDER — DIBUCAINE (PERIANAL) 1 % EX OINT: 1.0000 | TOPICAL_OINTMENT | CUTANEOUS | Status: DC | PRN

## 2024-03-25 MED ORDER — LIDOCAINE HCL (PF) 1 % IJ SOLN
INTRAMUSCULAR | Status: DC | PRN
  Administered 2024-03-25: 5 mL via EPIDURAL
  Administered 2024-03-25: 3 mL via EPIDURAL

## 2024-03-25 MED ORDER — DIPHENHYDRAMINE HCL 25 MG PO CAPS: 25.0000 mg | ORAL_CAPSULE | Freq: Four times a day (QID) | ORAL | Status: DC | PRN

## 2024-03-25 MED ORDER — SIMETHICONE 80 MG PO CHEW: 80.0000 mg | CHEWABLE_TABLET | ORAL | Status: DC | PRN

## 2024-03-25 MED ORDER — ONDANSETRON HCL 4 MG/2ML IJ SOLN: 4.0000 mg | INTRAMUSCULAR | Status: DC | PRN

## 2024-03-25 MED ORDER — SENNOSIDES-DOCUSATE SODIUM 8.6-50 MG PO TABS
2.0000 | ORAL_TABLET | Freq: Every day | ORAL | Status: DC
  Administered 2024-03-26 – 2024-03-27 (×2): 2 via ORAL
  Filled 2024-03-25 (×2): qty 2

## 2024-03-25 MED ORDER — WITCH HAZEL-GLYCERIN EX PADS: 1.0000 | MEDICATED_PAD | CUTANEOUS | Status: DC | PRN

## 2024-03-25 NOTE — Progress Notes (Signed)
 Labor progress note:  Back to reassess pt. More comfortable with epidural, but still having some discomfort with cervical checks. Cat I tracing now. Cx 4.5cm/70%/-2 station with cervix more middle to posterior. Discussed role of AROM and patient provided verbal consent. AROM performed with scant amount of brown fluid. Plan to start Pitocin  1x1.    [name], MD OB Fellow, Faculty Practice [facility], Center for [facility]

## 2024-03-25 NOTE — Progress Notes (Signed)
 LABOR PROGRESS NOTE  Patient Name: Catherine Nixon, female   DOB: 12/28/2000, 23 y.o.  MRN: [id number]  Reviewed tracing. Prolonged deceleration followed by some lates and variables. Always had quick recovery with moderate variability throughout. Borderline tachysystole. Last cytotec  0221. Improved with position changes and fluid bolus. Continue to monitor closely. Consider removal of balloon +/- terb if recurs.  Maud Sorenson, MD

## 2024-03-25 NOTE — Discharge Summary (Shared)
 Postpartum Discharge Summary  Date of Service updated***     Patient Name: Catherine Nixon DOB: 2001-02-19 MRN: [id number]  Date of admission: 03/24/2024 Delivery date:03/25/2024 Delivering provider: PAYNE, SHANTONETTE M Date of discharge: 03/25/2024  Admitting diagnosis: Indication for care in labor or delivery [O75.9] Intrauterine pregnancy: [redacted]w[redacted]d     Secondary diagnosis:  Principal Problem:   Indication for care in labor or delivery  Additional problems:  Patient Active Problem List   Diagnosis Date Noted   Indication for care in labor or delivery 03/24/2024   Request for sterilization 12/25/2023   Asymptomatic bacteriuria 10/14/2023   Abnormal chromosomal and genetic finding on antenatal screening mother 10/10/2023   Rubella non-immune status, antepartum 09/12/2023   Encounter for supervision of normal pregnancy, antepartum 09/10/2023   Language barrier affecting health care 09/13/2021       Discharge diagnosis: {DX.:23714}                                              Post partum procedures:{Postpartum procedures:23558} Augmentation: AROM, Pitocin , and Cytotec  Complications: None  Hospital course: Induction of Labor With Vaginal Delivery   23 y.o. yo G3P3003 at [redacted]w[redacted]d was admitted to the hospital 03/24/2024 for induction of labor.  Indication for induction: Decreased fetal movement.  Patient had an labor course complicated by heavy mec stained fluid  Membrane Rupture Time/Date: 12:08 PM,03/25/2024  Delivery Method:Vaginal, Spontaneous Operative Delivery:N/A Episiotomy: None Lacerations:  Periurethral Details of delivery can be found in separate delivery note.  Patient had a postpartum course complicated by***. Patient is discharged home 03/25/24.  Newborn Data: Birth date:03/25/2024 Birth time:6:15 PM Gender:Female Living status:Living Apgars:9 ,9  Weight:   Magnesium Sulfate received: No BMZ received: No Rhophylac:N/A MMR:Yes non-immune  T-DaP:Given  prenatally Flu: N/A declined  RSV Vaccine received: {RSV:31013} Transfusion:{Transfusion received:30440034}  Immunizations received: Immunization History  Administered Date(s) Administered   Influenza,inj,Quad PF,6+ Mos 09/08/2020   Tdap 03/28/2020, 01/15/2024    Physical exam  Vitals:   03/25/24 1747 03/25/24 1750 03/25/24 1832 03/25/24 1900  BP: 120/81 108/76 126/60 (!) 121/98  Pulse: 78 78 87 (!) 165  Resp:      Temp:      TempSrc:      SpO2:      Weight:      Height:       General: {Exam; general:21111117} Lochia: {Desc; appropriate/inappropriate:30686::appropriate} Uterine Fundus: {Desc; firm/soft:30687} Incision: {Exam; incision:21111123} DVT Evaluation: {Exam; dvt:2111122} Labs: Lab Results  Component Value Date   WBC 6.4 03/24/2024   HGB 9.7 (L) 03/24/2024   HCT 31.1 (L) 03/24/2024   MCV 80.2 03/24/2024   PLT 293 03/24/2024      Latest Ref Rng & Units 01/31/2024   11:11 AM  CMP  Glucose 70 - 99 mg/dL 88   BUN 6 - 20 mg/dL 10   Creatinine 0.63 - 1.00 mg/dL 0.16   Sodium 010 - 932 mmol/L 141   Potassium 3.5 - 5.2 mmol/L 4.2   Chloride 96 - 106 mmol/L 108   CO2 20 - 29 mmol/L 17   Calcium  8.7 - 10.2 mg/dL 9.1   Total Protein 6.0 - 8.5 g/dL 5.7   Total Bilirubin 0.0 - 1.2 mg/dL <3.5   Alkaline Phos 44 - 121 IU/L 176   AST 0 - 40 IU/L 15   ALT 0 - 32 IU/L 13  Edinburgh Score:    11/16/2021   11:14 AM  Edinburgh Postnatal Depression Scale Screening Tool  I have been able to laugh and see the funny side of things. 0  I have looked forward with enjoyment to things. 0  I have blamed myself unnecessarily when things went wrong. 2  I have been anxious or worried for no good reason. 0  I have felt scared or panicky for no good reason. 0  Things have been getting on top of me. 1  I have been so unhappy that I have had difficulty sleeping. 0  I have felt sad or miserable. 0  I have been so unhappy that I have been crying. 0  The thought of harming  myself has occurred to me. 0  Edinburgh Postnatal Depression Scale Total 3      Data saved with a previous flowsheet row definition   No data recorded  After visit meds:  Allergies as of 03/25/2024   No Known Allergies   Med Rec must be completed prior to using this [facility]***        Discharge home in stable condition Infant Feeding: {Baby feeding:23562} Infant Disposition:{CHL IP OB HOME WITH BJYNWG:95621} Discharge instruction: per After Visit Summary and Postpartum booklet. Activity: Advance as tolerated. Pelvic rest for 6 weeks.  Diet: {OB HYQM:57846962} Future Appointments:No future appointments. Follow up Visit:   Please schedule this patient for a In person postpartum visit in 6 weeks with the following provider: Any provider. Additional Postpartum F/U:N/A   Low risk pregnancy complicated by: N/A  Delivery mode:  Vaginal, Spontaneous Anticipated Birth Control:  Plans Interval BTL  Message sent on 03/25/24   03/25/2024 Corie Diamond, CNM

## 2024-03-25 NOTE — Anesthesia Procedure Notes (Signed)
 Epidural Patient location during procedure: OB Start time: 03/25/2024 10:29 AM End time: 03/25/2024 10:36 AM  Staffing Anesthesiologist: Erin Havers, MD Performed: anesthesiologist   Preanesthetic Checklist Completed: patient identified, IV checked, risks and benefits discussed, monitors and equipment checked, pre-op evaluation and timeout performed  Epidural Patient position: sitting Prep: DuraPrep Patient monitoring: blood pressure and continuous pulse ox Approach: midline Location: L3-L4 Injection technique: LOR air  Needle:  Needle type: Tuohy  Needle gauge: 17 G Needle length: 9 cm Needle insertion depth: 7 cm Catheter size: 19 Gauge Catheter at skin depth: 12 cm Test dose: negative and Other (1% Lidocaine )  Additional Notes Patient identified.  Risk benefits discussed including failed block, incomplete pain control, headache, nerve damage, paralysis, blood pressure changes, nausea, vomiting, reactions to medication both toxic or allergic, and postpartum back pain.  Patient expressed understanding and wished to proceed.  All questions were answered.  Sterile technique used throughout procedure and epidural site dressed with sterile barrier dressing. No paresthesia or other complications noted. The patient did not experience any signs of intravascular injection such as tinnitus or metallic taste in mouth nor signs of intrathecal spread such as rapid motor block. Please see nursing notes for vital signs. Reason for block:procedure for pain

## 2024-03-25 NOTE — Progress Notes (Signed)
 Labor progress note:  At bedside to meet patient. Uncomfortable, desires epidural soon. Cat II tracing, mod variability. Had prolonged/variables/lates earlier this AM. Attempted to check cx, patient unable to tolerate. Recommend getting epidural now, then will reassess.   [name], MD OB Fellow, Faculty Practice [facility], Center for [facility]

## 2024-03-25 NOTE — Anesthesia Preprocedure Evaluation (Signed)
 Anesthesia Evaluation  Patient identified by MRN, date of birth, ID band Patient awake  General Assessment Comment:4'9  Reviewed: Allergy & Precautions, NPO status , Patient's Chart, lab work & pertinent test results  Airway Mallampati: II  TM Distance: >3 FB Neck ROM: Full    Dental  (+) Teeth Intact, Dental Advisory Given   Pulmonary neg pulmonary ROS   Pulmonary exam normal breath sounds clear to auscultation       Cardiovascular negative cardio ROS Normal cardiovascular exam Rhythm:Regular Rate:Normal     Neuro/Psych negative neurological ROS     GI/Hepatic negative GI ROS, Neg liver ROS,,,  Endo/Other  Obesity   Renal/GU negative Renal ROS     Musculoskeletal negative musculoskeletal ROS (+)    Abdominal   Peds  Hematology  (+) Blood dyscrasia, anemia Plt 293k    Anesthesia Other Findings Day of surgery medications reviewed with the patient.  Reproductive/Obstetrics (+) Pregnancy                              Anesthesia Physical Anesthesia Plan  ASA: 2  Anesthesia Plan: Epidural   Post-op Pain Management:    Induction:   PONV Risk Score and Plan: 2 and Treatment may vary due to age or medical condition  Airway Management Planned: Natural Airway  Additional Equipment:   Intra-op Plan:   Post-operative Plan:   Informed Consent: I have reviewed the patients History and Physical, chart, labs and discussed the procedure including the risks, benefits and alternatives for the proposed anesthesia with the patient or authorized representative who has indicated his/her understanding and acceptance.     Dental advisory given  Plan Discussed with:   Anesthesia Plan Comments: (Patient identified. Risks/Benefits/Options discussed with patient including but not limited to bleeding, infection, nerve damage, paralysis, failed block, incomplete pain control, headache, blood  pressure changes, nausea, vomiting, reactions to medication both or allergic, itching and postpartum back pain. Confirmed with bedside nurse the patient's most recent platelet count. Confirmed with patient that they are not currently taking any anticoagulation, have any bleeding history or any family history of bleeding disorders. Patient expressed understanding and wished to proceed. All questions were answered. )         Anesthesia Quick Evaluation

## 2024-03-25 NOTE — Progress Notes (Signed)
 LABOR PROGRESS NOTE  Patient Name: Catherine Nixon, female   DOB: 2000/10/24, 23 y.o.  MRN: [id number]  Patient starting to get uncomfortable with contractions. Amenable to balloon placement. Cervix 2.5/50/-2. Fentanyl  pre-procedure per patient request. Discussed R/B/A of FB placement, and verbal consent obtained. Placed Cook without difficulty and balloon filled with 60 cc of saline. Mom and babe tolerated well. Cat I.  Maud Sorenson, MD

## 2024-03-26 ENCOUNTER — Inpatient Hospital Stay (HOSPITAL_COMMUNITY)

## 2024-03-26 LAB — CBC
HCT: 28.1 % — ABNORMAL LOW (ref 36.0–46.0)
Hemoglobin: 8.7 g/dL — ABNORMAL LOW (ref 12.0–15.0)
MCH: 25.1 pg — ABNORMAL LOW (ref 26.0–34.0)
MCHC: 31 g/dL (ref 30.0–36.0)
MCV: 81 fL (ref 80.0–100.0)
Platelets: 235 10*3/uL (ref 150–400)
RBC: 3.47 MIL/uL — ABNORMAL LOW (ref 3.87–5.11)
RDW: 15.7 % — ABNORMAL HIGH (ref 11.5–15.5)
WBC: 11.5 10*3/uL — ABNORMAL HIGH (ref 4.0–10.5)
nRBC: 0 % (ref 0.0–0.2)

## 2024-03-26 NOTE — Progress Notes (Signed)
 Post Partum Day 1 Subjective:  Catherine Nixon is a 23 y.o. [id number] [redacted]w[redacted]d s/p SVD.  No acute events overnight.  Pt denies problems with ambulating, voiding or po intake.  She denies nausea or vomiting.  Pain is well controlled.  She has had flatus.  Lochia Minimal.  Plan for birth control is bilateral tubal ligation- interval  Method of Feeding: breast/formula  Objective: Blood pressure 118/66, pulse 69, temperature 98.5 F (36.9 C), temperature source Oral, resp. rate 16, height 4' 9 (1.448 m), weight 80.5 kg, last menstrual period 06/13/2023, SpO2 100%, unknown if currently breastfeeding.  Physical Exam:  General: alert, cooperative and no distress Lochia:normal flow Chest: normal WOB Heart: Regular rate Abdomen: +BS, soft, mild TTP (appropriate) Uterine Fundus: firm, below umb DVT Evaluation: No evidence of DVT seen on physical exam. Extremities: No edema  Recent Labs    03/24/24 1730 03/26/24 0604  HGB 9.7* 8.7*  HCT 31.1* 28.1*    Assessment/Plan:  ASSESSMENT: Catherine Nixon is a 23 y.o. A5W0981 [redacted]w[redacted]d s/p SVD  Plan for discharge tomorrow Continue routine PP care Breastfeeding support PRN  LOS: 2 days   Abner Ables 03/26/2024, 12:34 PM

## 2024-03-26 NOTE — Patient Instructions (Signed)
 Your appointment with Outpatient Lactation is: Date: June 27 th  Time: 11:30 am [facility] (First Floor) [address] [place]  Check in under baby's name.  Please bring your baby hungry along with your pump and a bottle of either formula or expressed breast milk. Please also bring your pump flanges and we welcome support people! If you need lactation assistance before your appointment, please call [phone] and press 4 for lactation.

## 2024-03-26 NOTE — Anesthesia Postprocedure Evaluation (Signed)
 Anesthesia Post Note  Patient: Remona Donu-Perez  Procedure(s) Performed: AN AD HOC LABOR EPIDURAL     Patient location during evaluation: Mother Baby Anesthesia Type: Epidural Level of consciousness: awake, oriented and awake and alert Pain management: pain level controlled Vital Signs Assessment: post-procedure vital signs reviewed and stable Respiratory status: spontaneous breathing, nonlabored ventilation and respiratory function stable Cardiovascular status: stable Postop Assessment: no headache, patient able to bend at knees, adequate PO intake, able to ambulate and no apparent nausea or vomiting Anesthetic complications: no   No notable events documented.  Last Vitals:  Vitals:   03/26/24 0120 03/26/24 0550  BP: 105/67 92/66  Pulse: 77 67  Resp: 16 15  Temp: 36.8 C 36.7 C  SpO2: 97% 97%    Last Pain:  Vitals:   03/26/24 0550  TempSrc: Oral  PainSc: 3    Pain Goal:                   Ivett Luebbe

## 2024-03-27 MED ORDER — IBUPROFEN 600 MG PO TABS
600.0000 mg | ORAL_TABLET | Freq: Four times a day (QID) | ORAL | 0 refills | Status: DC
Start: 2024-03-27 — End: 2024-04-28

## 2024-03-28 ENCOUNTER — Encounter: Payer: Self-pay | Admitting: Advanced Practice Midwife

## 2024-04-02 ENCOUNTER — Telehealth (HOSPITAL_COMMUNITY): Payer: Self-pay | Admitting: *Deleted

## 2024-04-02 NOTE — Telephone Encounter (Signed)
 04/02/2024  Name: Catherine Nixon MRN: [id number] DOB: 2000/11/01  Reason for Call:  Transition of Care Hospital Discharge Call  Contact Status: Patient Contact Status: Message  Language assistant needed:          Follow-Up Questions:    Van Postnatal Depression Scale:  In the Past 7 Days:    PHQ2-9 Depression Scale:     Discharge Follow-up:    Post-discharge interventions: NA  Ligaya Cormier,RN  04/02/2024 1538

## 2024-04-15 ENCOUNTER — Other Ambulatory Visit: Payer: Self-pay | Admitting: Lactation Services

## 2024-04-15 MED ORDER — METOCLOPRAMIDE HCL 10 MG PO TABS: 10.0000 mg | ORAL_TABLET | Freq: Three times a day (TID) | ORAL | 0 refills | Status: AC

## 2024-04-15 NOTE — Progress Notes (Signed)
 Reglan  ordered for Low milk supply in BF mother per Dr. Suzen Masters. She reports no history of anxiety/depression. Reviewed how to take and when to call for concerns. Reviewed side effects of sleepiness, anxiety, depression and Tardive Dyskinesia.

## 2024-04-20 ENCOUNTER — Encounter: Payer: Self-pay | Admitting: Obstetrics & Gynecology

## 2024-04-20 ENCOUNTER — Ambulatory Visit: Admitting: Obstetrics & Gynecology

## 2024-04-20 VITALS — BP 103/65 | HR 66 | Ht <= 58 in | Wt 160.0 lb

## 2024-04-20 DIAGNOSIS — Z3009 Encounter for other general counseling and advice on contraception: Secondary | ICD-10-CM

## 2024-04-20 MED ORDER — ESCITALOPRAM OXALATE 10 MG PO TABS: 10.0000 mg | ORAL_TABLET | Freq: Every day | ORAL | 1 refills | Status: AC

## 2024-04-20 NOTE — Progress Notes (Signed)
 Follow up appointment: Signed BTL papers 12/25/23  Chief Complaint  Patient presents with   Postpartum Care    Wants BTL    Blood pressure 103/65, pulse 66, height 4' 10 (1.473 m), weight 160 lb (72.6 kg), last menstrual period 06/13/2023, currently breastfeeding.  Desires permanent sterilization Discussed IUD but has had in the past and did not like it Definitely wants tubal, husband also wants to get a vasectomy  Also Edinburgh score of 10, wants to try lower dose lexapro   MEDS ordered this encounter: Meds ordered this encounter  Medications   escitalopram  (LEXAPRO ) 10 MG tablet    Sig: Take 1 tablet (10 mg total) by mouth daily.    Dispense:  30 tablet    Refill:  1    Orders for this encounter: No orders of the defined types were placed in this encounter.   Impression + Management Plan   ICD-10-CM   1. Encounter for consultation for female sterilization  Z30.09    RA BS 05/06/24, BTL papers signed 12/25/23      Follow Up: Return in about 1 month (around 05/22/2024) for Post Op, with Dr Jayne.     All questions were answered.  Past Medical History:  Diagnosis Date   Abdominal pain, epigastric 09/15/2020   Anemia    Medical history non-contributory    Pregnant     Past Surgical History:  Procedure Laterality Date   CHOLECYSTECTOMY N/A 11/15/2020   Procedure: LAPAROSCOPIC CHOLECYSTECTOMY;  Surgeon: Rubin Calamity, MD;  Location: [facility] OR;  Service: General;  Laterality: N/A;    OB History     Gravida  3   Para  3   Term  3   Preterm      AB      Living  3      SAB      IAB      Ectopic      Multiple  0   Live Births  3           No Known Allergies  Social History   Socioeconomic History   Marital status: Married    Spouse name: Luis   Number of children: 2   Years of education: Not on file   Highest education level: High school graduate  Occupational History   Not on file  Tobacco Use   Smoking status: Never    Passive  exposure: Never   Smokeless tobacco: Never  Vaping Use   Vaping status: Never Used  Substance and Sexual Activity   Alcohol use: Never   Drug use: Never   Sexual activity: Yes    Birth control/protection: None  Other Topics Concern   Not on file  Social History Narrative   Not on file   Social Drivers of Health   Financial Resource Strain: Low Risk  (09/11/2023)   Overall Financial Resource Strain (CARDIA)    Difficulty of Paying Living Expenses: Not hard at all  Food Insecurity: No Food Insecurity (03/24/2024)   Hunger Vital Sign    Worried About Running Out of Food in the Last Year: Never true    Ran Out of Food in the Last Year: Never true  Transportation Needs: No Transportation Needs (03/24/2024)   PRAPARE - Administrator, Civil Service (Medical): No    Lack of Transportation (Non-Medical): No  Physical Activity: Insufficiently Active (09/11/2023)   Exercise Vital Sign    Days of Exercise per Week: 3 days    Minutes  of Exercise per Session: 30 min  Stress: No Stress Concern Present (09/11/2023)   [employer] of Occupational Health - Occupational Stress Questionnaire    Feeling of Stress : Only a little  Social Connections: Socially Integrated (03/24/2024)   Social Connection and Isolation Panel    Frequency of Communication with Friends and Family: More than three times a week    Frequency of Social Gatherings with Friends and Family: More than three times a week    Attends Religious Services: More than 4 times per year    Active Member of [employer] or Organizations: Yes    Attends Engineer, structural: More than 4 times per year    Marital Status: Married    Family History  Problem Relation Age of Onset   Colon cancer Mother    Diabetes Father    Anxiety disorder Sister    Asthma Neg Hx    Heart disease Neg Hx    Hypertension Neg Hx    Stroke Neg Hx    Esophageal cancer Neg Hx    Pancreatic cancer Neg Hx    Liver disease Neg Hx

## 2024-04-21 ENCOUNTER — Encounter: Payer: Self-pay | Admitting: Obstetrics & Gynecology

## 2024-04-30 NOTE — Patient Instructions (Signed)
 [facility]  04/30/2024     @PREFPERIOPPHARMACY @   Your procedure is scheduled on  05/06/2024.   Report to [facility] at  0845 A.M.   Call this number if you have problems the morning of surgery:  [phone]  If you experience any cold or flu symptoms such as cough, fever, chills, shortness of breath, etc. between now and your scheduled surgery, please notify us  at the above number.   Remember:  Do not eat after midnight.    You may drink clear liquids until 0645 am on 05/06/2024.      Clear liquids allowed are:                    Water , Juice (No red color; non-citric and without pulp; diabetics please choose diet or no sugar options), Carbonated beverages (diabetics please choose diet or no sugar options), Clear Tea (No creamer, milk, or cream, including half & half and powdered creamer), Black Coffee Only (No creamer, milk or cream, including half & half and powdered creamer), and Clear Sports drink (No red color; diabetics please choose diet or no sugar options)          At 0645 am on 05/06/2024 drink your carb drink. You can have nothing else to drink after this.   Take these medicines the morning of surgery with A SIP OF WATER                                                escitalopram .    Do not wear jewelry, make-up or nail polish, including gel polish,  artificial nails, or any other type of covering on natural nails (fingers and  toes).  Do not wear lotions, powders, or perfumes, or deodorant.  Do not shave 48 hours prior to surgery.  Men may shave face and neck.  Do not bring valuables to the hospital.  [facility] is not responsible for any belongings or valuables.  Contacts, dentures or bridgework may not be worn into surgery.  Leave your suitcase in the car.  After surgery it may be brought to your room.  For patients admitted to the hospital, discharge time will be determined by your treatment team.  Patients discharged the day of surgery will  not be allowed to drive home and must have someone with them for 24 hours.    Special instructions:   DO NOT smoke tobacco or vape for 24 hours before your procedure.  Please read over the following fact sheets that you were given. Coughing and Deep Breathing, Surgical Site Infection Prevention, Anesthesia Post-op Instructions, and Care and Recovery After Surgery      Surgery to Take Out One or Both Fallopian Tubes (Salpingectomy): What to Know After After one or both of your fallopian tubes are taken out, you may have pain in your belly and light bleeding from your vagina for a few days. You may also feel tired for a few days. Your recovery time will depend on the method that was used in your surgery. Follow these instructions at home: Medicines Take your medicines only as told. You may need to take steps to help treat or prevent trouble pooping (constipation), such as: Taking medicines to help you poop. Eating foods high in fiber, like beans, whole grains, and fresh fruits and vegetables. Drinking  more fluids as told. Ask your health care provider if it's safe to drive or use machines while taking your medicine. Caring for your cuts from surgery  Take care of the cuts in your belly as told. Make sure you: Wash your hands with soap and water  for at least 20 seconds before and after you change your bandage. If you can't use soap and water , use hand sanitizer. Change your bandage. Leave stitches, staples, or skin glue alone. Leave tape strips alone unless you're told to take them off. You may trim the edges of the tape strips if they curl up. Check the cuts on your belly every day for signs of infection. Check for: More redness, swelling, or pain. More fluid or blood. Warmth. Pus or a bad smell. Activity Rest as told. Get up to take short walks at least every 2 hours during the day. This helps you breathe better and keeps your blood flowing. Ask for help if you feel weak or  unsteady. Do not lift anything heavier than 10 lb (4.5 kg) until you're told it's OK. Do not take baths, swim, or use a hot tub until you're told it's OK. Ask if you can shower. Ask what things are safe for you to do at home. Ask when you can go back to work or school. General instructions Do not smoke, vape, or use nicotine or tobacco. Wear compression stockings to reduce swelling and help prevent blood clots in your legs. Your provider may give you more instructions. Make sure you know what you can and can't do. Contact a health care provider if: You have pain when you pee. You have symptoms of infection in the cuts in your belly. You have a fever. You have pain in your belly that gets worse or does not get better with medicine. You have a rash. You feel light-headed. You throw up or feel like throwing up. Get help right away if: You have pain in your chest or leg. You have shortness of breath. You faint. You have more bleeding from your vagina that soaks one pad in an hour. These symptoms may be an emergency. Call 911 right away. Do not wait to see if the symptoms will go away. Do not drive yourself to the hospital. This information is not intended to replace advice given to you by your health care provider. Make sure you discuss any questions you have with your health care provider. Document Revised: 05/06/2023 Document Reviewed: 05/06/2023 Elsevier Patient Education  2024 Elsevier Inc.General Anesthesia, Adult, Care After The following information offers guidance on how to care for yourself after your procedure. Your health care provider may also give you more specific instructions. If you have problems or questions, contact your health care provider. What can I expect after the procedure? After the procedure, it is common for people to: Have pain or discomfort at the IV site. Have nausea or vomiting. Have a sore throat or hoarseness. Have trouble concentrating. Feel cold or  chills. Feel weak, sleepy, or tired (fatigue). Have soreness and body aches. These can affect parts of the body that were not involved in surgery. Follow these instructions at home: For the time period you were told by your health care provider:  Rest. Do not participate in activities where you could fall or become injured. Do not drive or use machinery. Do not drink alcohol. Do not take sleeping pills or medicines that cause drowsiness. Do not make important decisions or sign legal documents. Do not take  care of children on your own. General instructions Drink enough fluid to keep your urine pale yellow. If you have sleep apnea, surgery and certain medicines can increase your risk for breathing problems. Follow instructions from your health care provider about wearing your sleep device: Anytime you are sleeping, including during daytime naps. While taking prescription pain medicines, sleeping medicines, or medicines that make you drowsy. Return to your normal activities as told by your health care provider. Ask your health care provider what activities are safe for you. Take over-the-counter and prescription medicines only as told by your health care provider. Do not use any products that contain nicotine or tobacco. These products include cigarettes, chewing tobacco, and vaping devices, such as e-cigarettes. These can delay incision healing after surgery. If you need help quitting, ask your health care provider. Contact a health care provider if: You have nausea or vomiting that does not get better with medicine. You vomit every time you eat or drink. You have pain that does not get better with medicine. You cannot urinate or have bloody urine. You develop a skin rash. You have a fever. Get help right away if: You have trouble breathing. You have chest pain. You vomit blood. These symptoms may be an emergency. Get help right away. Call 911. Do not wait to see if the symptoms will  go away. Do not drive yourself to the hospital. Summary After the procedure, it is common to have a sore throat, hoarseness, nausea, vomiting, or to feel weak, sleepy, or fatigue. For the time period you were told by your health care provider, do not drive or use machinery. Get help right away if you have difficulty breathing, have chest pain, or vomit blood. These symptoms may be an emergency. This information is not intended to replace advice given to you by your health care provider. Make sure you discuss any questions you have with your health care provider. Document Revised: 12/22/2021 Document Reviewed: 12/22/2021 Elsevier Patient Education  2024 Elsevier Inc.How to Use Chlorhexidine  at Home in the Shower Chlorhexidine  gluconate (CHG) is a germ-killing (antiseptic) wash that's used to clean the skin. It can get rid of the germs that normally live on the skin and can keep them away for about 24 hours. If you're having surgery, you may be told to shower with CHG at home the night before surgery. This can help lower your risk for infection. To use CHG wash in the shower, follow the steps below. Supplies needed: CHG body wash. Clean washcloth. Clean towel. How to use CHG in the shower Follow these steps unless you're told to use CHG in a different way: Start the shower. Use your normal soap and shampoo to wash your face and hair. Turn off the shower or move out of the shower stream. Pour CHG onto a clean washcloth. Do not use any type of brush or rough sponge. Start at your neck, washing your body down to your toes. Make sure you: Wash the part of your body where the surgery will be done for at least 1 minute. Do not scrub. Do not use CHG on your head or face unless your health care provider tells you to. If it gets into your ears or eyes, rinse them well with water . Do not wash your genitals with CHG. Wash your back and under your arms. Make sure to wash skin folds. Let the CHG sit on  your skin for 1-2 minutes or as long as told. Rinse your entire body in  the shower, including all body creases and folds. Turn off the shower. Dry off with a clean towel. Do not put anything on your skin afterward, such as powder, lotion, or perfume. Put on clean clothes or pajamas. If it's the night before surgery, sleep in clean sheets. General tips Use CHG only as told, and follow the instructions on the label. Use the full amount of CHG as told. This is often one bottle. Do not smoke and stay away from flames after using CHG. Your skin may feel sticky after using CHG. This is normal. The sticky feeling will go away as the CHG dries. Do not use CHG: If you have a chlorhexidine  allergy or have reacted to chlorhexidine  in the past. On open wounds or areas of skin that have broken skin, cuts, or scrapes. On babies younger than 30 months of age. Contact a health care provider if: You have questions about using CHG. Your skin gets irritated or itchy. You have a rash after using CHG. You swallow any CHG. Call your local poison control center [phone] in the [place] Your eyes itch badly, or they become very red or swollen. Your hearing changes. You have trouble seeing. If you can't reach your provider, go to an urgent care or emergency room. Do not drive yourself. Get help right away if: You have swelling or tingling in your mouth or throat. You make high-pitched whistling sounds when you breathe, most often when you breathe out (wheeze). You have trouble breathing. These symptoms may be an emergency. Call 911 right away. Do not wait to see if the symptoms will go away. Do not drive yourself to the hospital. This information is not intended to replace advice given to you by your health care provider. Make sure you discuss any questions you have with your health care provider. Document Revised: 04/09/2023 Document Reviewed: 04/05/2022 Elsevier Patient Education  2024 [employer].

## 2024-05-03 ENCOUNTER — Other Ambulatory Visit: Payer: Self-pay | Admitting: Obstetrics & Gynecology

## 2024-05-03 DIAGNOSIS — Z01818 Encounter for other preprocedural examination: Secondary | ICD-10-CM

## 2024-05-04 ENCOUNTER — Encounter (HOSPITAL_COMMUNITY): Payer: Self-pay

## 2024-05-04 ENCOUNTER — Encounter (HOSPITAL_COMMUNITY)
Admission: RE | Admit: 2024-05-04 | Discharge: 2024-05-04 | Disposition: A | Discharge status: Home / Self Care | Source: Ambulatory Visit | Attending: Obstetrics & Gynecology | Admitting: Obstetrics & Gynecology

## 2024-05-04 ENCOUNTER — Other Ambulatory Visit: Payer: Self-pay

## 2024-05-04 DIAGNOSIS — Z01818 Encounter for other preprocedural examination: Secondary | ICD-10-CM | POA: Diagnosis present

## 2024-05-04 DIAGNOSIS — Z01812 Encounter for preprocedural laboratory examination: Secondary | ICD-10-CM | POA: Diagnosis not present

## 2024-05-04 LAB — URINALYSIS, ROUTINE W REFLEX MICROSCOPIC
Bacteria, UA: NONE SEEN
Bilirubin Urine: NEGATIVE
Glucose, UA: NEGATIVE mg/dL
Ketones, ur: NEGATIVE mg/dL
Leukocytes,Ua: NEGATIVE
Nitrite: NEGATIVE
Protein, ur: NEGATIVE mg/dL
Specific Gravity, Urine: 1.017 (ref 1.005–1.030)
pH: 5 (ref 5.0–8.0)

## 2024-05-04 LAB — CBC
HCT: 38.1 % (ref 36.0–46.0)
Hemoglobin: 11.9 g/dL — ABNORMAL LOW (ref 12.0–15.0)
MCH: 25.5 pg — ABNORMAL LOW (ref 26.0–34.0)
MCHC: 31.2 g/dL (ref 30.0–36.0)
MCV: 81.6 fL (ref 80.0–100.0)
Platelets: 319 K/uL (ref 150–400)
RBC: 4.67 MIL/uL (ref 3.87–5.11)
RDW: 18.2 % — ABNORMAL HIGH (ref 11.5–15.5)
WBC: 6.2 K/uL (ref 4.0–10.5)
nRBC: 0 % (ref 0.0–0.2)

## 2024-05-04 LAB — RAPID HIV SCREEN (HIV 1/2 AB+AG)
HIV 1/2 Antibodies: NONREACTIVE
HIV-1 P24 Antigen - HIV24: NONREACTIVE

## 2024-05-04 LAB — COMPREHENSIVE METABOLIC PANEL WITH GFR
ALT: 65 U/L — ABNORMAL HIGH (ref 0–44)
AST: 46 U/L — ABNORMAL HIGH (ref 15–41)
Albumin: 3.7 g/dL (ref 3.5–5.0)
Alkaline Phosphatase: 121 U/L (ref 38–126)
Anion gap: 8 (ref 5–15)
BUN: 12 mg/dL (ref 6–20)
CO2: 21 mmol/L — ABNORMAL LOW (ref 22–32)
Calcium: 8.7 mg/dL — ABNORMAL LOW (ref 8.9–10.3)
Chloride: 109 mmol/L (ref 98–111)
Creatinine, Ser: 0.5 mg/dL (ref 0.44–1.00)
GFR, Estimated: >60 mL/min (ref >60)
Glucose, Bld: 92 mg/dL (ref 70–99)
Potassium: 4 mmol/L (ref 3.5–5.1)
Sodium: 138 mmol/L (ref 135–145)
Total Bilirubin: 0.6 mg/dL (ref 0.0–1.2)
Total Protein: 6.8 g/dL (ref 6.5–8.1)

## 2024-05-04 LAB — TYPE AND SCREEN
ABO/RH(D): O POS
Antibody Screen: NEGATIVE

## 2024-05-04 LAB — PREGNANCY, URINE: Preg Test, Ur: NEGATIVE

## 2024-05-06 ENCOUNTER — Ambulatory Visit (HOSPITAL_COMMUNITY): Payer: Self-pay | Admitting: Anesthesiology

## 2024-05-06 ENCOUNTER — Ambulatory Visit (HOSPITAL_BASED_OUTPATIENT_CLINIC_OR_DEPARTMENT_OTHER): Payer: Self-pay | Admitting: Anesthesiology

## 2024-05-06 ENCOUNTER — Ambulatory Visit: Admitting: Obstetrics & Gynecology

## 2024-05-06 ENCOUNTER — Encounter (HOSPITAL_COMMUNITY)
Admission: RE | Disposition: A | Discharge status: Home / Self Care | Payer: Self-pay | Source: Home / Self Care | Attending: Obstetrics & Gynecology

## 2024-05-06 ENCOUNTER — Ambulatory Visit (HOSPITAL_COMMUNITY)
Admission: RE | Admit: 2024-05-06 | Discharge: 2024-05-06 | Disposition: A | Discharge status: Home / Self Care | Attending: Obstetrics & Gynecology | Admitting: Obstetrics & Gynecology

## 2024-05-06 ENCOUNTER — Encounter (HOSPITAL_COMMUNITY): Payer: Self-pay | Admitting: Obstetrics & Gynecology

## 2024-05-06 DIAGNOSIS — Z302 Encounter for sterilization: Secondary | ICD-10-CM

## 2024-05-06 HISTORY — PX: SAPINGECTOMY, ROBOT ASSISTED, LAPAROSCOPIC: SHX7403

## 2024-05-06 SURGERY — SAPINGECTOMY, ROBOT ASSISTED, LAPAROSCOPIC, D5
Anesthesia: General | Site: Abdomen | Laterality: Bilateral

## 2024-05-06 MED ORDER — ACETAMINOPHEN 10 MG/ML IV SOLN
INTRAVENOUS | Status: AC
  Filled 2024-05-06: qty 100

## 2024-05-06 MED ORDER — SUGAMMADEX SODIUM 200 MG/2ML IV SOLN
INTRAVENOUS | Status: DC | PRN
  Administered 2024-05-06: 300 mg via INTRAVENOUS

## 2024-05-06 MED ORDER — SUGAMMADEX SODIUM 200 MG/2ML IV SOLN
INTRAVENOUS | Status: AC
  Filled 2024-05-06: qty 4

## 2024-05-06 MED ORDER — ACETAMINOPHEN 10 MG/ML IV SOLN
INTRAVENOUS | Status: DC | PRN
  Administered 2024-05-06: 1000 mg via INTRAVENOUS

## 2024-05-06 MED ORDER — KETOROLAC TROMETHAMINE 10 MG PO TABS: 10.0000 mg | ORAL_TABLET | Freq: Three times a day (TID) | ORAL | 0 refills | Status: AC | PRN

## 2024-05-06 MED ORDER — MIDAZOLAM HCL 2 MG/2ML IJ SOLN
INTRAMUSCULAR | Status: DC | PRN
  Administered 2024-05-06: 2 mg via INTRAVENOUS

## 2024-05-06 MED ORDER — OXYCODONE HCL 5 MG/5ML PO SOLN: 5.0000 mg | Freq: Once | ORAL | Status: DC | PRN

## 2024-05-06 MED ORDER — FENTANYL CITRATE (PF) 100 MCG/2ML IJ SOLN
INTRAMUSCULAR | Status: AC
  Filled 2024-05-06: qty 2

## 2024-05-06 MED ORDER — CHLORHEXIDINE GLUCONATE 0.12 % MT SOLN: 15.0000 mL | Freq: Once | OROMUCOSAL | Status: DC

## 2024-05-06 MED ORDER — PROPOFOL 10 MG/ML IV BOLUS
INTRAVENOUS | Status: AC
  Filled 2024-05-06: qty 20

## 2024-05-06 MED ORDER — BUPIVACAINE HCL (PF) 0.25 % IJ SOLN
INTRAMUSCULAR | Status: AC
  Filled 2024-05-06: qty 60

## 2024-05-06 MED ORDER — ONDANSETRON 8 MG PO TBDP: 8.0000 mg | ORAL_TABLET | Freq: Three times a day (TID) | ORAL | 0 refills | Status: AC | PRN

## 2024-05-06 MED ORDER — PROPOFOL 10 MG/ML IV BOLUS
INTRAVENOUS | Status: DC | PRN
  Administered 2024-05-06: 200 mg via INTRAVENOUS

## 2024-05-06 MED ORDER — ONDANSETRON HCL 4 MG/2ML IJ SOLN
INTRAMUSCULAR | Status: DC | PRN
  Administered 2024-05-06: 4 mg via INTRAVENOUS

## 2024-05-06 MED ORDER — CEFAZOLIN SODIUM-DEXTROSE 2-4 GM/100ML-% IV SOLN
2.0000 g | INTRAVENOUS | Status: AC
  Administered 2024-05-06: 2 g via INTRAVENOUS

## 2024-05-06 MED ORDER — KETOROLAC TROMETHAMINE 30 MG/ML IJ SOLN
INTRAMUSCULAR | Status: AC
  Administered 2024-05-06: 30 mg via INTRAVENOUS
  Filled 2024-05-06: qty 1

## 2024-05-06 MED ORDER — POVIDONE-IODINE 10 % EX SWAB: 2.0000 | Freq: Once | CUTANEOUS | Status: DC

## 2024-05-06 MED ORDER — DEXAMETHASONE SODIUM PHOSPHATE 10 MG/ML IJ SOLN
INTRAMUSCULAR | Status: DC | PRN
  Administered 2024-05-06: 8 mg via INTRAVENOUS

## 2024-05-06 MED ORDER — PHENYLEPHRINE 80 MCG/ML (10ML) SYRINGE FOR IV PUSH (FOR BLOOD PRESSURE SUPPORT)
PREFILLED_SYRINGE | INTRAVENOUS | Status: DC | PRN
  Administered 2024-05-06 (×2): 80 ug via INTRAVENOUS

## 2024-05-06 MED ORDER — BUPIVACAINE HCL (PF) 0.25 % IJ SOLN
INTRAMUSCULAR | Status: DC | PRN
  Administered 2024-05-06: 40 mL

## 2024-05-06 MED ORDER — STERILE WATER FOR IRRIGATION IR SOLN
Status: DC | PRN
  Administered 2024-05-06: 1000 mL

## 2024-05-06 MED ORDER — OXYCODONE HCL 5 MG PO TABS: 5.0000 mg | ORAL_TABLET | Freq: Once | ORAL | Status: DC | PRN

## 2024-05-06 MED ORDER — ORAL CARE MOUTH RINSE: 15.0000 mL | Freq: Once | OROMUCOSAL | Status: DC

## 2024-05-06 MED ORDER — ROCURONIUM BROMIDE 10 MG/ML (PF) SYRINGE
PREFILLED_SYRINGE | INTRAVENOUS | Status: DC | PRN
Start: 2024-05-06 — End: 2024-05-06
  Administered 2024-05-06: 50 mg via INTRAVENOUS

## 2024-05-06 MED ORDER — DEXAMETHASONE SODIUM PHOSPHATE 10 MG/ML IJ SOLN
INTRAMUSCULAR | Status: AC
  Filled 2024-05-06: qty 1

## 2024-05-06 MED ORDER — HYDROCODONE-ACETAMINOPHEN 5-325 MG PO TABS: 1.0000 | ORAL_TABLET | Freq: Four times a day (QID) | ORAL | 0 refills | Status: AC | PRN

## 2024-05-06 MED ORDER — LACTATED RINGERS IV SOLN: INTRAVENOUS | Status: DC

## 2024-05-06 MED ORDER — CEFAZOLIN SODIUM-DEXTROSE 2-4 GM/100ML-% IV SOLN
INTRAVENOUS | Status: AC
  Filled 2024-05-06: qty 100

## 2024-05-06 MED ORDER — FENTANYL CITRATE PF 50 MCG/ML IJ SOSY
25.0000 ug | PREFILLED_SYRINGE | INTRAMUSCULAR | Status: DC | PRN
  Administered 2024-05-06: 50 ug via INTRAVENOUS
  Filled 2024-05-06: qty 1

## 2024-05-06 MED ORDER — FENTANYL CITRATE (PF) 100 MCG/2ML IJ SOLN
INTRAMUSCULAR | Status: DC | PRN
  Administered 2024-05-06 (×4): 50 ug via INTRAVENOUS

## 2024-05-06 MED ORDER — KETOROLAC TROMETHAMINE 30 MG/ML IJ SOLN: 30.0000 mg | INTRAMUSCULAR | Status: AC

## 2024-05-06 MED ORDER — ONDANSETRON HCL 4 MG/2ML IJ SOLN: 4.0000 mg | Freq: Once | INTRAMUSCULAR | Status: DC | PRN

## 2024-05-06 MED ORDER — ONDANSETRON HCL 4 MG/2ML IJ SOLN
INTRAMUSCULAR | Status: AC
  Filled 2024-05-06: qty 2

## 2024-05-06 MED ORDER — IPRATROPIUM-ALBUTEROL 0.5-2.5 (3) MG/3ML IN SOLN
3.0000 mL | RESPIRATORY_TRACT | Status: DC
  Administered 2024-05-06: 3 mL via RESPIRATORY_TRACT
  Filled 2024-05-06: qty 3

## 2024-05-06 MED ORDER — MIDAZOLAM HCL 2 MG/2ML IJ SOLN
INTRAMUSCULAR | Status: AC
Start: 2024-05-06 — End: 2024-05-06
  Filled 2024-05-06: qty 2

## 2024-05-06 MED ORDER — LIDOCAINE 2% (20 MG/ML) 5 ML SYRINGE
INTRAMUSCULAR | Status: DC | PRN
  Administered 2024-05-06: 60 mg via INTRAVENOUS

## 2024-05-06 SURGICAL SUPPLY — 32 items
BLADE SURG SZ11 CARB STEEL (BLADE) ×1 IMPLANT
COVER LIGHT HANDLE (MISCELLANEOUS) IMPLANT
COVER MAYO STAND XLG (MISCELLANEOUS) ×1 IMPLANT
DERMABOND ADVANCED .7 DNX12 (GAUZE/BANDAGES/DRESSINGS) ×1 IMPLANT
DRAPE ARM DVNC X/XI (DISPOSABLE) ×3 IMPLANT
DRAPE COLUMN DVNC XI (DISPOSABLE) ×1 IMPLANT
ELECTRODE REM PT RTRN 9FT ADLT (ELECTROSURGICAL) ×1 IMPLANT
FORCEPS PROGRASP DVNC XI (FORCEP) ×1 IMPLANT
GLOVE BIOGEL PI IND STRL 7.0 (GLOVE) ×3 IMPLANT
GLOVE BIOGEL PI IND STRL 8 (GLOVE) ×2 IMPLANT
GLOVE ECLIPSE 8.0 STRL XLNG CF (GLOVE) ×2 IMPLANT
GOWN STRL REUS W/TWL LRG LVL3 (GOWN DISPOSABLE) ×2 IMPLANT
GOWN STRL REUS W/TWL XL LVL3 (GOWN DISPOSABLE) ×2 IMPLANT
KIT PINK PAD W/HEAD ARM REST (MISCELLANEOUS) ×1 IMPLANT
KIT TURNOVER KIT A (KITS) ×1 IMPLANT
MANIFOLD NEPTUNE II (INSTRUMENTS) ×1 IMPLANT
NDL HYPO 21X1.5 SAFETY (NEEDLE) ×1 IMPLANT
NDL INSUFFLATION 14GA 120MM (NEEDLE) ×1 IMPLANT
NEEDLE HYPO 21X1.5 SAFETY (NEEDLE) ×1 IMPLANT
NEEDLE INSUFFLATION 14GA 120MM (NEEDLE) ×1 IMPLANT
NS IRRIG 1000ML POUR BTL (IV SOLUTION) ×1 IMPLANT
OBTURATOR OPTICALSTD 8 DVNC (TROCAR) ×1 IMPLANT
PACK LAP CHOLE LZT030E (CUSTOM PROCEDURE TRAY) ×1 IMPLANT
SEAL UNIV 5-12 XI (MISCELLANEOUS) ×3 IMPLANT
SEALER VESSEL EXT DVNC XI (MISCELLANEOUS) ×1 IMPLANT
SET BASIN LINEN APH (SET/KITS/TRAYS/PACK) ×1 IMPLANT
SUT VICRYL 0 UR6 27IN ABS (SUTURE) IMPLANT
SUT VICRYL AB 3-0 FS1 BRD 27IN (SUTURE) ×1 IMPLANT
SYR 20ML LL LF (SYRINGE) ×1 IMPLANT
TAPE TRANSPORE STRL 2 31045 (GAUZE/BANDAGES/DRESSINGS) ×1 IMPLANT
TUBING LAP HI FLOW INSUFFLATIO (TUBING) IMPLANT
WATER STERILE IRR 500ML POUR (IV SOLUTION) ×1 IMPLANT

## 2024-05-06 NOTE — Op Note (Signed)
 Preoperative Diagnosis:  1.  Parous female desires permanent sterilization                                          2.  Elects to have bilateral salpingectomy for ovarian cancer prophylaxis   Postoperative Diagnosis:  Same as above   Procedure:  Robot assisted laparoscopic Bilateral Salpingectomy for the purpose of permanent sterilization                    Placement of a TAP block   Surgeon:  Vonn VEAR Jayne Mickey MD   Anaesthesia: general   Findings:  Patient had normal pelvic anatomy and no intraperitoneal abnormalities.   Description of Operation:  Patient was taken to the OR and placed into supine position where she underwent general anaesthesia.   She was  prepped and draped in the usual sterile fashion.   An incision was made superior to the umbilicus and dissection taken down to the rectus fascia. A Veres needle was used to insufflate the periotneal cavity. An 8 mm non bladed video laparoscope trocar port was then placed under direct visualization without difficulty.   The above noted findings were observed.   Two additional 8 mm non bladed trocar ports were placed to the right and left of the umbilicus lateral to the rectus anterior muscle under direct visualization without difficulty.      The  Robot was docked to the ports 30 degree scope was used in the umbilical port A vessel sealer estended was in the right port A prograsp was placed in the right port VSE was used to hemostatically remove both tubes for the ovarian attachment and to coagulate and cut across the mesosalpinx bilaterally The entire fimbria and Fallopian tube were removed all the way to the uterine cornua The mesosalpinx of both fallopian tubes were hemostatic The removed fallopian tubes were removed through the left 8 mm port using an alligator grasper  A transversus abdominus plane block was placed using laparoscopic guidance at T10 and T7 bilaterally, 10 cc of 0.25% bupivacaine  at each site, 40 cc total volume  and  100 mg total of bupivacaine .  Doyle's bubble technique was used.   The umbilical incision fascia and subcutaneous tissue was reapproximated with 2-0 Vicryl All 3 skin incisions were closed using 3-0 vicryl in a subcuticular fashion.  Dermabond was applied to each incision    The patient was awakened from anaesthesia and taken to the PACU with all counts being correct x 3.     The patient received  2 gram of ancef  andToradol 30 mg IV preoperatively.   Blood loss was 0 cc    Vonn VEAR Jayne 05/06/2024 3:17 PM

## 2024-05-06 NOTE — Anesthesia Procedure Notes (Signed)
 Procedure Name: Intubation Date/Time: 05/06/2024 2:37 PM  Performed by: Para Jerelene CROME, CRNAPre-anesthesia Checklist: Patient identified, Emergency Drugs available, Suction available and Patient being monitored Patient Re-evaluated:Patient Re-evaluated prior to induction Oxygen Delivery Method: Circle system utilized Preoxygenation: Pre-oxygenation with 100% oxygen Induction Type: IV induction Laryngoscope Size: Mac and 3 Grade View: Grade II Tube type: Oral Tube size: 7.0 mm Number of attempts: 1 Airway Equipment and Method: Stylet Placement Confirmation: ETT inserted through vocal cords under direct vision, positive ETCO2, CO2 detector and breath sounds checked- equal and bilateral Secured at: 22 cm Tube secured with: Tape Dental Injury: Teeth and Oropharynx as per pre-operative assessment  Comments: Atraumatic intubation. Lips and teeth remain in preoperative condition.

## 2024-05-06 NOTE — Anesthesia Preprocedure Evaluation (Signed)
 Anesthesia Evaluation  Patient identified by MRN, date of birth, ID band Patient awake    Reviewed: Allergy & Precautions, H&P , NPO status , Patient's Chart, lab work & pertinent test results, reviewed documented beta blocker date and time   Airway Mallampati: II  TM Distance: >3 FB Neck ROM: full    Dental no notable dental hx.    Pulmonary neg pulmonary ROS   Pulmonary exam normal breath sounds clear to auscultation       Cardiovascular Exercise Tolerance: Good hypertension, negative cardio ROS  Rhythm:regular Rate:Normal     Neuro/Psych negative neurological ROS  negative psych ROS   GI/Hepatic negative GI ROS, Neg liver ROS,,,  Endo/Other  negative endocrine ROS    Renal/GU negative Renal ROS  negative genitourinary   Musculoskeletal   Abdominal   Peds  Hematology  (+) Blood dyscrasia, anemia   Anesthesia Other Findings   Reproductive/Obstetrics negative OB ROS                             Anesthesia Physical Anesthesia Plan  ASA: 2  Anesthesia Plan: General   Post-op Pain Management:    Induction:   PONV Risk Score and Plan: Propofol infusion  Airway Management Planned:   Additional Equipment:   Intra-op Plan:   Post-operative Plan:   Informed Consent: I have reviewed the patients History and Physical, chart, labs and discussed the procedure including the risks, benefits and alternatives for the proposed anesthesia with the patient or authorized representative who has indicated his/her understanding and acceptance.     Dental Advisory Given  Plan Discussed with: CRNA  Anesthesia Plan Comments:        Anesthesia Quick Evaluation

## 2024-05-06 NOTE — Transfer of Care (Signed)
 Immediate Anesthesia Transfer of Care Note  Patient: [facility]  Procedure(s) Performed: SAPINGECTOMY, ROBOT ASSISTED, LAPAROSCOPIC, D5 (Bilateral: Abdomen)  Patient Location: PACU  Anesthesia Type:General  Level of Consciousness: awake and patient cooperative  Airway & Oxygen Therapy: Patient Spontanous Breathing and Patient connected to nasal cannula oxygen  Post-op Assessment: Report given to RN and Post -op Vital signs reviewed and stable  Post vital signs: Reviewed and stable  Last Vitals:  Vitals Value Taken Time  BP 114/68 05/06/24 15:36  Temp 98 05/06/24   1536  Pulse 80 05/06/24 15:43  Resp 22 05/06/24 15:43  SpO2 100 % 05/06/24 15:43  Vitals shown include unfiled device data.  Last Pain:  Vitals:   05/06/24 0938  TempSrc:   PainSc: 0-No pain      Patients Stated Pain Goal: 5 (05/06/24 0936)  Complications: No notable events documented.

## 2024-05-06 NOTE — H&P (Signed)
 Preoperative History and Physical  Catherine Nixon is a 23 y.o. [phone] with Patient's last menstrual period was 06/13/2023. admitted for a robot assisted laparoscopic  bilateral salpingectomy.    PMH:    Past Medical History:  Diagnosis Date   Abdominal pain, epigastric 09/15/2020   Anemia    Medical history non-contributory    Pregnant     PSH:     Past Surgical History:  Procedure Laterality Date   CHOLECYSTECTOMY N/A 11/15/2020   Procedure: LAPAROSCOPIC CHOLECYSTECTOMY;  Surgeon: Rubin Calamity, MD;  Location: [facility] OR;  Service: General;  Laterality: N/A;    POb/GynH:      OB History     Gravida  3   Para  3   Term  3   Preterm      AB      Living  3      SAB      IAB      Ectopic      Multiple  0   Live Births  3           SH:   Social History   Tobacco Use   Smoking status: Never    Passive exposure: Never   Smokeless tobacco: Never  Vaping Use   Vaping status: Never Used  Substance Use Topics   Alcohol use: Never   Drug use: Never    FH:    Family History  Problem Relation Age of Onset   Colon cancer Mother    Diabetes Father    Anxiety disorder Sister    Asthma Neg Hx    Heart disease Neg Hx    Hypertension Neg Hx    Stroke Neg Hx    Esophageal cancer Neg Hx    Pancreatic cancer Neg Hx    Liver disease Neg Hx      Allergies: No Known Allergies  Medications:       Current Facility-Administered Medications:    ceFAZolin  (ANCEF ) 2-4 GM/100ML-% IVPB, , , ,    ceFAZolin  (ANCEF ) IVPB 2g/100 mL premix, 2 g, Intravenous, On Call to OR, Jayne Vonn DEL, MD   chlorhexidine  (PERIDEX ) 0.12 % solution 15 mL, 15 mL, Mouth/Throat, Once **OR** Oral care mouth rinse, 15 mL, Mouth Rinse, Once, Kiel, Yvonna PARAS, MD   lactated ringers  infusion, , Intravenous, Continuous, Kendell, Yvonna PARAS, MD, Last Rate: 10 mL/hr at 05/06/24 0938, New Bag at 05/06/24 9061   povidone-iodine  10 % swab 2 Application, 2 Application, Topical, Once, Prudy Candy, Vonn DEL,  MD   sterile water  for irrigation, , , PRN, Jayne Vonn DEL, MD, 1,000 mL at 05/06/24 1315  Review of Systems:   Review of Systems  Constitutional: Negative for fever, chills, weight loss, malaise/fatigue and diaphoresis.  HENT: Negative for hearing loss, ear pain, nosebleeds, congestion, sore throat, neck pain, tinnitus and ear discharge.   Eyes: Negative for blurred vision, double vision, photophobia, pain, discharge and redness.  Respiratory: Negative for cough, hemoptysis, sputum production, shortness of breath, wheezing and stridor.   Cardiovascular: Negative for chest pain, palpitations, orthopnea, claudication, leg swelling and PND.  Gastrointestinal: Positive for abdominal pain. Negative for heartburn, nausea, vomiting, diarrhea, constipation, blood in stool and melena.  Genitourinary: Negative for dysuria, urgency, frequency, hematuria and flank pain.  Musculoskeletal: Negative for myalgias, back pain, joint pain and falls.  Skin: Negative for itching and rash.  Neurological: Negative for dizziness, tingling, tremors, sensory change, speech change, focal weakness, seizures, loss of consciousness, weakness and headaches.  Endo/Heme/Allergies: Negative  for environmental allergies and polydipsia. Does not bruise/bleed easily.  Psychiatric/Behavioral: Negative for depression, suicidal ideas, hallucinations, memory loss and substance abuse. The patient is not nervous/anxious and does not have insomnia.      PHYSICAL EXAM:  Blood pressure (!) 103/58, pulse (!) 58, temperature 98.2 F (36.8 C), temperature source Oral, resp. rate 14, height 4' 10 (1.473 m), weight 72.6 kg, last menstrual period 06/13/2023, SpO2 100%, currently breastfeeding.    Vitals reviewed. Constitutional: She is oriented to person, place, and time. She appears well-developed and well-nourished.  HENT:  Head: Normocephalic and atraumatic.  Right Ear: External ear normal.  Left Ear: External ear normal.  Nose:  Nose normal.  Mouth/Throat: Oropharynx is clear and moist.  Eyes: Conjunctivae and EOM are normal. Pupils are equal, round, and reactive to light. Right eye exhibits no discharge. Left eye exhibits no discharge. No scleral icterus.  Neck: Normal range of motion. Neck supple. No tracheal deviation present. No thyromegaly present.  Cardiovascular: Normal rate, regular rhythm, normal heart sounds and intact distal pulses.  Exam reveals no gallop and no friction rub.   No murmur heard. Respiratory: Effort normal and breath sounds normal. No respiratory distress. She has no wheezes. She has no rales. She exhibits no tenderness.  GI: Soft. Bowel sounds are normal. She exhibits no distension and no mass. There is tenderness. There is no rebound and no guarding.  Genitourinary:       Vulva is normal without lesions Vagina is pink moist without discharge Cervix normal in appearance and pap is normal Uterus is normal size, contour, position, consistency, mobility, non-tender Adnexa is negative with normal sized ovaries by sonogram  Musculoskeletal: Normal range of motion. She exhibits no edema and no tenderness.  Neurological: She is alert and oriented to person, place, and time. She has normal reflexes. She displays normal reflexes. No cranial nerve deficit. She exhibits normal muscle tone. Coordination normal.  Skin: Skin is warm and dry. No rash noted. No erythema. No pallor.  Psychiatric: She has a normal mood and affect. Her behavior is normal. Judgment and thought content normal.    Labs: Results for orders placed or performed during the hospital encounter of 05/04/24 (from the past 2 weeks)  CBC   Collection Time: 05/04/24 10:48 AM  Result Value Ref Range   WBC 6.2 4.0 - 10.5 K/uL   RBC 4.67 3.87 - 5.11 MIL/uL   Hemoglobin 11.9 (L) 12.0 - 15.0 g/dL   HCT 61.8 63.9 - 53.9 %   MCV 81.6 80.0 - 100.0 fL   MCH 25.5 (L) 26.0 - 34.0 pg   MCHC 31.2 30.0 - 36.0 g/dL   RDW 81.7 (H) 88.4 - 84.4 %    Platelets 319 150 - 400 K/uL   nRBC 0.0 0.0 - 0.2 %  Comprehensive metabolic panel   Collection Time: 05/04/24 10:48 AM  Result Value Ref Range   Sodium 138 135 - 145 mmol/L   Potassium 4.0 3.5 - 5.1 mmol/L   Chloride 109 98 - 111 mmol/L   CO2 21 (L) 22 - 32 mmol/L   Glucose, Bld 92 70 - 99 mg/dL   BUN 12 6 - 20 mg/dL   Creatinine, Ser 9.49 0.44 - 1.00 mg/dL   Calcium  8.7 (L) 8.9 - 10.3 mg/dL   Total Protein 6.8 6.5 - 8.1 g/dL   Albumin 3.7 3.5 - 5.0 g/dL   AST 46 (H) 15 - 41 U/L   ALT 65 (H) 0 - 44 U/L  Alkaline Phosphatase 121 38 - 126 U/L   Total Bilirubin 0.6 0.0 - 1.2 mg/dL   GFR, Estimated >39 >39 mL/min   Anion gap 8 5 - 15  Rapid HIV screen (HIV 1/2 Ab+Ag)   Collection Time: 05/04/24 10:48 AM  Result Value Ref Range   HIV-1 P24 Antigen - HIV24 NON REACTIVE NON REACTIVE   HIV 1/2 Antibodies NON REACTIVE NON REACTIVE   Interpretation (HIV Ag Ab)      A non reactive test result means that HIV 1 or HIV 2 antibodies and HIV 1 p24 antigen were not detected in the specimen.  Pregnancy, urine   Collection Time: 05/04/24 10:48 AM  Result Value Ref Range   Preg Test, Ur NEGATIVE NEGATIVE  Type and screen   Collection Time: 05/04/24 10:48 AM  Result Value Ref Range   ABO/RH(D) O POS    Antibody Screen NEG    Sample Expiration      05/18/2024,2359 Performed at [facility], [address], [place], [place] [address]   Urinalysis, Routine w reflex microscopic -Urine, Clean Catch   Collection Time: 05/04/24 10:53 AM  Result Value Ref Range   Color, Urine YELLOW YELLOW   APPearance CLEAR CLEAR   Specific Gravity, Urine 1.017 1.005 - 1.030   pH 5.0 5.0 - 8.0   Glucose, UA NEGATIVE NEGATIVE mg/dL   Hgb urine dipstick MODERATE (A) NEGATIVE   Bilirubin Urine NEGATIVE NEGATIVE   Ketones, ur NEGATIVE NEGATIVE mg/dL   Protein, ur NEGATIVE NEGATIVE mg/dL   Nitrite NEGATIVE NEGATIVE   Leukocytes,Ua NEGATIVE NEGATIVE   RBC / HPF 0-5 0 - 5 RBC/hpf   WBC, UA 0-5 0 - 5 WBC/hpf    Bacteria, UA NONE SEEN NONE SEEN   Squamous Epithelial / HPF 0-5 0 - 5 /HPF   Mucus PRESENT     EKG: No orders found for this or any previous visit.  Imaging Studies: No results found.    Assessment: Multiparous femaled desires permanent sterilization  Plan: RA LBS  Vonn VEAR Inch 05/06/2024 2:23 PM

## 2024-05-06 NOTE — Progress Notes (Signed)
 Patient updated as to the reason for the delay for her surgery start. Patient informed previous OR case has ran over and they are waiting for the MD to complete the procedure before her surgery can begin.  Offered to update any family, as no one is at the bedside, but patient declined.  Patient is content, quiet, calm, playing on her phone.

## 2024-05-07 ENCOUNTER — Encounter (HOSPITAL_COMMUNITY): Payer: Self-pay | Admitting: Obstetrics & Gynecology

## 2024-05-08 LAB — SURGICAL PATHOLOGY

## 2024-05-08 NOTE — Anesthesia Postprocedure Evaluation (Signed)
 Anesthesia Post Note  Patient: Catherine Nixon  Procedure(s) Performed: SAPINGECTOMY, ROBOT ASSISTED, LAPAROSCOPIC, D5 (Bilateral: Abdomen)  Patient location during evaluation: Phase II Anesthesia Type: General Level of consciousness: awake Pain management: pain level controlled Vital Signs Assessment: post-procedure vital signs reviewed and stable Respiratory status: spontaneous breathing and respiratory function stable Cardiovascular status: blood pressure returned to baseline and stable Postop Assessment: no headache and no apparent nausea or vomiting Anesthetic complications: no Comments: Late entry   No notable events documented.   Last Vitals:  Vitals:   05/06/24 1615 05/06/24 1622  BP: 114/71 108/61  Pulse: 92 72  Resp: 17 16  Temp:  37.1 C  SpO2: 100% 100%    Last Pain:  Vitals:   05/06/24 1622  TempSrc: Oral  PainSc: 5                  Yvonna JINNY Bosworth

## 2024-05-21 ENCOUNTER — Encounter: Payer: Self-pay | Admitting: Obstetrics & Gynecology

## 2024-05-21 ENCOUNTER — Ambulatory Visit: Admitting: Obstetrics & Gynecology

## 2024-05-21 VITALS — BP 105/69 | HR 74 | Ht <= 58 in | Wt 160.0 lb

## 2024-05-21 DIAGNOSIS — Z9889 Other specified postprocedural states: Secondary | ICD-10-CM

## 2024-05-21 NOTE — Progress Notes (Signed)
  HPI: Patient returns for routine postoperative follow-up having undergone RA BS7/30/25 on .  The patient's immediate postoperative recovery has been unremarkable. Since hospital discharge the patient reports no problems.   Current Outpatient Medications: escitalopram  (LEXAPRO ) 10 MG tablet, Take 1 tablet (10 mg total) by mouth daily., Disp: 30 tablet, Rfl: 1 metoCLOPramide  (REGLAN ) 10 MG tablet, Take 1 tablet (10 mg total) by mouth 3 (three) times daily before meals. Take 3 times a day for 30 days,Then take 2 a day for 4 days, Then take 1 a day for 4 days, Then stop, Disp: 102 tablet, Rfl: 0 Prenatal Vit-Fe Fumarate-FA (PRENATAL VITAMIN PO), Take 1 tablet by mouth daily., Disp: , Rfl:  HYDROcodone -acetaminophen  (NORCO/VICODIN) 5-325 MG tablet, Take 1 tablet by mouth every 6 (six) hours as needed. (Patient not taking: Reported on 05/21/2024), Disp: 15 tablet, Rfl: 0 ketorolac  (TORADOL ) 10 MG tablet, Take 1 tablet (10 mg total) by mouth every 8 (eight) hours as needed. (Patient not taking: Reported on 05/21/2024), Disp: 15 tablet, Rfl: 0 ondansetron  (ZOFRAN -ODT) 8 MG disintegrating tablet, Take 1 tablet (8 mg total) by mouth every 8 (eight) hours as needed for nausea or vomiting. (Patient not taking: Reported on 05/21/2024), Disp: 8 tablet, Rfl: 0  No current facility-administered medications for this visit.    Blood pressure 105/69, pulse 74, height 4' 10 (1.473 m), weight 160 lb (72.6 kg), last menstrual period 06/13/2023, currently breastfeeding.  Physical Exam: Well healed incisions  Diagnostic Tests:   Pathology: benign  Impression + Management plan:   ICD-10-CM   1. Post-operative state  Z98.890           Medications Prescribed this encounter: No orders of the defined types were placed in this encounter.     Follow up: prn   Vonn VEAR Inch, MD Attending Physician for the [facility] 05/21/2024 5:07 PM
